# Patient Record
Sex: Female | Born: 1960 | Race: Black or African American | Hispanic: No | Marital: Single | Smoking: Never smoker
Health system: Southern US, Community
[De-identification: ages and names within clinical notes are randomized; demographics above are authoritative.]

## PROBLEM LIST (undated history)

## (undated) DIAGNOSIS — Z923 Personal history of irradiation: Secondary | ICD-10-CM

## (undated) DIAGNOSIS — T8859XA Other complications of anesthesia, initial encounter: Secondary | ICD-10-CM

## (undated) DIAGNOSIS — C801 Malignant (primary) neoplasm, unspecified: Secondary | ICD-10-CM

## (undated) DIAGNOSIS — R112 Nausea with vomiting, unspecified: Secondary | ICD-10-CM

## (undated) DIAGNOSIS — K219 Gastro-esophageal reflux disease without esophagitis: Secondary | ICD-10-CM

## (undated) DIAGNOSIS — I890 Lymphedema, not elsewhere classified: Secondary | ICD-10-CM

## (undated) DIAGNOSIS — R0600 Dyspnea, unspecified: Secondary | ICD-10-CM

## (undated) DIAGNOSIS — Z9221 Personal history of antineoplastic chemotherapy: Secondary | ICD-10-CM

## (undated) DIAGNOSIS — F32A Depression, unspecified: Secondary | ICD-10-CM

## (undated) DIAGNOSIS — G43909 Migraine, unspecified, not intractable, without status migrainosus: Secondary | ICD-10-CM

## (undated) DIAGNOSIS — C50912 Malignant neoplasm of unspecified site of left female breast: Secondary | ICD-10-CM

## (undated) DIAGNOSIS — T4145XA Adverse effect of unspecified anesthetic, initial encounter: Secondary | ICD-10-CM

## (undated) DIAGNOSIS — F419 Anxiety disorder, unspecified: Secondary | ICD-10-CM

## (undated) DIAGNOSIS — Z9889 Other specified postprocedural states: Secondary | ICD-10-CM

## (undated) DIAGNOSIS — G4730 Sleep apnea, unspecified: Secondary | ICD-10-CM

## (undated) DIAGNOSIS — T7840XA Allergy, unspecified, initial encounter: Secondary | ICD-10-CM

## (undated) DIAGNOSIS — I10 Essential (primary) hypertension: Secondary | ICD-10-CM

## (undated) DIAGNOSIS — G5603 Carpal tunnel syndrome, bilateral upper limbs: Secondary | ICD-10-CM

## (undated) HISTORY — PX: TUBAL LIGATION: SHX77

## (undated) HISTORY — PX: CARPAL TUNNEL RELEASE: SHX101

## (undated) HISTORY — PX: BREAST LUMPECTOMY: SHX2

## (undated) HISTORY — DX: Depression, unspecified: F32.A

## (undated) HISTORY — DX: Malignant neoplasm of unspecified site of left female breast: C50.912

## (undated) HISTORY — DX: Allergy, unspecified, initial encounter: T78.40XA

## (undated) HISTORY — DX: Anxiety disorder, unspecified: F41.9

## (undated) HISTORY — DX: Morbid (severe) obesity due to excess calories: E66.01

## (undated) HISTORY — PX: BREAST SURGERY: SHX581

## (undated) HISTORY — PX: COLONOSCOPY: SHX174

## (undated) HISTORY — DX: Essential (primary) hypertension: I10

---
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
## 1986-02-17 HISTORY — PX: TUBAL LIGATION: SHX77

## 2003-02-18 DIAGNOSIS — C801 Malignant (primary) neoplasm, unspecified: Secondary | ICD-10-CM

## 2003-02-18 HISTORY — PX: BREAST LUMPECTOMY: SHX2

## 2003-02-18 HISTORY — DX: Malignant (primary) neoplasm, unspecified: C80.1

## 2004-02-18 DIAGNOSIS — Z923 Personal history of irradiation: Secondary | ICD-10-CM

## 2004-02-18 HISTORY — DX: Personal history of irradiation: Z92.3

## 2016-12-26 ENCOUNTER — Emergency Department (HOSPITAL_COMMUNITY)
Admission: EM | Admit: 2016-12-26 | Discharge: 2016-12-26 | Disposition: A | Discharge status: Home / Self Care | Payer: Self-pay | Attending: Emergency Medicine | Admitting: Emergency Medicine

## 2016-12-26 ENCOUNTER — Encounter (HOSPITAL_COMMUNITY): Payer: Self-pay | Admitting: Emergency Medicine

## 2016-12-26 ENCOUNTER — Emergency Department (HOSPITAL_COMMUNITY): Payer: Self-pay

## 2016-12-26 ENCOUNTER — Other Ambulatory Visit: Payer: Self-pay

## 2016-12-26 DIAGNOSIS — Z79899 Other long term (current) drug therapy: Secondary | ICD-10-CM | POA: Insufficient documentation

## 2016-12-26 DIAGNOSIS — J111 Influenza due to unidentified influenza virus with other respiratory manifestations: Secondary | ICD-10-CM

## 2016-12-26 DIAGNOSIS — Z853 Personal history of malignant neoplasm of breast: Secondary | ICD-10-CM | POA: Insufficient documentation

## 2016-12-26 DIAGNOSIS — R69 Illness, unspecified: Secondary | ICD-10-CM | POA: Insufficient documentation

## 2016-12-26 DIAGNOSIS — J029 Acute pharyngitis, unspecified: Secondary | ICD-10-CM | POA: Insufficient documentation

## 2016-12-26 HISTORY — DX: Malignant (primary) neoplasm, unspecified: C80.1

## 2016-12-26 HISTORY — DX: Migraine, unspecified, not intractable, without status migrainosus: G43.909

## 2016-12-26 LAB — COMPREHENSIVE METABOLIC PANEL
ALT: 29 U/L (ref 14–54)
AST: 20 U/L (ref 15–41)
Albumin: 3.7 g/dL (ref 3.5–5.0)
Alkaline Phosphatase: 84 U/L (ref 38–126)
Anion gap: 8 (ref 5–15)
BUN: 10 mg/dL (ref 6–20)
CHLORIDE: 106 mmol/L (ref 101–111)
CO2: 25 mmol/L (ref 22–32)
Calcium: 9.1 mg/dL (ref 8.9–10.3)
Creatinine, Ser: 0.79 mg/dL (ref 0.44–1.00)
GFR calc Af Amer: >60 mL/min (ref >60)
GFR calc non Af Amer: >60 mL/min (ref >60)
GLUCOSE: 121 mg/dL — AB (ref 65–99)
Potassium: 3.4 mmol/L — ABNORMAL LOW (ref 3.5–5.1)
SODIUM: 139 mmol/L (ref 135–145)
Total Bilirubin: 0.9 mg/dL (ref 0.3–1.2)
Total Protein: 7.7 g/dL (ref 6.5–8.1)

## 2016-12-26 LAB — CBC WITH DIFFERENTIAL/PLATELET
Basophils Absolute: 0 10*3/uL (ref 0.0–0.1)
Basophils Relative: 0 %
EOS PCT: 0 %
Eosinophils Absolute: 0 10*3/uL (ref 0.0–0.7)
HCT: 39.4 % (ref 36.0–46.0)
Hemoglobin: 13.3 g/dL (ref 12.0–15.0)
LYMPHS ABS: 1.3 10*3/uL (ref 0.7–4.0)
Lymphocytes Relative: 7 %
MCH: 29.4 pg (ref 26.0–34.0)
MCHC: 33.8 g/dL (ref 30.0–36.0)
MCV: 87.2 fL (ref 78.0–100.0)
MONO ABS: 1.2 10*3/uL — AB (ref 0.1–1.0)
MONOS PCT: 6 %
Neutro Abs: 16.3 10*3/uL — ABNORMAL HIGH (ref 1.7–7.7)
Neutrophils Relative %: 87 %
PLATELETS: 165 10*3/uL (ref 150–400)
RBC: 4.52 MIL/uL (ref 3.87–5.11)
RDW: 13.6 % (ref 11.5–15.5)
WBC: 18.8 10*3/uL — ABNORMAL HIGH (ref 4.0–10.5)

## 2016-12-26 LAB — URINALYSIS, ROUTINE W REFLEX MICROSCOPIC
Bilirubin Urine: NEGATIVE
Glucose, UA: NEGATIVE mg/dL
KETONES UR: 5 mg/dL — AB
Leukocytes, UA: NEGATIVE
Nitrite: NEGATIVE
Protein, ur: 30 mg/dL — AB
SPECIFIC GRAVITY, URINE: 1.025 (ref 1.005–1.030)
pH: 5 (ref 5.0–8.0)

## 2016-12-26 LAB — I-STAT CG4 LACTIC ACID, ED: LACTIC ACID, VENOUS: 0.69 mmol/L (ref 0.5–1.9)

## 2016-12-26 MED ORDER — CLINDAMYCIN HCL 300 MG PO CAPS: 300.0000 mg | ORAL_CAPSULE | Freq: Three times a day (TID) | ORAL | 0 refills | Status: AC

## 2016-12-26 MED ORDER — SODIUM CHLORIDE 0.9 % IV BOLUS (SEPSIS)
1000.0000 mL | Freq: Once | INTRAVENOUS | Status: AC
  Administered 2016-12-26: 1000 mL via INTRAVENOUS

## 2016-12-26 MED ORDER — KETOROLAC TROMETHAMINE 15 MG/ML IJ SOLN
15.0000 mg | Freq: Once | INTRAMUSCULAR | Status: AC
  Administered 2016-12-26: 15 mg via INTRAVENOUS
  Filled 2016-12-26: qty 1

## 2016-12-26 MED ORDER — HYDROCODONE-ACETAMINOPHEN 7.5-325 MG/15ML PO SOLN: 10.0000 mL | Freq: Four times a day (QID) | ORAL | 0 refills | Status: AC | PRN

## 2016-12-26 MED ORDER — DEXAMETHASONE SODIUM PHOSPHATE 10 MG/ML IJ SOLN
10.0000 mg | Freq: Once | INTRAMUSCULAR | Status: AC
  Administered 2016-12-26: 10 mg via INTRAVENOUS
  Filled 2016-12-26: qty 1

## 2016-12-26 MED ORDER — ONDANSETRON 4 MG PO TBDP: 4.0000 mg | ORAL_TABLET | Freq: Three times a day (TID) | ORAL | 0 refills | Status: DC | PRN

## 2016-12-26 MED ORDER — IBUPROFEN 400 MG PO TABS: 400.0000 mg | ORAL_TABLET | Freq: Three times a day (TID) | ORAL | 0 refills | Status: DC | PRN

## 2016-12-26 MED ORDER — POTASSIUM CHLORIDE CRYS ER 20 MEQ PO TBCR
40.0000 meq | EXTENDED_RELEASE_TABLET | Freq: Once | ORAL | Status: AC
  Administered 2016-12-26: 40 meq via ORAL
  Filled 2016-12-26: qty 2

## 2016-12-26 MED ORDER — CLINDAMYCIN PHOSPHATE 900 MG/50ML IV SOLN
900.0000 mg | Freq: Once | INTRAVENOUS | Status: AC
  Administered 2016-12-26: 900 mg via INTRAVENOUS
  Filled 2016-12-26: qty 50

## 2016-12-26 MED ORDER — ACETAMINOPHEN 500 MG PO TABS
1000.0000 mg | ORAL_TABLET | Freq: Once | ORAL | Status: AC
  Administered 2016-12-26: 1000 mg via ORAL
  Filled 2016-12-26: qty 2

## 2016-12-26 NOTE — ED Provider Notes (Signed)
[facility] DEPT Provider Note   CSN: [id number] Arrival date & time: 12/26/16  0709     History   Chief Complaint Chief Complaint  Patient presents with  . Generalized Body Aches    HPI Lauren Garrison is a 56 y.o. female.  67 y       56 yo F with no significant PMHx here with generalized body aches. Pt reports that her sx started 2-3 days ago with mild sore throat and diffuse aching, cramping body aches in b/l UE and LE. Since then, she's had persistent intermittent myalgias, nausea, sore throat, and fevers with chills. She's had no vomiting. She reports severe, bilateral sore throat but denies any hoarseness, difficulty opening her mouth, or difficulty swallowing. She is vaccinated and o/w healthy. No h/o diabetes. No known sick contacts but she works in a [employer] so is around many people. Has not taken anything today.  Past Medical History:  Diagnosis Date  . Cancer ([place])    breast  . Migraine     There are no active problems to display for this patient.   History reviewed. No pertinent surgical history.  OB History    No data available       Home Medications    Prior to Admission medications   Medication Sig Start Date End Date Taking? Authorizing Provider  acetaminophen (TYLENOL) 325 MG tablet Take 650 mg every 6 (six) hours as needed by mouth for fever.   Yes [provider]  magnesium gluconate (MAGONATE) 500 MG tablet Take 500 mg daily by mouth.   Yes [provider]  clindamycin (CLEOCIN) 300 MG capsule Take 1 capsule (300 mg total) 3 (three) times daily for 10 days by mouth. 12/26/16 01/05/17  Duffy Bruce, MD  HYDROcodone-acetaminophen (HYCET) 7.5-325 mg/15 ml solution Take 10 mLs 4 (four) times daily as needed by mouth for severe pain. 12/26/16 12/26/17  Duffy Bruce, MD  ibuprofen (ADVIL,MOTRIN) 400 MG tablet Take 1 tablet (400 mg total) every 8 (eight) hours as needed by mouth for  moderate pain. 12/26/16   Duffy Bruce, MD  ondansetron (ZOFRAN ODT) 4 MG disintegrating tablet Take 1 tablet (4 mg total) every 8 (eight) hours as needed by mouth for nausea or vomiting. 12/26/16   Duffy Bruce, MD    Family History No family history on file.  Social History Social History   Tobacco Use  . Smoking status: Never Smoker  Substance Use Topics  . Alcohol use: No    Frequency: Never  . Drug use: No     Allergies   Sulfa antibiotics   Review of Systems Review of Systems  Constitutional: Positive for fatigue.  HENT: Positive for sore throat.   Musculoskeletal: Positive for arthralgias and myalgias.  Neurological: Positive for weakness.  All other systems reviewed and are negative.    Physical Exam Updated Vital Signs BP (!) 146/77 (BP Location: Left Arm)   Pulse 69   Temp 99.6 F (37.6 C) (Oral)   Resp 18   Ht 5\' 5"  (1.651 m)   Wt 77.1 kg (170 lb)   SpO2 98%   BMI 28.29 kg/m   Physical Exam  Constitutional: She is oriented to person, place, and time. She appears well-developed and well-nourished. No distress.  HENT:  Head: Normocephalic and atraumatic.  3+ tonsillar swelling b/l with exudates. No peritonsillar asymmetry or edema. No uvular deviation or swelling. No pooling of secretions. No trismus.  Eyes: Conjunctivae are normal. Pupils are  equal, round, and reactive to light.  Neck: Neck supple.  No meningismus.  Cardiovascular: Regular rhythm and normal heart sounds. Tachycardia present. Exam reveals no friction rub.  No murmur heard. Pulmonary/Chest: Effort normal and breath sounds normal. No respiratory distress. She has no wheezes. She has no rales.  Abdominal: Soft. She exhibits no distension.  Musculoskeletal: She exhibits no edema.  Neurological: She is alert and oriented to person, place, and time. She exhibits normal muscle tone.  Skin: Skin is warm. Capillary refill takes less than 2 seconds. No rash noted.  Psychiatric: She has  a normal mood and affect.  Nursing note and vitals reviewed.    ED Treatments / Results  Labs (all labs ordered are listed, but only abnormal results are displayed) Labs Reviewed  COMPREHENSIVE METABOLIC PANEL - Abnormal; Notable for the following components:      Result Value   Potassium 3.4 (*)    Glucose, Bld 121 (*)    All other components within normal limits  CBC WITH DIFFERENTIAL/PLATELET - Abnormal; Notable for the following components:   WBC 18.8 (*)    Neutro Abs 16.3 (*)    Monocytes Absolute 1.2 (*)    All other components within normal limits  URINALYSIS, ROUTINE W REFLEX MICROSCOPIC - Abnormal; Notable for the following components:   Hgb urine dipstick SMALL (*)    Ketones, ur 5 (*)    Protein, ur 30 (*)    Bacteria, UA RARE (*)    Squamous Epithelial / LPF 0-5 (*)    All other components within normal limits  I-STAT CG4 LACTIC ACID, ED    EKG  EKG Interpretation None       Radiology Dg Chest 2 View  Result Date: 12/26/2016 CLINICAL DATA:  Nausea and vomiting with cough and fever as well as sore throat and body aches since yesterday. EXAM: CHEST  2 VIEW COMPARISON:  None. FINDINGS: Lungs are adequately inflated without focal airspace consolidation or effusion. Cardiomediastinal silhouette is within normal. Minimal degenerate change of the spine. Surgical clips over the left breast/axilla with findings suggesting previous left lumpectomy. IMPRESSION: No active cardiopulmonary disease. Electronically Signed   By: Marin Olp M.D.   On: 12/26/2016 07:44    Procedures Procedures (including critical care time)  Medications Ordered in ED Medications  sodium chloride 0.9 % bolus 1,000 mL (0 mLs Intravenous Stopped 12/26/16 1102)  ketorolac (TORADOL) 15 MG/ML injection 15 mg (15 mg Intravenous Given 12/26/16 0954)  dexamethasone (DECADRON) injection 10 mg (10 mg Intravenous Given 12/26/16 0954)  clindamycin (CLEOCIN) IVPB 900 mg (0 mg Intravenous Stopped 12/26/16  1102)  acetaminophen (TYLENOL) tablet 1,000 mg (1,000 mg Oral Given 12/26/16 1104)  sodium chloride 0.9 % bolus 1,000 mL (0 mLs Intravenous Stopped 12/26/16 1231)  potassium chloride SA (K-DUR,KLOR-CON) CR tablet 40 mEq (40 mEq Oral Given 12/26/16 1333)     Initial Impression / Assessment and Plan / ED Course  I have reviewed the triage vital signs and the nursing notes.  Pertinent labs & imaging results that were available during my care of the patient were reviewed by me and considered in my medical decision making (see chart for details).     56 yo F here with fever, sore throat, myalgias and body aches. Lab work shows leukocytosis, dehydration but CXR clear, UA without UTI. Abdomen soft, NT, ND, without focal TTP to suggest chole, appendicitis, or other intra-abdominal emergency. Suspect viral URI with concern for bacterial pharyngitis. IV Clinda given empirically with IVF,  decadron, toradol. PT has no signs of PTA. No drooling, neck stiffness, or s/s RPA or epiglottitis. She feels markedly improved with tx and VS remain stable, and she is tolerating PO. Will give clinda, supportive care, and d/c home.  Final Clinical Impressions(s) / ED Diagnoses   Final diagnoses:  Influenza-like illness  Pharyngitis, unspecified etiology    ED Discharge Orders        Ordered    ondansetron (ZOFRAN ODT) 4 MG disintegrating tablet  Every 8 hours PRN     12/26/16 1341    ibuprofen (ADVIL,MOTRIN) 400 MG tablet  Every 8 hours PRN     12/26/16 1341    clindamycin (CLEOCIN) 300 MG capsule  3 times daily     12/26/16 1341    HYDROcodone-acetaminophen (HYCET) 7.5-325 mg/15 ml solution  4 times daily PRN     12/26/16 1341       Duffy Bruce, MD 12/26/16 1939

## 2016-12-26 NOTE — ED Triage Notes (Signed)
Pt complaint of n/v, cough, fever, sore throat, and body aches onset yesterday.

## 2016-12-26 NOTE — ED Notes (Signed)
Patient tolerating water without any difficulties while sipping with PO medications.

## 2019-02-18 DIAGNOSIS — C50919 Malignant neoplasm of unspecified site of unspecified female breast: Secondary | ICD-10-CM

## 2019-02-18 HISTORY — DX: Malignant neoplasm of unspecified site of unspecified female breast: C50.919

## 2019-02-18 HISTORY — PX: COLONOSCOPY: SHX174

## 2019-03-02 ENCOUNTER — Inpatient Hospital Stay (HOSPITAL_COMMUNITY)
Admission: EM | Admit: 2019-03-02 | Discharge: 2019-03-15 | DRG: 481 | Disposition: A | Discharge status: Skilled Nursing Facility | Payer: 59 | Attending: Orthopedic Surgery | Admitting: Orthopedic Surgery

## 2019-03-02 ENCOUNTER — Encounter (HOSPITAL_COMMUNITY): Payer: Self-pay | Admitting: *Deleted

## 2019-03-02 ENCOUNTER — Other Ambulatory Visit: Payer: Self-pay

## 2019-03-02 ENCOUNTER — Emergency Department (HOSPITAL_COMMUNITY): Payer: 59

## 2019-03-02 DIAGNOSIS — D259 Leiomyoma of uterus, unspecified: Secondary | ICD-10-CM | POA: Diagnosis present

## 2019-03-02 DIAGNOSIS — K760 Fatty (change of) liver, not elsewhere classified: Secondary | ICD-10-CM | POA: Diagnosis present

## 2019-03-02 DIAGNOSIS — Z882 Allergy status to sulfonamides status: Secondary | ICD-10-CM

## 2019-03-02 DIAGNOSIS — Y92410 Unspecified street and highway as the place of occurrence of the external cause: Secondary | ICD-10-CM

## 2019-03-02 DIAGNOSIS — N6320 Unspecified lump in the left breast, unspecified quadrant: Secondary | ICD-10-CM

## 2019-03-02 DIAGNOSIS — E669 Obesity, unspecified: Secondary | ICD-10-CM | POA: Diagnosis present

## 2019-03-02 DIAGNOSIS — S72462A Displaced supracondylar fracture with intracondylar extension of lower end of left femur, initial encounter for closed fracture: Secondary | ICD-10-CM | POA: Diagnosis not present

## 2019-03-02 DIAGNOSIS — M47812 Spondylosis without myelopathy or radiculopathy, cervical region: Secondary | ICD-10-CM | POA: Diagnosis present

## 2019-03-02 DIAGNOSIS — N63 Unspecified lump in unspecified breast: Secondary | ICD-10-CM | POA: Diagnosis present

## 2019-03-02 DIAGNOSIS — S32059A Unspecified fracture of fifth lumbar vertebra, initial encounter for closed fracture: Secondary | ICD-10-CM | POA: Diagnosis present

## 2019-03-02 DIAGNOSIS — T148XXA Other injury of unspecified body region, initial encounter: Secondary | ICD-10-CM

## 2019-03-02 DIAGNOSIS — R0602 Shortness of breath: Secondary | ICD-10-CM | POA: Diagnosis not present

## 2019-03-02 DIAGNOSIS — Z791 Long term (current) use of non-steroidal anti-inflammatories (NSAID): Secondary | ICD-10-CM

## 2019-03-02 DIAGNOSIS — E559 Vitamin D deficiency, unspecified: Secondary | ICD-10-CM | POA: Diagnosis present

## 2019-03-02 DIAGNOSIS — T45515A Adverse effect of anticoagulants, initial encounter: Secondary | ICD-10-CM | POA: Diagnosis not present

## 2019-03-02 DIAGNOSIS — G43909 Migraine, unspecified, not intractable, without status migrainosus: Secondary | ICD-10-CM | POA: Diagnosis present

## 2019-03-02 DIAGNOSIS — Z79899 Other long term (current) drug therapy: Secondary | ICD-10-CM

## 2019-03-02 DIAGNOSIS — R0789 Other chest pain: Secondary | ICD-10-CM | POA: Diagnosis not present

## 2019-03-02 DIAGNOSIS — M4316 Spondylolisthesis, lumbar region: Secondary | ICD-10-CM | POA: Diagnosis present

## 2019-03-02 DIAGNOSIS — Z853 Personal history of malignant neoplasm of breast: Secondary | ICD-10-CM

## 2019-03-02 DIAGNOSIS — D62 Acute posthemorrhagic anemia: Secondary | ICD-10-CM | POA: Diagnosis not present

## 2019-03-02 DIAGNOSIS — S7292XA Unspecified fracture of left femur, initial encounter for closed fracture: Secondary | ICD-10-CM

## 2019-03-02 DIAGNOSIS — Z20822 Contact with and (suspected) exposure to covid-19: Secondary | ICD-10-CM | POA: Diagnosis present

## 2019-03-02 DIAGNOSIS — Z6836 Body mass index (BMI) 36.0-36.9, adult: Secondary | ICD-10-CM

## 2019-03-02 DIAGNOSIS — M48061 Spinal stenosis, lumbar region without neurogenic claudication: Secondary | ICD-10-CM | POA: Diagnosis present

## 2019-03-02 DIAGNOSIS — M1712 Unilateral primary osteoarthritis, left knee: Secondary | ICD-10-CM | POA: Diagnosis present

## 2019-03-02 DIAGNOSIS — F419 Anxiety disorder, unspecified: Secondary | ICD-10-CM | POA: Diagnosis present

## 2019-03-02 DIAGNOSIS — Y92239 Unspecified place in hospital as the place of occurrence of the external cause: Secondary | ICD-10-CM | POA: Diagnosis not present

## 2019-03-02 DIAGNOSIS — M47816 Spondylosis without myelopathy or radiculopathy, lumbar region: Secondary | ICD-10-CM | POA: Diagnosis present

## 2019-03-02 DIAGNOSIS — E8889 Other specified metabolic disorders: Secondary | ICD-10-CM | POA: Diagnosis present

## 2019-03-02 DIAGNOSIS — S72402A Unspecified fracture of lower end of left femur, initial encounter for closed fracture: Secondary | ICD-10-CM

## 2019-03-02 DIAGNOSIS — S82142A Displaced bicondylar fracture of left tibia, initial encounter for closed fracture: Secondary | ICD-10-CM

## 2019-03-02 HISTORY — DX: Other complications of anesthesia, initial encounter: T88.59XA

## 2019-03-02 HISTORY — DX: Other specified postprocedural states: R11.2

## 2019-03-02 HISTORY — DX: Other specified postprocedural states: Z98.890

## 2019-03-02 LAB — CBC
HCT: 42 % (ref 36.0–46.0)
Hemoglobin: 13.4 g/dL (ref 12.0–15.0)
MCH: 29.5 pg (ref 26.0–34.0)
MCHC: 31.9 g/dL (ref 30.0–36.0)
MCV: 92.5 fL (ref 80.0–100.0)
Platelets: 188 10*3/uL (ref 150–400)
RBC: 4.54 MIL/uL (ref 3.87–5.11)
RDW: 14.6 % (ref 11.5–15.5)
WBC: 10.6 10*3/uL — ABNORMAL HIGH (ref 4.0–10.5)
nRBC: 0 % (ref 0.0–0.2)

## 2019-03-02 LAB — I-STAT CHEM 8, ED
BUN: 13 mg/dL (ref 6–20)
Calcium, Ion: 1.09 mmol/L — ABNORMAL LOW (ref 1.15–1.40)
Chloride: 101 mmol/L (ref 98–111)
Creatinine, Ser: 0.9 mg/dL (ref 0.44–1.00)
Glucose, Bld: 152 mg/dL — ABNORMAL HIGH (ref 70–99)
HCT: 43 % (ref 36.0–46.0)
Hemoglobin: 14.6 g/dL (ref 12.0–15.0)
Potassium: 3.4 mmol/L — ABNORMAL LOW (ref 3.5–5.1)
Sodium: 140 mmol/L (ref 135–145)
TCO2: 28 mmol/L (ref 22–32)

## 2019-03-02 LAB — I-STAT BETA HCG BLOOD, ED (MC, WL, AP ONLY): I-stat hCG, quantitative: <5 m[IU]/mL (ref <5)

## 2019-03-02 LAB — PROTIME-INR
INR: 1 (ref 0.8–1.2)
Prothrombin Time: 13 seconds (ref 11.4–15.2)

## 2019-03-02 LAB — SAMPLE TO BLOOD BANK

## 2019-03-02 MED ORDER — FENTANYL CITRATE (PF) 100 MCG/2ML IJ SOLN
INTRAMUSCULAR | Status: AC
  Administered 2019-03-02: 23:00:00 50 ug via INTRAVENOUS
  Filled 2019-03-02: qty 2

## 2019-03-02 MED ORDER — FENTANYL CITRATE (PF) 100 MCG/2ML IJ SOLN: 50.0000 ug | Freq: Once | INTRAMUSCULAR | Status: AC
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
## 2019-03-02 NOTE — ED Notes (Signed)
Patient transported to X-ray 
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
## 2019-03-03 ENCOUNTER — Encounter (HOSPITAL_COMMUNITY)
Admission: EM | Disposition: A | Discharge status: Skilled Nursing Facility | Payer: Self-pay | Source: Home / Self Care | Attending: Orthopedic Surgery

## 2019-03-03 ENCOUNTER — Inpatient Hospital Stay (HOSPITAL_COMMUNITY): Payer: 59 | Admitting: Certified Registered"

## 2019-03-03 ENCOUNTER — Inpatient Hospital Stay (HOSPITAL_COMMUNITY): Payer: 59

## 2019-03-03 ENCOUNTER — Emergency Department (HOSPITAL_COMMUNITY): Payer: 59

## 2019-03-03 ENCOUNTER — Encounter (HOSPITAL_COMMUNITY): Payer: Self-pay | Admitting: Student

## 2019-03-03 DIAGNOSIS — F419 Anxiety disorder, unspecified: Secondary | ICD-10-CM | POA: Diagnosis present

## 2019-03-03 DIAGNOSIS — S72462A Displaced supracondylar fracture with intracondylar extension of lower end of left femur, initial encounter for closed fracture: Secondary | ICD-10-CM | POA: Diagnosis present

## 2019-03-03 DIAGNOSIS — Z6836 Body mass index (BMI) 36.0-36.9, adult: Secondary | ICD-10-CM | POA: Diagnosis not present

## 2019-03-03 DIAGNOSIS — E559 Vitamin D deficiency, unspecified: Secondary | ICD-10-CM | POA: Diagnosis present

## 2019-03-03 DIAGNOSIS — T45515A Adverse effect of anticoagulants, initial encounter: Secondary | ICD-10-CM | POA: Diagnosis not present

## 2019-03-03 DIAGNOSIS — R0789 Other chest pain: Secondary | ICD-10-CM | POA: Diagnosis not present

## 2019-03-03 DIAGNOSIS — G43909 Migraine, unspecified, not intractable, without status migrainosus: Secondary | ICD-10-CM | POA: Diagnosis present

## 2019-03-03 DIAGNOSIS — E8889 Other specified metabolic disorders: Secondary | ICD-10-CM | POA: Diagnosis present

## 2019-03-03 DIAGNOSIS — M4316 Spondylolisthesis, lumbar region: Secondary | ICD-10-CM | POA: Diagnosis present

## 2019-03-03 DIAGNOSIS — Z79899 Other long term (current) drug therapy: Secondary | ICD-10-CM | POA: Diagnosis not present

## 2019-03-03 DIAGNOSIS — Z791 Long term (current) use of non-steroidal anti-inflammatories (NSAID): Secondary | ICD-10-CM | POA: Diagnosis not present

## 2019-03-03 DIAGNOSIS — Y92239 Unspecified place in hospital as the place of occurrence of the external cause: Secondary | ICD-10-CM | POA: Diagnosis not present

## 2019-03-03 DIAGNOSIS — M47816 Spondylosis without myelopathy or radiculopathy, lumbar region: Secondary | ICD-10-CM | POA: Diagnosis present

## 2019-03-03 DIAGNOSIS — Y92410 Unspecified street and highway as the place of occurrence of the external cause: Secondary | ICD-10-CM | POA: Diagnosis not present

## 2019-03-03 DIAGNOSIS — R0602 Shortness of breath: Secondary | ICD-10-CM | POA: Diagnosis not present

## 2019-03-03 DIAGNOSIS — K760 Fatty (change of) liver, not elsewhere classified: Secondary | ICD-10-CM | POA: Diagnosis present

## 2019-03-03 DIAGNOSIS — Z20822 Contact with and (suspected) exposure to covid-19: Secondary | ICD-10-CM | POA: Diagnosis present

## 2019-03-03 DIAGNOSIS — S32059A Unspecified fracture of fifth lumbar vertebra, initial encounter for closed fracture: Secondary | ICD-10-CM | POA: Diagnosis present

## 2019-03-03 DIAGNOSIS — M48061 Spinal stenosis, lumbar region without neurogenic claudication: Secondary | ICD-10-CM | POA: Diagnosis present

## 2019-03-03 DIAGNOSIS — M47812 Spondylosis without myelopathy or radiculopathy, cervical region: Secondary | ICD-10-CM | POA: Diagnosis present

## 2019-03-03 DIAGNOSIS — Z853 Personal history of malignant neoplasm of breast: Secondary | ICD-10-CM | POA: Diagnosis not present

## 2019-03-03 DIAGNOSIS — Z882 Allergy status to sulfonamides status: Secondary | ICD-10-CM | POA: Diagnosis not present

## 2019-03-03 DIAGNOSIS — D259 Leiomyoma of uterus, unspecified: Secondary | ICD-10-CM | POA: Diagnosis present

## 2019-03-03 DIAGNOSIS — E669 Obesity, unspecified: Secondary | ICD-10-CM | POA: Diagnosis present

## 2019-03-03 DIAGNOSIS — D62 Acute posthemorrhagic anemia: Secondary | ICD-10-CM | POA: Diagnosis not present

## 2019-03-03 DIAGNOSIS — M1712 Unilateral primary osteoarthritis, left knee: Secondary | ICD-10-CM | POA: Diagnosis present

## 2019-03-03 HISTORY — PX: ORIF FEMUR FRACTURE: SHX2119

## 2019-03-03 LAB — COMPREHENSIVE METABOLIC PANEL
ALT: 46 U/L — ABNORMAL HIGH (ref 0–44)
AST: 42 U/L — ABNORMAL HIGH (ref 15–41)
Albumin: 3.3 g/dL — ABNORMAL LOW (ref 3.5–5.0)
Alkaline Phosphatase: 90 U/L (ref 38–126)
Anion gap: 13 (ref 5–15)
BUN: 11 mg/dL (ref 6–20)
CO2: 25 mmol/L (ref 22–32)
Calcium: 8.8 mg/dL — ABNORMAL LOW (ref 8.9–10.3)
Chloride: 102 mmol/L (ref 98–111)
Creatinine, Ser: 1 mg/dL (ref 0.44–1.00)
GFR calc Af Amer: >60 mL/min (ref >60)
GFR calc non Af Amer: >60 mL/min (ref >60)
Glucose, Bld: 159 mg/dL — ABNORMAL HIGH (ref 70–99)
Potassium: 3.5 mmol/L (ref 3.5–5.1)
Sodium: 140 mmol/L (ref 135–145)
Total Bilirubin: 0.8 mg/dL (ref 0.3–1.2)
Total Protein: 6.8 g/dL (ref 6.5–8.1)

## 2019-03-03 LAB — RESPIRATORY PANEL BY RT PCR (FLU A&B, COVID)
Influenza A by PCR: NEGATIVE
Influenza B by PCR: NEGATIVE
SARS Coronavirus 2 by RT PCR: NEGATIVE

## 2019-03-03 LAB — CBC
HCT: 34 % — ABNORMAL LOW (ref 36.0–46.0)
Hemoglobin: 10.8 g/dL — ABNORMAL LOW (ref 12.0–15.0)
MCH: 29.2 pg (ref 26.0–34.0)
MCHC: 31.8 g/dL (ref 30.0–36.0)
MCV: 91.9 fL (ref 80.0–100.0)
Platelets: 156 10*3/uL (ref 150–400)
RBC: 3.7 MIL/uL — ABNORMAL LOW (ref 3.87–5.11)
RDW: 14.7 % (ref 11.5–15.5)
WBC: 10.1 10*3/uL (ref 4.0–10.5)
nRBC: 0 % (ref 0.0–0.2)

## 2019-03-03 LAB — CREATININE, SERUM
Creatinine, Ser: 0.89 mg/dL (ref 0.44–1.00)
GFR calc Af Amer: >60 mL/min (ref >60)
GFR calc non Af Amer: >60 mL/min (ref >60)

## 2019-03-03 LAB — SURGICAL PCR SCREEN
MRSA, PCR: NEGATIVE
Staphylococcus aureus: POSITIVE — AB

## 2019-03-03 LAB — ETHANOL: Alcohol, Ethyl (B): <10 mg/dL (ref <10)

## 2019-03-03 LAB — CDS SEROLOGY

## 2019-03-03 LAB — LACTIC ACID, PLASMA: Lactic Acid, Venous: 3.2 mmol/L (ref 0.5–1.9)

## 2019-03-03 SURGERY — OPEN REDUCTION INTERNAL FIXATION (ORIF) DISTAL FEMUR FRACTURE
Anesthesia: General | Laterality: Left

## 2019-03-03 MED ORDER — MIDAZOLAM HCL 2 MG/2ML IJ SOLN
INTRAMUSCULAR | Status: AC
  Filled 2019-03-03: qty 2

## 2019-03-03 MED ORDER — FENTANYL CITRATE (PF) 100 MCG/2ML IJ SOLN: 50.0000 ug | Freq: Once | INTRAMUSCULAR | Status: DC

## 2019-03-03 MED ORDER — ROCURONIUM BROMIDE 10 MG/ML (PF) SYRINGE
PREFILLED_SYRINGE | INTRAVENOUS | Status: AC
  Filled 2019-03-03: qty 10

## 2019-03-03 MED ORDER — LIDOCAINE 2% (20 MG/ML) 5 ML SYRINGE
INTRAMUSCULAR | Status: AC
  Filled 2019-03-03: qty 5

## 2019-03-03 MED ORDER — HYDROMORPHONE HCL 1 MG/ML IJ SOLN
INTRAMUSCULAR | Status: AC
  Administered 2019-03-03: 02:00:00 1 mg via INTRAVENOUS
  Filled 2019-03-03: qty 1

## 2019-03-03 MED ORDER — FENTANYL CITRATE (PF) 100 MCG/2ML IJ SOLN
INTRAMUSCULAR | Status: AC
  Filled 2019-03-03: qty 2

## 2019-03-03 MED ORDER — LACTATED RINGERS IV SOLN: INTRAVENOUS | Status: DC | PRN

## 2019-03-03 MED ORDER — ACETAMINOPHEN 325 MG PO TABS
650.0000 mg | ORAL_TABLET | Freq: Four times a day (QID) | ORAL | Status: AC
  Administered 2019-03-03 – 2019-03-04 (×4): 650 mg via ORAL
  Filled 2019-03-03 (×4): qty 2

## 2019-03-03 MED ORDER — POTASSIUM CHLORIDE IN NACL 20-0.9 MEQ/L-% IV SOLN
INTRAVENOUS | Status: DC
  Filled 2019-03-03 (×2): qty 1000

## 2019-03-03 MED ORDER — ONDANSETRON HCL 4 MG PO TABS: 4.0000 mg | ORAL_TABLET | Freq: Four times a day (QID) | ORAL | Status: DC | PRN

## 2019-03-03 MED ORDER — CEFAZOLIN SODIUM-DEXTROSE 2-4 GM/100ML-% IV SOLN
2.0000 g | Freq: Four times a day (QID) | INTRAVENOUS | Status: AC
  Administered 2019-03-03 – 2019-03-04 (×3): 2 g via INTRAVENOUS
  Filled 2019-03-03 (×4): qty 100

## 2019-03-03 MED ORDER — PROPOFOL 10 MG/ML IV BOLUS
INTRAVENOUS | Status: DC | PRN
  Administered 2019-03-03: 200 mg via INTRAVENOUS

## 2019-03-03 MED ORDER — 0.9 % SODIUM CHLORIDE (POUR BTL) OPTIME
TOPICAL | Status: DC | PRN
  Administered 2019-03-03: 1000 mL

## 2019-03-03 MED ORDER — ENOXAPARIN SODIUM 60 MG/0.6ML ~~LOC~~ SOLN
0.5000 mg/kg | SUBCUTANEOUS | Status: DC
  Administered 2019-03-04 – 2019-03-09 (×6): 50 mg via SUBCUTANEOUS
  Filled 2019-03-03 (×7): qty 0.6

## 2019-03-03 MED ORDER — OXYCODONE HCL 5 MG/5ML PO SOLN: 5.0000 mg | Freq: Once | ORAL | Status: AC | PRN

## 2019-03-03 MED ORDER — METOCLOPRAMIDE HCL 5 MG/ML IJ SOLN: 5.0000 mg | Freq: Three times a day (TID) | INTRAMUSCULAR | Status: DC | PRN

## 2019-03-03 MED ORDER — ONDANSETRON HCL 4 MG/2ML IJ SOLN: 4.0000 mg | Freq: Four times a day (QID) | INTRAMUSCULAR | Status: DC | PRN

## 2019-03-03 MED ORDER — CEFAZOLIN SODIUM-DEXTROSE 2-3 GM-%(50ML) IV SOLR
INTRAVENOUS | Status: DC | PRN
  Administered 2019-03-03: 2 g via INTRAVENOUS

## 2019-03-03 MED ORDER — DIPHENHYDRAMINE HCL 12.5 MG/5ML PO ELIX: 12.5000 mg | ORAL_SOLUTION | ORAL | Status: DC | PRN

## 2019-03-03 MED ORDER — ASCORBIC ACID 500 MG PO TABS
500.0000 mg | ORAL_TABLET | Freq: Every day | ORAL | Status: DC
  Administered 2019-03-04 – 2019-03-15 (×12): 500 mg via ORAL
  Filled 2019-03-03 (×12): qty 1

## 2019-03-03 MED ORDER — DEXAMETHASONE SODIUM PHOSPHATE 10 MG/ML IJ SOLN
INTRAMUSCULAR | Status: DC | PRN
  Administered 2019-03-03: 10 mg via INTRAVENOUS

## 2019-03-03 MED ORDER — HYDROMORPHONE HCL 1 MG/ML IJ SOLN
0.5000 mg | INTRAMUSCULAR | Status: DC | PRN
  Administered 2019-03-03: 1 mg via INTRAVENOUS
  Filled 2019-03-03: qty 1

## 2019-03-03 MED ORDER — METHOCARBAMOL 500 MG PO TABS
500.0000 mg | ORAL_TABLET | Freq: Four times a day (QID) | ORAL | Status: DC | PRN
  Administered 2019-03-03: 05:00:00 500 mg via ORAL
  Filled 2019-03-03: qty 1

## 2019-03-03 MED ORDER — SUGAMMADEX SODIUM 200 MG/2ML IV SOLN
INTRAVENOUS | Status: DC | PRN
  Administered 2019-03-03 (×2): 200 mg via INTRAVENOUS

## 2019-03-03 MED ORDER — PHENYLEPHRINE HCL-NACL 10-0.9 MG/250ML-% IV SOLN
INTRAVENOUS | Status: DC | PRN
  Administered 2019-03-03: 30 ug/min via INTRAVENOUS

## 2019-03-03 MED ORDER — OXYCODONE HCL 5 MG PO TABS
5.0000 mg | ORAL_TABLET | ORAL | Status: DC | PRN
  Administered 2019-03-03: 05:00:00 5 mg via ORAL
  Filled 2019-03-03: qty 2

## 2019-03-03 MED ORDER — PHENYLEPHRINE 40 MCG/ML (10ML) SYRINGE FOR IV PUSH (FOR BLOOD PRESSURE SUPPORT)
PREFILLED_SYRINGE | INTRAVENOUS | Status: DC | PRN
  Administered 2019-03-03 (×2): 120 ug via INTRAVENOUS

## 2019-03-03 MED ORDER — ONDANSETRON HCL 4 MG/2ML IJ SOLN
INTRAMUSCULAR | Status: DC | PRN
  Administered 2019-03-03: 4 mg via INTRAVENOUS

## 2019-03-03 MED ORDER — FENTANYL CITRATE (PF) 100 MCG/2ML IJ SOLN
50.0000 ug | Freq: Once | INTRAMUSCULAR | Status: AC
  Administered 2019-03-03: 01:00:00 50 ug via INTRAVENOUS
  Filled 2019-03-03: qty 2

## 2019-03-03 MED ORDER — OXYCODONE HCL 5 MG PO TABS
5.0000 mg | ORAL_TABLET | ORAL | Status: DC | PRN
  Administered 2019-03-05: 10 mg via ORAL
  Administered 2019-03-05: 5 mg via ORAL
  Administered 2019-03-05 – 2019-03-06 (×3): 10 mg via ORAL
  Administered 2019-03-07: 5 mg via ORAL
  Administered 2019-03-07 – 2019-03-11 (×6): 10 mg via ORAL
  Administered 2019-03-12: 14:00:00 5 mg via ORAL
  Administered 2019-03-13: 10 mg via ORAL
  Administered 2019-03-14 – 2019-03-15 (×6): 5 mg via ORAL
  Filled 2019-03-03: qty 2
  Filled 2019-03-03: qty 1
  Filled 2019-03-03 (×4): qty 2
  Filled 2019-03-03: qty 1
  Filled 2019-03-03: qty 2
  Filled 2019-03-03: qty 1
  Filled 2019-03-03 (×2): qty 2
  Filled 2019-03-03: qty 1
  Filled 2019-03-03: qty 2
  Filled 2019-03-03: qty 1
  Filled 2019-03-03 (×4): qty 2
  Filled 2019-03-03: qty 1
  Filled 2019-03-03 (×2): qty 2
  Filled 2019-03-03 (×3): qty 1
  Filled 2019-03-03: qty 2

## 2019-03-03 MED ORDER — ALBUMIN HUMAN 5 % IV SOLN: INTRAVENOUS | Status: DC | PRN

## 2019-03-03 MED ORDER — KETOROLAC TROMETHAMINE 15 MG/ML IJ SOLN
15.0000 mg | Freq: Four times a day (QID) | INTRAMUSCULAR | Status: AC
  Administered 2019-03-03 – 2019-03-05 (×6): 15 mg via INTRAVENOUS
  Filled 2019-03-03 (×7): qty 1

## 2019-03-03 MED ORDER — LIDOCAINE 2% (20 MG/ML) 5 ML SYRINGE
INTRAMUSCULAR | Status: DC | PRN
  Administered 2019-03-03: 80 mg via INTRAVENOUS

## 2019-03-03 MED ORDER — MIDAZOLAM HCL 5 MG/5ML IJ SOLN
INTRAMUSCULAR | Status: DC | PRN
  Administered 2019-03-03: 2 mg via INTRAVENOUS

## 2019-03-03 MED ORDER — IOHEXOL 300 MG/ML  SOLN
100.0000 mL | Freq: Once | INTRAMUSCULAR | Status: AC | PRN
  Administered 2019-03-03: 100 mL via INTRAVENOUS

## 2019-03-03 MED ORDER — DEXMEDETOMIDINE HCL IN NACL 200 MCG/50ML IV SOLN
INTRAVENOUS | Status: DC | PRN
  Administered 2019-03-03 (×3): 4 ug via INTRAVENOUS

## 2019-03-03 MED ORDER — ACETAMINOPHEN 325 MG PO TABS: 325.0000 mg | ORAL_TABLET | Freq: Four times a day (QID) | ORAL | Status: DC | PRN

## 2019-03-03 MED ORDER — HYDROMORPHONE HCL 1 MG/ML IJ SOLN
0.5000 mg | INTRAMUSCULAR | Status: DC | PRN
  Filled 2019-03-03: qty 1

## 2019-03-03 MED ORDER — SODIUM CHLORIDE 0.9 % IV BOLUS
1000.0000 mL | Freq: Once | INTRAVENOUS | Status: AC
  Administered 2019-03-03: 1000 mL via INTRAVENOUS

## 2019-03-03 MED ORDER — PROPOFOL 10 MG/ML IV BOLUS
INTRAVENOUS | Status: AC
  Filled 2019-03-03: qty 20

## 2019-03-03 MED ORDER — PANTOPRAZOLE SODIUM 40 MG PO TBEC
40.0000 mg | DELAYED_RELEASE_TABLET | Freq: Every day | ORAL | Status: DC
  Administered 2019-03-04 – 2019-03-15 (×12): 40 mg via ORAL
  Filled 2019-03-03 (×13): qty 1

## 2019-03-03 MED ORDER — METHOCARBAMOL 1000 MG/10ML IJ SOLN
500.0000 mg | Freq: Four times a day (QID) | INTRAVENOUS | Status: DC | PRN
  Filled 2019-03-03 (×2): qty 5

## 2019-03-03 MED ORDER — ONDANSETRON HCL 4 MG/2ML IJ SOLN: 4.0000 mg | Freq: Once | INTRAMUSCULAR | Status: DC | PRN

## 2019-03-03 MED ORDER — DOCUSATE SODIUM 100 MG PO CAPS
100.0000 mg | ORAL_CAPSULE | Freq: Two times a day (BID) | ORAL | Status: DC
  Administered 2019-03-03 – 2019-03-15 (×24): 100 mg via ORAL
  Filled 2019-03-03 (×24): qty 1

## 2019-03-03 MED ORDER — SODIUM CHLORIDE 0.9 % IV SOLN: INTRAVENOUS | Status: DC

## 2019-03-03 MED ORDER — FENTANYL CITRATE (PF) 100 MCG/2ML IJ SOLN
25.0000 ug | INTRAMUSCULAR | Status: DC | PRN
  Administered 2019-03-03: 16:00:00 25 ug via INTRAVENOUS

## 2019-03-03 MED ORDER — OXYCODONE HCL 5 MG PO TABS
10.0000 mg | ORAL_TABLET | ORAL | Status: DC | PRN
  Administered 2019-03-04 – 2019-03-11 (×7): 10 mg via ORAL
  Filled 2019-03-03: qty 2
  Filled 2019-03-03: qty 3
  Filled 2019-03-03 (×2): qty 2

## 2019-03-03 MED ORDER — FENTANYL CITRATE (PF) 250 MCG/5ML IJ SOLN
INTRAMUSCULAR | Status: DC | PRN
  Administered 2019-03-03: 100 ug via INTRAVENOUS
  Administered 2019-03-03 (×3): 50 ug via INTRAVENOUS

## 2019-03-03 MED ORDER — OXYCODONE HCL 5 MG PO TABS: 10.0000 mg | ORAL_TABLET | ORAL | Status: DC | PRN

## 2019-03-03 MED ORDER — FENTANYL CITRATE (PF) 250 MCG/5ML IJ SOLN
INTRAMUSCULAR | Status: AC
  Filled 2019-03-03: qty 5

## 2019-03-03 MED ORDER — CHLORHEXIDINE GLUCONATE CLOTH 2 % EX PADS: 6.0000 | MEDICATED_PAD | Freq: Every day | CUTANEOUS | Status: DC

## 2019-03-03 MED ORDER — POLYETHYLENE GLYCOL 3350 17 G PO PACK
17.0000 g | PACK | Freq: Every day | ORAL | Status: DC
  Administered 2019-03-04 – 2019-03-15 (×12): 17 g via ORAL
  Filled 2019-03-03 (×12): qty 1

## 2019-03-03 MED ORDER — METOCLOPRAMIDE HCL 5 MG PO TABS: 5.0000 mg | ORAL_TABLET | Freq: Three times a day (TID) | ORAL | Status: DC | PRN

## 2019-03-03 MED ORDER — OXYCODONE HCL 5 MG PO TABS
5.0000 mg | ORAL_TABLET | Freq: Once | ORAL | Status: AC | PRN
  Administered 2019-03-03: 17:00:00 5 mg via ORAL

## 2019-03-03 MED ORDER — DOCUSATE SODIUM 100 MG PO CAPS: 100.0000 mg | ORAL_CAPSULE | Freq: Two times a day (BID) | ORAL | Status: DC

## 2019-03-03 MED ORDER — OXYCODONE HCL 5 MG PO TABS
ORAL_TABLET | ORAL | Status: AC
  Filled 2019-03-03: qty 1

## 2019-03-03 MED ORDER — DEXAMETHASONE SODIUM PHOSPHATE 10 MG/ML IJ SOLN
INTRAMUSCULAR | Status: AC
  Filled 2019-03-03: qty 1

## 2019-03-03 MED ORDER — MUPIROCIN 2 % EX OINT: 1.0000 "application " | TOPICAL_OINTMENT | Freq: Two times a day (BID) | CUTANEOUS | Status: DC

## 2019-03-03 MED ORDER — ROCURONIUM BROMIDE 50 MG/5ML IV SOSY
PREFILLED_SYRINGE | INTRAVENOUS | Status: DC | PRN
  Administered 2019-03-03 (×2): 20 mg via INTRAVENOUS
  Administered 2019-03-03: 60 mg via INTRAVENOUS
  Administered 2019-03-03: 20 mg via INTRAVENOUS

## 2019-03-03 MED ORDER — ONDANSETRON HCL 4 MG/2ML IJ SOLN
INTRAMUSCULAR | Status: AC
  Filled 2019-03-03: qty 2

## 2019-03-03 MED ORDER — METHOCARBAMOL 750 MG PO TABS
750.0000 mg | ORAL_TABLET | Freq: Three times a day (TID) | ORAL | Status: DC
  Administered 2019-03-04 – 2019-03-15 (×37): 750 mg via ORAL
  Filled 2019-03-03 (×37): qty 1

## 2019-03-03 MED ORDER — ACETAMINOPHEN 325 MG PO TABS
325.0000 mg | ORAL_TABLET | Freq: Four times a day (QID) | ORAL | Status: DC | PRN
  Administered 2019-03-05: 05:00:00 650 mg via ORAL
  Filled 2019-03-03: qty 2

## 2019-03-03 MED ORDER — METHOCARBAMOL 1000 MG/10ML IJ SOLN
500.0000 mg | Freq: Three times a day (TID) | INTRAVENOUS | Status: DC
  Administered 2019-03-03: 19:00:00 500 mg via INTRAVENOUS
  Filled 2019-03-03: qty 5

## 2019-03-03 SURGICAL SUPPLY — 96 items
BIT DRILL 4.3 (BIT) ×2
BIT DRILL 4.3X300MM (BIT) ×1 IMPLANT
BIT DRILL CANN 3.5MM (DRILL) ×1 IMPLANT
BIT DRILL CANNULTD 2.6 X 130MM (DRILL) ×1 IMPLANT
BIT DRILL LONG 3.3 (BIT) ×2 IMPLANT
BIT DRILL QC 3.3X195 (BIT) ×2 IMPLANT
BLADE CLIPPER SURG (BLADE) IMPLANT
BNDG ELASTIC 4X5.8 VLCR STR LF (GAUZE/BANDAGES/DRESSINGS) ×2 IMPLANT
BNDG ELASTIC 6X15 VLCR STRL LF (GAUZE/BANDAGES/DRESSINGS) ×2 IMPLANT
BNDG ELASTIC 6X5.8 VLCR STR LF (GAUZE/BANDAGES/DRESSINGS) ×2 IMPLANT
BNDG GAUZE ELAST 4 BULKY (GAUZE/BANDAGES/DRESSINGS) ×2 IMPLANT
BRUSH SCRUB EZ PLAIN DRY (MISCELLANEOUS) ×4 IMPLANT
CANISTER SUCT 3000ML PPV (MISCELLANEOUS) ×2 IMPLANT
CAP LOCK NCB (Cap) ×12 IMPLANT
COVER SURGICAL LIGHT HANDLE (MISCELLANEOUS) ×2 IMPLANT
COVER WAND RF STERILE (DRAPES) ×2 IMPLANT
DRAPE C-ARM 42X72 X-RAY (DRAPES) ×2 IMPLANT
DRAPE C-ARMOR (DRAPES) ×2 IMPLANT
DRAPE IMP U-DRAPE 54X76 (DRAPES) ×2 IMPLANT
DRAPE ORTHO SPLIT 77X108 STRL (DRAPES) ×3
DRAPE SURG ORHT 6 SPLT 77X108 (DRAPES) ×3 IMPLANT
DRAPE U-SHAPE 47X51 STRL (DRAPES) ×2 IMPLANT
DRILL CANN 3.5MM (DRILL) ×2
DRILL CANNULATED 2.6 X 130MM (DRILL) ×2
DRSG ADAPTIC 3X8 NADH LF (GAUZE/BANDAGES/DRESSINGS) ×2 IMPLANT
DRSG MEPILEX BORDER 4X12 (GAUZE/BANDAGES/DRESSINGS) ×2 IMPLANT
DRSG MEPILEX BORDER 4X4 (GAUZE/BANDAGES/DRESSINGS) ×2 IMPLANT
DRSG MEPILEX BORDER 4X8 (GAUZE/BANDAGES/DRESSINGS) ×2 IMPLANT
DRSG PAD ABDOMINAL 8X10 ST (GAUZE/BANDAGES/DRESSINGS) ×8 IMPLANT
ELECT REM PT RETURN 9FT ADLT (ELECTROSURGICAL) ×2
ELECTRODE REM PT RTRN 9FT ADLT (ELECTROSURGICAL) ×1 IMPLANT
EVACUATOR 1/8 PVC DRAIN (DRAIN) IMPLANT
EVACUATOR 3/16  PVC DRAIN (DRAIN)
EVACUATOR 3/16 PVC DRAIN (DRAIN) IMPLANT
GAUZE SPONGE 4X4 12PLY STRL (GAUZE/BANDAGES/DRESSINGS) ×2 IMPLANT
GAUZE SPONGE 4X4 12PLY STRL LF (GAUZE/BANDAGES/DRESSINGS) ×2 IMPLANT
GLOVE BIO SURGEON STRL SZ7 (GLOVE) ×2 IMPLANT
GLOVE BIO SURGEON STRL SZ7.5 (GLOVE) ×2 IMPLANT
GLOVE BIO SURGEON STRL SZ8 (GLOVE) ×6 IMPLANT
GLOVE BIOGEL PI IND STRL 6.5 (GLOVE) ×1 IMPLANT
GLOVE BIOGEL PI IND STRL 7.5 (GLOVE) ×1 IMPLANT
GLOVE BIOGEL PI IND STRL 8 (GLOVE) ×2 IMPLANT
GLOVE BIOGEL PI INDICATOR 6.5 (GLOVE) ×1
GLOVE BIOGEL PI INDICATOR 7.5 (GLOVE) ×1
GLOVE BIOGEL PI INDICATOR 8 (GLOVE) ×2
GLOVE SURG SS PI 7.0 STRL IVOR (GLOVE) ×2 IMPLANT
GOWN STRL REUS W/ TWL LRG LVL3 (GOWN DISPOSABLE) ×2 IMPLANT
GOWN STRL REUS W/ TWL XL LVL3 (GOWN DISPOSABLE) ×1 IMPLANT
GOWN STRL REUS W/TWL LRG LVL3 (GOWN DISPOSABLE) ×2
GOWN STRL REUS W/TWL XL LVL3 (GOWN DISPOSABLE) ×1
K-WIRE 1.8 ×2 IMPLANT
K-WIRE 2.0 (WIRE) ×1
K-WIRE FXSTD 280X2XNS SS (WIRE) ×1
K-WIRE PLA 9 .062 (WIRE) ×6 IMPLANT
K-WIRE SNGL END 1.2X150 (MISCELLANEOUS) ×4
KIT BASIN OR (CUSTOM PROCEDURE TRAY) ×2 IMPLANT
KIT TURNOVER KIT B (KITS) ×2 IMPLANT
KWIRE FXSTD 280X2XNS SS (WIRE) ×1 IMPLANT
KWIRE SNGL END 1.2X150 (MISCELLANEOUS) ×2 IMPLANT
NEEDLE 22X1 1/2 (OR ONLY) (NEEDLE) IMPLANT
NS IRRIG 1000ML POUR BTL (IV SOLUTION) ×2 IMPLANT
PACK TOTAL JOINT (CUSTOM PROCEDURE TRAY) ×2 IMPLANT
PACK UNIVERSAL I (CUSTOM PROCEDURE TRAY) ×2 IMPLANT
PAD ARMBOARD 7.5X6 YLW CONV (MISCELLANEOUS) ×4 IMPLANT
PAD CAST 4YDX4 CTTN HI CHSV (CAST SUPPLIES) ×1 IMPLANT
PADDING CAST COTTON 4X4 STRL (CAST SUPPLIES) ×1
PADDING CAST COTTON 6X4 STRL (CAST SUPPLIES) ×2 IMPLANT
PLATE DIST FEM 12H (Plate) ×2 IMPLANT
SCREW 5.0 32MM (Screw) ×2 IMPLANT
SCREW 5.0 80MM (Screw) ×6 IMPLANT
SCREW CANN HDLS ST 4.0X46 (Screw) ×2 IMPLANT
SCREW CANN THD 5.0 X 75 (Screw) ×2 IMPLANT
SCREW CORT NCB SELFTAP 5.0X50 (Screw) ×2 IMPLANT
SCREW CORTICAL NCB 5.0X40 (Screw) ×2 IMPLANT
SCREW CORTICAL NCB 5.0X65 (Screw) ×4 IMPLANT
SCREW NCB 4.0X36MM (Screw) ×2 IMPLANT
SCREW NCB 5.0X36MM (Screw) ×2 IMPLANT
SCREW NCB 5.0X38 (Screw) ×4 IMPLANT
SPONGE LAP 18X18 RF (DISPOSABLE) ×2 IMPLANT
STAPLER VISISTAT 35W (STAPLE) ×2 IMPLANT
SUCTION FRAZIER HANDLE 10FR (MISCELLANEOUS) ×1
SUCTION TUBE FRAZIER 10FR DISP (MISCELLANEOUS) ×1 IMPLANT
SUT ETHILON 2 0 PSLX (SUTURE) ×2 IMPLANT
SUT PROLENE 0 CT 2 (SUTURE) IMPLANT
SUT VIC AB 0 CT1 27 (SUTURE) ×1
SUT VIC AB 0 CT1 27XBRD ANBCTR (SUTURE) ×1 IMPLANT
SUT VIC AB 1 CT1 27 (SUTURE) ×2
SUT VIC AB 1 CT1 27XBRD ANBCTR (SUTURE) ×2 IMPLANT
SUT VIC AB 2-0 CT1 27 (SUTURE) ×1
SUT VIC AB 2-0 CT1 TAPERPNT 27 (SUTURE) ×1 IMPLANT
SYR 20ML ECCENTRIC (SYRINGE) IMPLANT
TOWEL GREEN STERILE (TOWEL DISPOSABLE) ×4 IMPLANT
TOWEL GREEN STERILE FF (TOWEL DISPOSABLE) ×2 IMPLANT
TRAY FOLEY MTR SLVR 16FR STAT (SET/KITS/TRAYS/PACK) ×2 IMPLANT
TRAY URETHRAL FOLEY CATH 14FR (CATHETERS) ×2 IMPLANT
WATER STERILE IRR 1000ML POUR (IV SOLUTION) ×4 IMPLANT

## 2019-03-03 NOTE — ED Notes (Signed)
Patient transported to CT 
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
## 2019-03-03 NOTE — Progress Notes (Signed)
Report called to Short Stay to Lauren Garrison.  The patient is ready for surgery.

## 2019-03-03 NOTE — Progress Notes (Signed)
I have agreed to assume management of the patient from Dr. Lennette Bihari Haddix. I have seen and examined the patient. I agree with his detailed assessment and plan.  I discussed with the patient the risks and benefits of surgery for ORIF of her left distal femur with closed treatment of the lateral tibial plateau, including the possibility of infection, nerve injury, vessel injury, wound breakdown, arthritis, symptomatic hardware, DVT/ PE, loss of motion, malunion, nonunion, and need for further surgery among others.  We also specifically her underlying arthritis and the increased likelihood of needing TKA in the future, possibly with a prior surgery for hardware removal. She acknowledged these risks and wished to proceed.   Lauren Box, MD 03/03/2019 11:26 AM

## 2019-03-03 NOTE — Transfer of Care (Signed)
Immediate Anesthesia Transfer of Care Note  Patient: Lauren Garrison  Procedure(s) Performed: OPEN REDUCTION INTERNAL FIXATION (ORIF) DISTAL FEMUR FRACTURE (Left )  Patient Location: PACU  Anesthesia Type:General  Level of Consciousness: awake, alert  and oriented  Airway & Oxygen Therapy: Patient Spontanous Breathing and Patient connected to face mask oxygen  Post-op Assessment: Report given to RN and Post -op Vital signs reviewed and stable  Post vital signs: Reviewed and stable  Last Vitals:  Vitals Value Taken Time  BP 139/81 03/03/19 1540  Temp    Pulse 100 03/03/19 1550  Resp 19 03/03/19 1550  SpO2 100 % 03/03/19 1550  Vitals shown include unvalidated device data.  Last Pain:  Vitals:   03/03/19 0730  TempSrc:   PainSc: Asleep         Complications: No apparent anesthesia complications

## 2019-03-03 NOTE — ED Notes (Signed)
ED Provider at bedside. 

## 2019-03-03 NOTE — Progress Notes (Signed)
Arrived to 6n23 from ED at this time
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
## 2019-03-03 NOTE — Anesthesia Procedure Notes (Signed)
Procedure Name: Intubation Date/Time: 03/03/2019 12:08 PM Performed by: Griffin Dakin, CRNA Pre-anesthesia Checklist: Patient identified, Emergency Drugs available, Suction available and Patient being monitored Patient Re-evaluated:Patient Re-evaluated prior to induction Oxygen Delivery Method: Circle system utilized Preoxygenation: Pre-oxygenation with 100% oxygen Induction Type: IV induction Ventilation: Mask ventilation without difficulty and Oral airway inserted - appropriate to patient size Laryngoscope Size: Mac and 3 Tube type: Oral Tube size: 7.0 mm Number of attempts: 1 Airway Equipment and Method: Stylet and Oral airway Placement Confirmation: ETT inserted through vocal cords under direct vision,  positive ETCO2 and breath sounds checked- equal and bilateral Secured at: 22 cm Tube secured with: Tape Dental Injury: Teeth and Oropharynx as per pre-operative assessment

## 2019-03-03 NOTE — Anesthesia Preprocedure Evaluation (Signed)
Anesthesia Evaluation  Patient identified by MRN, date of birth, ID band Patient awake    Reviewed: Allergy & Precautions, NPO status , Patient's Chart, lab work & pertinent test results  Airway Mallampati: II  TM Distance: >3 FB Neck ROM: Full    Dental  (+) Teeth Intact, Dental Advisory Given   Pulmonary    breath sounds clear to auscultation       Cardiovascular  Rhythm:Regular Rate:Normal     Neuro/Psych    GI/Hepatic   Endo/Other    Renal/GU      Musculoskeletal   Abdominal (+) + obese,   Peds  Hematology   Anesthesia Other Findings   Reproductive/Obstetrics                             Anesthesia Physical Anesthesia Plan  ASA: III  Anesthesia Plan: General   Post-op Pain Management:    Induction: Intravenous  PONV Risk Score and Plan:   Airway Management Planned: Oral ETT  Additional Equipment:   Intra-op Plan:   Post-operative Plan: Extubation in OR  Informed Consent: I have reviewed the patients History and Physical, chart, labs and discussed the procedure including the risks, benefits and alternatives for the proposed anesthesia with the patient or authorized representative who has indicated his/her understanding and acceptance.     Dental advisory given  Plan Discussed with: CRNA and Anesthesiologist  Anesthesia Plan Comments:         Anesthesia Quick Evaluation
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
## 2019-03-03 NOTE — Progress Notes (Signed)
Orthopedic Tech Progress Note Patient Details:  Lauren Garrison 12/16/1960 [id number] I applied an Over Head Frame with Trapeze for patient Patient ID: [name], female   DOB: 1960-12-06, 58 y.o.   MRN: [id number]   [name] 03/03/2019, 6:36 PM

## 2019-03-04 ENCOUNTER — Encounter (HOSPITAL_COMMUNITY): Payer: Self-pay | Admitting: Student

## 2019-03-04 DIAGNOSIS — G43909 Migraine, unspecified, not intractable, without status migrainosus: Secondary | ICD-10-CM | POA: Diagnosis present

## 2019-03-04 DIAGNOSIS — S72402A Unspecified fracture of lower end of left femur, initial encounter for closed fracture: Secondary | ICD-10-CM

## 2019-03-04 DIAGNOSIS — E669 Obesity, unspecified: Secondary | ICD-10-CM

## 2019-03-04 DIAGNOSIS — M1712 Unilateral primary osteoarthritis, left knee: Secondary | ICD-10-CM

## 2019-03-04 HISTORY — DX: Unspecified fracture of lower end of left femur, initial encounter for closed fracture: S72.402A

## 2019-03-04 HISTORY — DX: Unilateral primary osteoarthritis, left knee: M17.12

## 2019-03-04 HISTORY — DX: Obesity, unspecified: E66.9

## 2019-03-04 LAB — COMPREHENSIVE METABOLIC PANEL
ALT: 39 U/L (ref 0–44)
AST: 54 U/L — ABNORMAL HIGH (ref 15–41)
Albumin: 2.8 g/dL — ABNORMAL LOW (ref 3.5–5.0)
Alkaline Phosphatase: 57 U/L (ref 38–126)
Anion gap: 10 (ref 5–15)
BUN: 14 mg/dL (ref 6–20)
CO2: 26 mmol/L (ref 22–32)
Calcium: 7.9 mg/dL — ABNORMAL LOW (ref 8.9–10.3)
Chloride: 102 mmol/L (ref 98–111)
Creatinine, Ser: 0.89 mg/dL (ref 0.44–1.00)
GFR calc Af Amer: >60 mL/min (ref >60)
GFR calc non Af Amer: >60 mL/min (ref >60)
Glucose, Bld: 147 mg/dL — ABNORMAL HIGH (ref 70–99)
Potassium: 4 mmol/L (ref 3.5–5.1)
Sodium: 138 mmol/L (ref 135–145)
Total Bilirubin: 0.3 mg/dL (ref 0.3–1.2)
Total Protein: 5.5 g/dL — ABNORMAL LOW (ref 6.5–8.1)

## 2019-03-04 LAB — CBC
HCT: 29.6 % — ABNORMAL LOW (ref 36.0–46.0)
Hemoglobin: 9.3 g/dL — ABNORMAL LOW (ref 12.0–15.0)
MCH: 29.1 pg (ref 26.0–34.0)
MCHC: 31.4 g/dL (ref 30.0–36.0)
MCV: 92.5 fL (ref 80.0–100.0)
Platelets: 138 10*3/uL — ABNORMAL LOW (ref 150–400)
RBC: 3.2 MIL/uL — ABNORMAL LOW (ref 3.87–5.11)
RDW: 14.8 % (ref 11.5–15.5)
WBC: 8.6 10*3/uL (ref 4.0–10.5)
nRBC: 0 % (ref 0.0–0.2)

## 2019-03-04 LAB — VITAMIN D 25 HYDROXY (VIT D DEFICIENCY, FRACTURES): Vit D, 25-Hydroxy: 6.88 ng/mL — ABNORMAL LOW (ref 30–100)

## 2019-03-04 NOTE — Social Work (Signed)
CSW acknowledging consult for HH/DME. Will follow for therapy recommendations needed to best determine disposition/for insurance authorization.  If SNF recommended placement with be very difficult. Pt with managed insurance, would need to assess for benefits and placement options would be very limited.   Lauren Garrison, MSW, [place] [employer]
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
## 2019-03-04 NOTE — Anesthesia Postprocedure Evaluation (Signed)
Anesthesia Post Note  Patient: Mandilyn Villafana  Procedure(s) Performed: OPEN REDUCTION INTERNAL FIXATION (ORIF) DISTAL FEMUR FRACTURE (Left )     Patient location during evaluation: PACU Anesthesia Type: General Level of consciousness: awake and alert Pain management: pain level controlled Vital Signs Assessment: post-procedure vital signs reviewed and stable Respiratory status: spontaneous breathing, nonlabored ventilation, respiratory function stable and patient connected to nasal cannula oxygen Cardiovascular status: blood pressure returned to baseline and stable Postop Assessment: no apparent nausea or vomiting Anesthetic complications: no    Last Vitals:  Vitals:   03/04/19 0135 03/04/19 0423  BP: 133/72 128/74  Pulse: (!) 107 98  Resp: 17 20  Temp: 37.2 C 37.2 C  SpO2: 98% 99%    Last Pain:  Vitals:   03/04/19 0423  TempSrc: Oral  PainSc:                  Manju Kulkarni COKER
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
## 2019-03-05 MED ORDER — VITAMIN D 25 MCG (1000 UNIT) PO TABS
1000.0000 [IU] | ORAL_TABLET | Freq: Two times a day (BID) | ORAL | Status: DC
  Administered 2019-03-05 – 2019-03-06 (×4): 1000 [IU] via ORAL
  Filled 2019-03-05 (×4): qty 1
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
## 2019-03-05 NOTE — Plan of Care (Signed)
  Problem: Activity: Goal: Risk for activity intolerance will decrease Outcome: Progressing   Problem: Pain Managment: Goal: General experience of comfort will improve Outcome: Progressing   
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
## 2019-03-07 ENCOUNTER — Encounter: Payer: Self-pay | Admitting: *Deleted

## 2019-03-07 DIAGNOSIS — E559 Vitamin D deficiency, unspecified: Secondary | ICD-10-CM | POA: Diagnosis present

## 2019-03-07 LAB — CBC
HCT: 27.2 % — ABNORMAL LOW (ref 36.0–46.0)
Hemoglobin: 8.5 g/dL — ABNORMAL LOW (ref 12.0–15.0)
MCH: 29.5 pg (ref 26.0–34.0)
MCHC: 31.3 g/dL (ref 30.0–36.0)
MCV: 94.4 fL (ref 80.0–100.0)
Platelets: 196 10*3/uL (ref 150–400)
RBC: 2.88 MIL/uL — ABNORMAL LOW (ref 3.87–5.11)
RDW: 14.6 % (ref 11.5–15.5)
WBC: 5.9 10*3/uL (ref 4.0–10.5)
nRBC: 1.5 % — ABNORMAL HIGH (ref 0.0–0.2)

## 2019-03-07 MED ORDER — MAGNESIUM CITRATE PO SOLN: 0.5000 | Freq: Once | ORAL | Status: DC | PRN

## 2019-03-07 MED ORDER — VITAMIN D 25 MCG (1000 UNIT) PO TABS
2000.0000 [IU] | ORAL_TABLET | Freq: Two times a day (BID) | ORAL | Status: DC
  Administered 2019-03-07 – 2019-03-15 (×18): 2000 [IU] via ORAL
  Filled 2019-03-07 (×19): qty 2

## 2019-03-07 MED ORDER — ALPRAZOLAM 0.25 MG PO TABS
0.2500 mg | ORAL_TABLET | Freq: Two times a day (BID) | ORAL | Status: DC | PRN
  Administered 2019-03-08: 0.25 mg via ORAL
  Filled 2019-03-07: qty 1

## 2019-03-07 MED ORDER — VITAMIN D (ERGOCALCIFEROL) 1.25 MG (50000 UNIT) PO CAPS
50000.0000 [IU] | ORAL_CAPSULE | ORAL | Status: DC
  Administered 2019-03-07 – 2019-03-14 (×2): 50000 [IU] via ORAL
  Filled 2019-03-07 (×3): qty 1

## 2019-03-07 MED ORDER — ACETAMINOPHEN 500 MG PO TABS
1000.0000 mg | ORAL_TABLET | Freq: Three times a day (TID) | ORAL | Status: DC
  Administered 2019-03-07 – 2019-03-15 (×24): 1000 mg via ORAL
  Filled 2019-03-07 (×24): qty 2

## 2019-03-07 MED ORDER — LIP MEDEX EX OINT: 1.0000 "application " | TOPICAL_OINTMENT | CUTANEOUS | Status: DC | PRN

## 2019-03-07 MED ORDER — ALUM & MAG HYDROXIDE-SIMETH 200-200-20 MG/5ML PO SUSP: 30.0000 mL | ORAL | Status: DC | PRN

## 2019-03-07 MED ORDER — GUAIFENESIN-DM 100-10 MG/5ML PO SYRP: 5.0000 mL | ORAL_SOLUTION | ORAL | Status: DC | PRN

## 2019-03-07 MED ORDER — LORATADINE 10 MG PO TABS: 10.0000 mg | ORAL_TABLET | Freq: Every day | ORAL | Status: DC | PRN

## 2019-03-07 MED ORDER — PHENOL 1.4 % MT LIQD: 1.0000 | OROMUCOSAL | Status: DC | PRN

## 2019-03-07 MED ORDER — ALPRAZOLAM 0.25 MG PO TABS
0.2500 mg | ORAL_TABLET | Freq: Once | ORAL | Status: AC
  Administered 2019-03-07: 10:00:00 0.25 mg via ORAL
  Filled 2019-03-07: qty 1

## 2019-03-07 NOTE — Progress Notes (Signed)
OT note  New insurance information:  Member number [id number] [facility]  Member services [phone]   Fleeta Emmer, OTR/L  Acute Rehabilitation Services Pager: [phone] Office: [phone] .
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
## 2019-03-07 NOTE — Plan of Care (Signed)
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
## 2019-03-08 ENCOUNTER — Encounter: Payer: Self-pay | Admitting: *Deleted

## 2019-03-08 NOTE — Plan of Care (Signed)
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
## 2019-03-09 ENCOUNTER — Inpatient Hospital Stay (HOSPITAL_COMMUNITY): Payer: 59

## 2019-03-09 LAB — SARS CORONAVIRUS 2 (TAT 6-24 HRS): SARS Coronavirus 2: NEGATIVE

## 2019-03-09 MED ORDER — RIVAROXABAN 10 MG PO TABS
10.0000 mg | ORAL_TABLET | Freq: Every day | ORAL | Status: DC
  Administered 2019-03-10 – 2019-03-15 (×6): 10 mg via ORAL
  Filled 2019-03-09 (×7): qty 1

## 2019-03-09 MED ORDER — WHITE PETROLATUM EX OINT
TOPICAL_OINTMENT | CUTANEOUS | Status: AC
  Filled 2019-03-09: qty 28.35

## 2019-03-09 NOTE — TOC Progression Note (Addendum)
Transition of Care [facility]) - Progression Note    Patient Details  Name: Lauren Garrison MRN: [id number] Date of Birth: 1960/09/12  Transition of Care [facility]) CM/SW [place], [place] Phone Number: 03/09/2019, 3:28 PM  Clinical Narrative:   4:58pm- CSW spoke with [place] at [phone], per Becky Sax pt still has an active plan with [employer], including SNF benefit. CSW spoke with pt via room telephone and pt states she does not have [employer], just the [place]. She will be in touch with her HR department. Plan for CSW and pt to call [employer] rep tomorrow together.    3:28pm- CSW called again and spoke with pt [facility] plan [phone]). Confirmed pt again doesn't have SNF or HH benefits, representative confirmed this. CSW inquired about single authorization for SNF- plan representative states that this is not a benefit or possibility with pt plan; "this is not major medical insurance." CSW also discussed if pt has CIR benefit per representative pt does not have any in hospital rehab benefit either.  At this time CSW leadership has requested CSW seek LOG placement- bed availability for this is extremely limited. Will start process, continue to follow.    Expected Discharge Plan: Home/Self Care Barriers to Discharge: Continued Medical Work up, Inadequate or no insurance  Expected Discharge Plan and Services Expected Discharge Plan: Home/Self Care In-house Referral: Clinical Social Work Discharge Planning Services: CM Consult Post Acute Care Choice: [place], [place] Living arrangements for the past 2 months: Single Family Home  Readmission Risk Interventions Readmission Risk Prevention Plan 03/08/2019  Post Dischage Appt Not Complete  Appt Comments disposition pending  Medication Screening Complete  Transportation Screening Complete
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
## 2019-03-09 NOTE — Plan of Care (Signed)
  Problem: Pain Managment: Goal: General experience of comfort will improve Outcome: Progressing   Problem: Safety: Goal: Ability to remain free from injury will improve Outcome: Progressing   Problem: Skin Integrity: Goal: Risk for impaired skin integrity will decrease Outcome: Progressing   
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
## 2019-03-10 LAB — CREATININE, SERUM
Creatinine, Ser: 0.83 mg/dL (ref 0.44–1.00)
GFR calc Af Amer: >60 mL/min (ref >60)
GFR calc non Af Amer: >60 mL/min (ref >60)

## 2019-03-10 LAB — CBC
HCT: 26.9 % — ABNORMAL LOW (ref 36.0–46.0)
Hemoglobin: 8.4 g/dL — ABNORMAL LOW (ref 12.0–15.0)
MCH: 29.7 pg (ref 26.0–34.0)
MCHC: 31.2 g/dL (ref 30.0–36.0)
MCV: 95.1 fL (ref 80.0–100.0)
Platelets: 240 10*3/uL (ref 150–400)
RBC: 2.83 MIL/uL — ABNORMAL LOW (ref 3.87–5.11)
RDW: 15.3 % (ref 11.5–15.5)
WBC: 6.3 10*3/uL (ref 4.0–10.5)
nRBC: 1.7 % — ABNORMAL HIGH (ref 0.0–0.2)
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
## 2019-03-10 NOTE — TOC Progression Note (Signed)
Transition of Care [facility]) - Progression Note    Patient Details  Name: Lauren Garrison MRN: [id number] Date of Birth: 06/20/1960  Transition of Care [facility]) CM/SW [place], [place] Phone Number: 03/10/2019, 3:36 PM  Clinical Narrative:    CSW spoke with pt, we discussed that pt [employer] benefits were showing as valid as of 02/18/19, per Myrlene Broker. CSW called and left message with pt UR RN at [facility] to see if benefits could be faxed to [place] to review.   Pt agreeable to utilizing her [employer] benefits and understands that options may be limited with her insurance but we will seek placement authorization once we have options. Pt in agreement and understanding.   Per [facility] who has offered pt has 60 days SNF benefit.    Expected Discharge Plan: Home/Self Care Barriers to Discharge: Continued Medical Work up, Inadequate or no insurance  Expected Discharge Plan and Services Expected Discharge Plan: Home/Self Care In-house Referral: Clinical Social Work Discharge Planning Services: CM Consult Post Acute Care Choice: [place], [place] Living arrangements for the past 2 months: Single Family Home  Readmission Risk Interventions Readmission Risk Prevention Plan 03/08/2019  Post Dischage Appt Not Complete  Appt Comments disposition pending  Medication Screening Complete  Transportation Screening Complete
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
## 2019-03-11 MED ORDER — OXYCODONE-ACETAMINOPHEN 5-325 MG PO TABS: 1.0000 | ORAL_TABLET | Freq: Four times a day (QID) | ORAL | 0 refills | Status: DC | PRN

## 2019-03-11 MED ORDER — METHOCARBAMOL 750 MG PO TABS: 750.0000 mg | ORAL_TABLET | Freq: Three times a day (TID) | ORAL | 0 refills | Status: DC

## 2019-03-11 MED ORDER — ACETAMINOPHEN 500 MG PO TABS: 500.0000 mg | ORAL_TABLET | Freq: Two times a day (BID) | ORAL | 0 refills | Status: DC

## 2019-03-11 MED ORDER — RIVAROXABAN 10 MG PO TABS: 10.0000 mg | ORAL_TABLET | Freq: Every day | ORAL | 0 refills | Status: DC

## 2019-03-11 MED ORDER — VITAMIN D (ERGOCALCIFEROL) 1.25 MG (50000 UNIT) PO CAPS: 50000.0000 [IU] | ORAL_CAPSULE | ORAL | 0 refills | Status: DC

## 2019-03-11 MED ORDER — ASCORBIC ACID 500 MG PO TABS: 500.0000 mg | ORAL_TABLET | Freq: Every day | ORAL | 0 refills | Status: DC

## 2019-03-11 MED ORDER — POLYETHYLENE GLYCOL 3350 17 G PO PACK: 17.0000 g | PACK | Freq: Every day | ORAL | 0 refills | Status: DC

## 2019-03-11 MED ORDER — VITAMIN D 125 MCG (5000 UT) PO CAPS: 1.0000 | ORAL_CAPSULE | Freq: Every day | ORAL | 6 refills | Status: DC

## 2019-03-11 MED ORDER — DOCUSATE SODIUM 100 MG PO CAPS: 100.0000 mg | ORAL_CAPSULE | Freq: Two times a day (BID) | ORAL | 0 refills | Status: DC

## 2019-03-11 NOTE — Progress Notes (Signed)
Inpatient Rehabilitation-Admissions Coordinator   Consult order received. Was notified by CSW that pt's sister [name] had some questions regarding CIR placement. I have left her a voicemail for her to call me so we can discuss. Of note, pt cannot be accepted to CIR with plan to DC to SNF. If pt does not have assistance at DC she will need SNF placement.  Await call back from [place].   Raechel Ache, OTR/L  Rehab Admissions Coordinator  [phone] 03/11/2019 1:28 PM

## 2019-03-11 NOTE — Progress Notes (Signed)
Inpatient Rehabilitation-Admissions Coordinator   Spoke with pt's sister [name] via phone. Discussed that CIR cannot accept a patient with [employer] with the plan to go SNF afterwards. Pt's sister also had concerns regarding barriers to finding more SNFs for her sister to choose from; recommended she speak with [facility] team regarding this issue as that is out of my scope.   At the end of the conversation, pt's sister understands CIR is not an option due to lack of assistance at DC and an inability to go from [facility] to SNF for further rehab.   AC has notified CSW of conversation. AC will sign off  Raechel Ache, OTR/L  Rehab Admissions Coordinator  [phone] 03/11/2019 3:16 PM
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
## 2019-03-11 NOTE — Plan of Care (Signed)
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
## 2019-03-11 NOTE — Discharge Instructions (Signed)
Information on my medicine - XARELTO (Rivaroxaban)  This medication education was reviewed with me or my healthcare representative as part of my discharge preparation.  Why was Xarelto prescribed for you? Xarelto was prescribed for you to reduce the risk of blood clots forming after orthopedic surgery. The medical term for these abnormal blood clots is venous thromboembolism (VTE).  What do you need to know about xarelto ? Take your Xarelto ONCE DAILY at the same time every day. You may take it either with or without food.  If you have difficulty swallowing the tablet whole, you may crush it and mix in applesauce just prior to taking your dose.  Take Xarelto exactly as prescribed by your doctor and DO NOT stop taking Xarelto without talking to the doctor who prescribed the medication.  Stopping without other VTE prevention medication to take the place of Xarelto may increase your risk of developing a clot.  After discharge, you should have regular check-up appointments with your healthcare provider that is prescribing your Xarelto.    What do you do if you miss a dose? If you miss a dose, take it as soon as you remember on the same day then continue your regularly scheduled once daily regimen the next day. Do not take two doses of Xarelto on the same day.   Important Safety Information A possible side effect of Xarelto is bleeding. You should call your healthcare provider right away if you experience any of the following: ? Bleeding from an injury or your nose that does not stop. ? Unusual colored urine (red or dark brown) or unusual colored stools (red or black). ? Unusual bruising for unknown reasons. ? A serious fall or if you hit your head (even if there is no bleeding).  Some medicines may interact with Xarelto and might increase your risk of bleeding while on Xarelto. To help avoid this, consult your healthcare provider or pharmacist prior to using any new prescription  or non-prescription medications, including herbals, vitamins, non-steroidal anti-inflammatory drugs (NSAIDs) and supplements.  This website has more information on Xarelto: [email or web address]     Orthopa[facility]uma Service Discharge Instructions   General Discharge Instructions  Orthopaedic Injuries:  Left distal femur fracture treated with open reduction and internal fixation using plates and screws   WEIGHT BEARING STATUS: Nonweightbearing Left Leg   RANGE OF MOTION/ACTIVITY: unrestricted range of motion left knee and hip.  Activity as tolerated while maintaining weightbearing restrictions.  Do not place pillows under the back of the knee at rest.  Place under the ankle to keep leg elevated and to keep knee straight when not working on range of motion.  Keeping pillows under the back of your knee can result in a knee flexion contracture  Bone health:  Labs show that you have significant vitamin D deficiency.  Continue with vitamin D supplementation.  You were prescribed 2 different types of vitamin D take pulse until instructed otherwise.  Wound Care: Daily wound care starting now as needed.  Okay to leave wounds open to the air if there is no drainage.  Clean wounds with soap and water only.  Please see below  Discharge Wound Care Instructions  Do NOT apply any ointments, solutions or lotions to pin sites or surgical wounds.  These prevent needed drainage and even though solutions like hydrogen peroxide kill bacteria, they also damage cells lining the pin sites that help fight infection.  Applying lotions or ointments can keep the wounds moist and can cause them  to breakdown and open up as well. This can increase the risk for infection. When in doubt call the office.  Surgical incisions should be dressed daily.  If any drainage is noted, use one layer of adaptic, then gauze and tape.  Alternatively you can use a Mepilex type dressing.  Would also recommend TED hose for swelling control  or similar compression garment  Once the incision is completely dry and without drainage, it may be left open to air out.  Showering may begin 36-48 hours later.  Cleaning gently with soap and water.   DVT/PE prophylaxis: Xarelto 10 mg po daily x 30 days  Diet: as you were eating previously.  Can use over the counter stool softeners and bowel preparations, such as Miralax, to help with bowel movements.  Narcotics can be constipating.  Be sure to drink plenty of fluids  PAIN MEDICATION USE AND EXPECTATIONS  You have likely been given narcotic medications to help control your pain.  After a traumatic event that results in an fracture (broken bone) with or without surgery, it is ok to use narcotic pain medications to help control one's pain.  We understand that everyone responds to pain differently and each individual patient will be evaluated on a regular basis for the continued need for narcotic medications. Ideally, narcotic medication use should last no more than 6-8 weeks (coinciding with fracture healing).   As a patient it is your responsibility as well to monitor narcotic medication use and report the amount and frequency you use these medications when you come to your office visit.   We would also advise that if you are using narcotic medications, you should take a dose prior to therapy to maximize you participation.  IF YOU ARE ON NARCOTIC MEDICATIONS IT IS NOT PERMISSIBLE TO OPERATE A MOTOR VEHICLE (MOTORCYCLE/CAR/TRUCK/MOPED) OR HEAVY MACHINERY DO NOT MIX NARCOTICS WITH OTHER CNS (CENTRAL NERVOUS SYSTEM) DEPRESSANTS SUCH AS ALCOHOL   STOP SMOKING OR USING NICOTINE PRODUCTS!!!!  As discussed nicotine severely impairs your body's ability to heal surgical and traumatic wounds but also impairs bone healing.  Wounds and bone heal by forming microscopic blood vessels (angiogenesis) and nicotine is a vasoconstrictor (essentially, shrinks blood vessels).  Therefore, if vasoconstriction occurs to  these microscopic blood vessels they essentially disappear and are unable to deliver necessary nutrients to the healing tissue.  This is one modifiable factor that you can do to dramatically increase your chances of healing your injury.    (This means no smoking, no nicotine gum, patches, etc)  DO NOT USE NONSTEROIDAL ANTI-INFLAMMATORY DRUGS (NSAID'S)  Using products such as Advil (ibuprofen), Aleve (naproxen), Motrin (ibuprofen) for additional pain control during fracture healing can delay and/or prevent the healing response.  If you would like to take over the counter (OTC) medication, Tylenol (acetaminophen) is ok.  However, some narcotic medications that are given for pain control contain acetaminophen as well. Therefore, you should not exceed more than 4000 mg of tylenol in a day if you do not have liver disease.  Also note that there are may OTC medicines, such as cold medicines and allergy medicines that my contain tylenol as well.  If you have any questions about medications and/or interactions please ask your doctor/PA or your pharmacist.      ICE AND ELEVATE INJURED/OPERATIVE EXTREMITY  Using ice and elevating the injured extremity above your heart can help with swelling and pain control.  Icing in a pulsatile fashion, such as 20 minutes on and 20 minutes  off, can be followed.    Do not place ice directly on skin. Make sure there is a barrier between to skin and the ice pack.    Using frozen items such as frozen peas works well as the conform nicely to the are that needs to be iced.  USE AN ACE WRAP OR TED HOSE FOR SWELLING CONTROL  In addition to icing and elevation, Ace wraps or TED hose are used to help limit and resolve swelling.  It is recommended to use Ace wraps or TED hose until you are informed to stop.    When using Ace Wraps start the wrapping distally (farthest away from the body) and wrap proximally (closer to the body)   Example: If you had surgery on your leg or thing and you  do not have a splint on, start the ace wrap at the toes and work your way up to the thigh        If you had surgery on your upper extremity and do not have a splint on, start the ace wrap at your fingers and work your way up to the upper arm  IF YOU ARE IN A SPLINT OR CAST DO NOT [place]   If your splint gets wet for any reason please contact the office immediately. You may shower in your splint or cast as long as you keep it dry.  This can be done by wrapping in a cast cover or garbage back (or similar)  Do Not stick any thing down your splint or cast such as pencils, money, or hangers to try and scratch yourself with.  If you feel itchy take benadryl as prescribed on the bottle for itching  IF YOU ARE IN A CAM BOOT (BLACK BOOT)  You may remove boot periodically. Perform daily dressing changes as noted below.  Wash the liner of the boot regularly and wear a sock when wearing the boot. It is recommended that you sleep in the boot until told otherwise    Call office for the following:  Temperature greater than 101F  Persistent nausea and vomiting  Severe uncontrolled pain  Redness, tenderness, or signs of infection (pain, swelling, redness, odor or green/yellow discharge around the site)  Difficulty breathing, headache or visual disturbances  Hives  Persistent dizziness or light-headedness  Extreme fatigue  Any other questions or concerns you may have after discharge  In an emergency, call 911 or go to an Emergency Department at a nearby hospital    [place]: [phone]   VISIT OUR WEBSITE FOR ADDITIONAL INFORMATION: [email or web address]
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
## 2019-03-12 NOTE — TOC Progression Note (Signed)
Transition of Care [facility]) - Progression Note    Patient Details  Name: Lauren Garrison MRN: [id number] Date of Birth: January 29, 1961  [facility] CM/SW [place], [place] Phone Number: 03/12/2019, 2:19 PM  Clinical Narrative:    CSW spoke with admission coordinator at [facility] to begin insurance auth for pt. Admission for SNF. TOC team will continue to follow for disposition planning.   Expected Discharge Plan: [place] Barriers to Discharge: Continued Medical Work up, Ship broker  Expected Discharge Plan and Services Expected Discharge Plan: [place] In-house Referral: Clinical Social Work Discharge Planning Services: CM Consult Post Acute Care Choice: [place], [place] arrangements for the past 2 months: Single Family Home Expected Discharge Date: 03/11/19                                     Social Determinants of Health (SDOH) Interventions    Readmission Risk Interventions Readmission Risk Prevention Plan 03/08/2019  Post Dischage Appt Not Complete  Appt Comments disposition pending  Medication Screening Complete  Transportation Screening Complete
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
## 2019-03-13 LAB — CBC
HCT: 30.3 % — ABNORMAL LOW (ref 36.0–46.0)
Hemoglobin: 9.2 g/dL — ABNORMAL LOW (ref 12.0–15.0)
MCH: 29.4 pg (ref 26.0–34.0)
MCHC: 30.4 g/dL (ref 30.0–36.0)
MCV: 96.8 fL (ref 80.0–100.0)
Platelets: 289 10*3/uL (ref 150–400)
RBC: 3.13 MIL/uL — ABNORMAL LOW (ref 3.87–5.11)
RDW: 16.2 % — ABNORMAL HIGH (ref 11.5–15.5)
WBC: 6.4 10*3/uL (ref 4.0–10.5)
nRBC: 1.1 % — ABNORMAL HIGH (ref 0.0–0.2)

## 2019-03-13 NOTE — Clinical Social Work Note (Signed)
Transitions of Care Supervisor spoke with patient over the phone per patient request.  Patient had legitimate concerns regarding liability coverage and SNF placement.  TOC supervisor provided the information requested and patient expressed understanding.  Patient is now agreeable with placement at [facility] and hopeful for a short stay prior to returning home.  Patient very receptive over the phone and appreciative for follow up.  TOC team remains available for support and to facilitate patient discharge needs once medically stable and insurance authorization received.  Barbette Or, [place]

## 2019-03-13 NOTE — Progress Notes (Signed)
Orthopaedic Trauma Service (OTS)  10 Days Post-Op Procedure(s) (LRB): OPEN REDUCTION INTERNAL FIXATION (ORIF) DISTAL FEMUR FRACTURE (Left)  Subjective: Patient reports pain as mild.    Objective: Current Vitals Blood pressure (!) 157/68, pulse 87, temperature 98.3 F (36.8 C), temperature source Oral, resp. rate 18, height 5\' 5"  (1.651 m), weight 99.8 kg, SpO2 99 %. Vital signs in last 24 hours: Temp:  [98.3 F (36.8 C)-98.8 F (37.1 C)] 98.3 F (36.8 C) (01/24 0640) Pulse Rate:  [81-90] 87 (01/24 0640) Resp:  [18] 18 (01/24 0640) BP: (110-157)/(47-68) 157/68 (01/24 0640) SpO2:  [94 %-99 %] 99 % (01/24 0640)  Intake/Output from previous day: 01/23 0701 - 01/24 0700 In: 720 [P.O.:720] Out: 2000 [Urine:2000]  LABS Recent Labs    03/13/19 0412  HGB 9.2*   Recent Labs    03/13/19 0412  WBC 6.4  RBC 3.13*  HCT 30.3*  PLT 289   No results for input(s): NA, K, CL, CO2, BUN, CREATININE, GLUCOSE, CALCIUM in the last 72 hours. No results for input(s): LABPT, INR in the last 72 hours.   Physical Exam LLE  Dressing intact, clean, dry  Edema/ swelling controlled  Sens: DPN, SPN, TN intact  Motor: EHL, FHL, and lessor toe ext and flex all intact grossly  Brisk cap refill, warm to touch  Knee flexion only 5-30 degrees, but states better when seated  Assessment/Plan: 10 Days Post-Op Procedure(s) (LRB): OPEN REDUCTION INTERNAL FIXATION (ORIF) DISTAL FEMUR FRACTURE (Left) 1. PT/OT NWB LLE 2. DVT proph Xarelto 3. D/c to SNF tomorrow and F/u 8-14 days 4. D/c further labs  Altamese [place], MD [facility] [phone] (p)

## 2019-03-14 LAB — SARS CORONAVIRUS 2 (TAT 6-24 HRS): SARS Coronavirus 2: NEGATIVE

## 2019-03-14 NOTE — TOC Progression Note (Signed)
Transition of Care [facility]) - Progression Note    Patient Details  Name: Kenzli Cornella MRN: [id number] Date of Birth: 1960/08/19  Transition of Care [facility]) CM/SW [place], [place] Phone Number: 03/14/2019, 8:37 AM  Clinical Narrative:    Josem Kaufmann pending for pt placement at [facility], pt had requested [facility] team to see if any further options had become available- no further offers at this time.    Expected Discharge Plan: [place] Barriers to Discharge: Continued Medical Work up, Ship broker  Expected Discharge Plan and Services Expected Discharge Plan: [place] In-house Referral: Clinical Social Work Discharge Planning Services: CM Consult Post Acute Care Choice: [place], [place] arrangements for the past 2 months: Single Family Home Expected Discharge Date: 03/11/19                                     Social Determinants of Health (SDOH) Interventions    Readmission Risk Interventions Readmission Risk Prevention Plan 03/08/2019  Post Dischage Appt Not Complete  Appt Comments disposition pending  Medication Screening Complete  Transportation Screening Complete

## 2019-03-14 NOTE — Progress Notes (Signed)
RN's bedside. Patient in chair. Patient verbalizing desire to use purewick catheter rather than bedside commode with assistance. RN educated pt; pt refuses bedside commode at this time.
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
## 2019-03-14 NOTE — Social Work (Signed)
Additional PT note sent to SNF- auth still pending at this time; appreciate therapy assistance.   Westley Hummer, MSW, [place] Work

## 2019-03-15 NOTE — Progress Notes (Signed)
RN to bedside to remove purewick. Educated patient again regarding removal of purewick and the need for her to ambulate as she will likely be going to rehab today and will not have a purewick. Informed her the MD has ordered NO PUREWICK. She states, "She didn't care if the MD was mad, she is the patient." I informed her she can discuss this with the MD.
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
## 2019-03-15 NOTE — TOC Transition Note (Signed)
Transition of Care [facility]) - CM/SW Discharge Note   Patient Details  Name: Lauren Garrison MRN: [id number] Date of Birth: 1960-09-15  Transition of Care [facility]) CM/SW Contact:  [place], [place] Phone Number: 03/15/2019, 11:59 AM   Clinical Narrative:    CSW spoke with pt on room phone; let her know that [facility] authorization had been received by SNF. She is aware we are waiting on updated dc summary and will schedule PTAR after that. Will provide pt/pt RN with timing when scheduled.    Final next level of care: Skilled Nursing Facility Barriers to Discharge: Barriers Resolved   Patient Goals and CMS Choice Patient states their goals for this hospitalization and ongoing recovery are:: to get rehab CMS Medicare.gov Compare Post Acute Care list provided to:: Patient Choice offered to / list presented to : Patient  Discharge Placement PASRR number recieved: 03/11/19            Patient chooses bed at: Other - please specify in the comment section below:([place]) Patient to be transferred to facility by: [place] Name of family member notified: pt will inform her family Patient and family notified of of transfer: 03/15/19  Discharge Plan and Services In-house Referral: Clinical Social Work Discharge Planning Services: CM Consult Post Acute Care Choice: [place], IP Rehab            Readmission Risk Interventions Readmission Risk Prevention Plan 03/08/2019  Post Dischage Appt Not Complete  Appt Comments disposition pending  Medication Screening Complete  Transportation Screening Complete

## 2019-03-15 NOTE — Progress Notes (Signed)
Report called to Marya Amsler, Therapist, sports at [facility]. All questions answered. Patient is awaiting PTAR arrival.

## 2019-03-15 NOTE — TOC Progression Note (Addendum)
Transition of Care [facility]) - Progression Note    Patient Details  Name: Lauren Garrison MRN: [id number] Date of Birth: [birth year]  Transition of Care [facility]) CM/SW [place], [place] Phone Number: 03/15/2019, 8:37 AM  Clinical Narrative:    10:29am- CSW spoke with Kizzie Ide received. Pt able to admit to SNF today.   8:37am- CSW has left message with Ebony Hail, admissions liaison at [facility] pending. New negative COVID has resulted.   Expected Discharge Plan: [place] Barriers to Discharge: Continued Medical Work up, Ship broker  Expected Discharge Plan and Services Expected Discharge Plan: [place] In-house Referral: Clinical Social Work Discharge Planning Services: CM Consult Post Acute Care Choice: [place], [place] arrangements for the past 2 months: Single Family Home Expected Discharge Date: 03/11/19                 Readmission Risk Interventions Readmission Risk Prevention Plan 03/08/2019  Post Dischage Appt Not Complete  Appt Comments disposition pending  Medication Screening Complete  Transportation Screening Complete

## 2019-03-15 NOTE — Plan of Care (Signed)

## 2019-03-15 NOTE — Social Work (Signed)
Clinical Social Worker facilitated patient discharge including contacting patient family and facility to confirm patient discharge plans.  Clinical information faxed to facility and family agreeable with plan.  CSW arranged ambulance transport via [place] to [place] at [facility] to call [phone]  with report prior to discharge.  Clinical Social Worker will sign off for now as social work intervention is no longer needed. Please consult [place] again if new need arises.  Westley Hummer, MSW, [place] Social Worker

## 2019-03-24 ENCOUNTER — Encounter: Payer: Self-pay | Admitting: Anesthesiology

## 2019-03-24 NOTE — Addendum Note (Signed)
Addendum  created 03/24/19 1051 by Roberts Gaudy, MD   Intraprocedure Event edited, Intraprocedure Staff edited

## 2019-07-19 ENCOUNTER — Other Ambulatory Visit: Payer: Self-pay

## 2019-07-19 ENCOUNTER — Encounter: Payer: Self-pay | Admitting: Internal Medicine

## 2019-07-19 ENCOUNTER — Ambulatory Visit (INDEPENDENT_AMBULATORY_CARE_PROVIDER_SITE_OTHER): Payer: 59 | Admitting: Internal Medicine

## 2019-07-19 ENCOUNTER — Telehealth: Payer: Self-pay | Admitting: Internal Medicine

## 2019-07-19 ENCOUNTER — Encounter: Payer: Self-pay | Admitting: Gastroenterology

## 2019-07-19 VITALS — BP 160/80 | HR 99 | Temp 98.1°F | Ht 65.0 in | Wt 246.7 lb

## 2019-07-19 DIAGNOSIS — C50912 Malignant neoplasm of unspecified site of left female breast: Secondary | ICD-10-CM

## 2019-07-19 DIAGNOSIS — Z853 Personal history of malignant neoplasm of breast: Secondary | ICD-10-CM

## 2019-07-19 DIAGNOSIS — Z124 Encounter for screening for malignant neoplasm of cervix: Secondary | ICD-10-CM | POA: Diagnosis not present

## 2019-07-19 DIAGNOSIS — Z1211 Encounter for screening for malignant neoplasm of colon: Secondary | ICD-10-CM | POA: Diagnosis not present

## 2019-07-19 DIAGNOSIS — I10 Essential (primary) hypertension: Secondary | ICD-10-CM

## 2019-07-19 MED ORDER — AMLODIPINE BESYLATE 10 MG PO TABS: 10.0000 mg | ORAL_TABLET | Freq: Every day | ORAL | 1 refills | Status: DC

## 2019-07-19 NOTE — Telephone Encounter (Signed)
Pt forgot to get work note when she was seen for her visit. Pt states note was to be out and return to work on July 1st.  [phone]

## 2019-07-19 NOTE — Telephone Encounter (Signed)
Letter sent to [facility] and copy mailed to home address per patient request.
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
## 2019-07-19 NOTE — Telephone Encounter (Signed)
Ok to stay out of work for 30 days to return on July 1st.

## 2019-07-19 NOTE — Patient Instructions (Signed)
-  Nice seeing you today!!  -Increase amlodipine to 10 mg daily.  -See you back in 6 weeks for follow up of your blood pressure.  -Mammogram, GI and GYN referrals have been scheduled for today.

## 2019-07-22 ENCOUNTER — Other Ambulatory Visit: Payer: Self-pay | Admitting: Internal Medicine

## 2019-07-22 DIAGNOSIS — Z853 Personal history of malignant neoplasm of breast: Secondary | ICD-10-CM

## 2019-08-02 ENCOUNTER — Other Ambulatory Visit: Payer: Self-pay

## 2019-08-03 ENCOUNTER — Encounter: Payer: Self-pay | Admitting: Nurse Practitioner

## 2019-08-03 ENCOUNTER — Telehealth: Payer: Self-pay | Admitting: *Deleted

## 2019-08-03 ENCOUNTER — Ambulatory Visit (INDEPENDENT_AMBULATORY_CARE_PROVIDER_SITE_OTHER): Payer: 59 | Admitting: Nurse Practitioner

## 2019-08-03 VITALS — BP 130/84 | Ht 64.0 in | Wt 241.0 lb

## 2019-08-03 DIAGNOSIS — N63 Unspecified lump in unspecified breast: Secondary | ICD-10-CM

## 2019-08-03 DIAGNOSIS — Z1151 Encounter for screening for human papillomavirus (HPV): Secondary | ICD-10-CM

## 2019-08-03 DIAGNOSIS — Z853 Personal history of malignant neoplasm of breast: Secondary | ICD-10-CM

## 2019-08-03 DIAGNOSIS — Z01419 Encounter for gynecological examination (general) (routine) without abnormal findings: Secondary | ICD-10-CM

## 2019-08-03 NOTE — Telephone Encounter (Signed)
-----   Message from Lauren Gammon, NP sent at 08/03/2019  1:01 PM EDT ----- Please schedule bilateral diagnostic mammogram. History of left breast cancer. Current bilateral masses.

## 2019-08-03 NOTE — Patient Instructions (Signed)
Health Maintenance, Female Adopting a healthy lifestyle and getting preventive care are important in promoting health and wellness. Ask your health care provider about:  The right schedule for you to have regular tests and exams.  Things you can do on your own to prevent diseases and keep yourself healthy. What should I know about diet, weight, and exercise? Eat a healthy diet   Eat a diet that includes plenty of vegetables, fruits, low-fat dairy products, and lean protein.  Do not eat a lot of foods that are high in solid fats, added sugars, or sodium. Maintain a healthy weight Body mass index (BMI) is used to identify weight problems. It estimates body fat based on height and weight. Your health care provider can help determine your BMI and help you achieve or maintain a healthy weight. Get regular exercise Get regular exercise. This is one of the most important things you can do for your health. Most adults should:  Exercise for at least 150 minutes each week. The exercise should increase your heart rate and make you sweat (moderate-intensity exercise).  Do strengthening exercises at least twice a week. This is in addition to the moderate-intensity exercise.  Spend less time sitting. Even light physical activity can be beneficial. Watch cholesterol and blood lipids Have your blood tested for lipids and cholesterol at 59 years of age, then have this test every 5 years. Have your cholesterol levels checked more often if:  Your lipid or cholesterol levels are high.  You are older than 59 years of age.  You are at high risk for heart disease. What should I know about cancer screening? Depending on your health history and family history, you may need to have cancer screening at various ages. This may include screening for:  Breast cancer.  Cervical cancer.  Colorectal cancer.  Skin cancer.  Lung cancer. What should I know about heart disease, diabetes, and high blood  pressure? Blood pressure and heart disease  High blood pressure causes heart disease and increases the risk of stroke. This is more likely to develop in people who have high blood pressure readings, are of African descent, or are overweight.  Have your blood pressure checked: ? Every 3-5 years if you are 18-39 years of age. ? Every year if you are 40 years old or older. Diabetes Have regular diabetes screenings. This checks your fasting blood sugar level. Have the screening done:  Once every three years after age 40 if you are at a normal weight and have a low risk for diabetes.  More often and at a younger age if you are overweight or have a high risk for diabetes. What should I know about preventing infection? Hepatitis B If you have a higher risk for hepatitis B, you should be screened for this virus. Talk with your health care provider to find out if you are at risk for hepatitis B infection. Hepatitis C Testing is recommended for:  Everyone born from 1945 through 1965.  Anyone with known risk factors for hepatitis C. Sexually transmitted infections (STIs)  Get screened for STIs, including gonorrhea and chlamydia, if: ? You are sexually active and are younger than 59 years of age. ? You are older than 59 years of age and your health care provider tells you that you are at risk for this type of infection. ? Your sexual activity has changed since you were last screened, and you are at increased risk for chlamydia or gonorrhea. Ask your health care provider if   you are at risk.  Ask your health care provider about whether you are at high risk for HIV. Your health care provider may recommend a prescription medicine to help prevent HIV infection. If you choose to take medicine to prevent HIV, you should first get tested for HIV. You should then be tested every 3 months for as long as you are taking the medicine. Pregnancy  If you are about to stop having your period (premenopausal) and  you may become pregnant, seek counseling before you get pregnant.  Take 400 to 800 micrograms (mcg) of folic acid every day if you become pregnant.  Ask for birth control (contraception) if you want to prevent pregnancy. Osteoporosis and menopause Osteoporosis is a disease in which the bones lose minerals and strength with aging. This can result in bone fractures. If you are 65 years old or older, or if you are at risk for osteoporosis and fractures, ask your health care provider if you should:  Be screened for bone loss.  Take a calcium or vitamin D supplement to lower your risk of fractures.  Be given hormone replacement therapy (HRT) to treat symptoms of menopause. Follow these instructions at home: Lifestyle  Do not use any products that contain nicotine or tobacco, such as cigarettes, e-cigarettes, and chewing tobacco. If you need help quitting, ask your health care provider.  Do not use street drugs.  Do not share needles.  Ask your health care provider for help if you need support or information about quitting drugs. Alcohol use  Do not drink alcohol if: ? Your health care provider tells you not to drink. ? You are pregnant, may be pregnant, or are planning to become pregnant.  If you drink alcohol: ? Limit how much you use to 0-1 drink a day. ? Limit intake if you are breastfeeding.  Be aware of how much alcohol is in your drink. In the [place] one drink equals one 12 oz bottle of beer (355 mL), one 5 oz glass of wine (148 mL), or one 1 oz glass of hard liquor (44 mL). General instructions  Schedule regular health, dental, and eye exams.  Stay current with your vaccines.  Tell your health care provider if: ? You often feel depressed. ? You have ever been abused or do not feel safe at home. Summary  Adopting a healthy lifestyle and getting preventive care are important in promoting health and wellness.  Follow your health care provider's instructions about healthy  diet, exercising, and getting tested or screened for diseases.  Follow your health care provider's instructions on monitoring your cholesterol and blood pressure. This information is not intended to replace advice given to you by your health care provider. Make sure you discuss any questions you have with your health care provider. Document Revised: 01/27/2018 Document Reviewed: 01/27/2018 Elsevier Patient Education  2020 Elsevier Inc.  

## 2019-08-03 NOTE — Telephone Encounter (Signed)
I called to discuss with patient breast center protocol about needing films before scheduling. [place] and her PCP Dr.Hernandez already placed orders for the below imaging. Patient said she did reach out to her previous breast imaging center out of state and they are aware that the breast center needs films. I explained she could reach out to the out of state imaging center and see if they can expedite films. I will place new orders so imaging will come back under Lauren Garrison's name.
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
## 2019-08-05 LAB — PAP, TP IMAGING W/ HPV RNA, RFLX HPV TYPE 16,18/45: HPV DNA High Risk: NOT DETECTED

## 2019-08-15 ENCOUNTER — Other Ambulatory Visit: Payer: Self-pay

## 2019-08-15 ENCOUNTER — Ambulatory Visit
Admission: RE | Admit: 2019-08-15 | Discharge: 2019-08-15 | Disposition: A | Discharge status: Home / Self Care | Payer: 59 | Source: Ambulatory Visit | Attending: Nurse Practitioner | Admitting: Nurse Practitioner

## 2019-08-15 ENCOUNTER — Ambulatory Visit
Admission: RE | Admit: 2019-08-15 | Discharge: 2019-08-15 | Disposition: A | Discharge status: Home / Self Care | Payer: 59 | Source: Ambulatory Visit | Attending: Internal Medicine | Admitting: Internal Medicine

## 2019-08-15 ENCOUNTER — Other Ambulatory Visit: Payer: Self-pay | Admitting: Nurse Practitioner

## 2019-08-15 DIAGNOSIS — Z853 Personal history of malignant neoplasm of breast: Secondary | ICD-10-CM

## 2019-08-15 DIAGNOSIS — N63 Unspecified lump in unspecified breast: Secondary | ICD-10-CM

## 2019-08-15 HISTORY — DX: Personal history of irradiation: Z92.3

## 2019-08-15 NOTE — Telephone Encounter (Signed)
Patient scheduled for breast imaging today at the breast center.

## 2019-08-18 ENCOUNTER — Ambulatory Visit (AMBULATORY_SURGERY_CENTER): Payer: Self-pay | Admitting: *Deleted

## 2019-08-18 ENCOUNTER — Other Ambulatory Visit: Payer: Self-pay

## 2019-08-18 VITALS — Ht 64.0 in | Wt 236.2 lb

## 2019-08-18 DIAGNOSIS — Z01818 Encounter for other preprocedural examination: Secondary | ICD-10-CM

## 2019-08-18 DIAGNOSIS — Z1211 Encounter for screening for malignant neoplasm of colon: Secondary | ICD-10-CM

## 2019-08-18 MED ORDER — SUTAB 1479-225-188 MG PO TABS: 1.0000 | ORAL_TABLET | Freq: Once | ORAL | 0 refills | Status: AC

## 2019-08-18 NOTE — Progress Notes (Signed)
Pt has hx of PONV (after first breast cancer surgery), no trouble with difficult intubation, or fam hx/hx malignant hyperthermia  covid test 08-29-19 at 2:00 pm,   No egg or soy allergy  No home oxygen use   No medications for weight loss taken  emmi information given  Pt denies constipation issues   No trouble with anesthesia, difficulty with intubation or hx/fam hx of malignant hyperthermia per pt  Pt is aware that care partner will wait in the car during procedure; if they feel like they will be too hot or cold to wait in the car; they may wait in the 4 th floor lobby. Patient is aware to bring only one care partner. We want them to wear a mask (we do not have any that we can provide them), practice social distancing, and we will check their temperatures when they get here.  I did remind the patient that their care partner needs to stay in the parking lot the entire time and have a cell phone available, we will call them when the pt is ready for discharge. Patient will wear mask into building.  clarified prep instructions for day of procedure    Sutab code put into RX and paper copy given to pt to show pharmacy

## 2019-08-24 ENCOUNTER — Other Ambulatory Visit: Payer: Self-pay

## 2019-08-24 ENCOUNTER — Other Ambulatory Visit: Payer: Self-pay | Admitting: Nurse Practitioner

## 2019-08-24 ENCOUNTER — Ambulatory Visit
Admission: RE | Admit: 2019-08-24 | Discharge: 2019-08-24 | Disposition: A | Discharge status: Home / Self Care | Payer: 59 | Source: Ambulatory Visit | Attending: Nurse Practitioner | Admitting: Nurse Practitioner

## 2019-08-24 DIAGNOSIS — N63 Unspecified lump in unspecified breast: Secondary | ICD-10-CM

## 2019-08-24 DIAGNOSIS — Z853 Personal history of malignant neoplasm of breast: Secondary | ICD-10-CM

## 2019-08-25 ENCOUNTER — Encounter: Payer: Self-pay | Admitting: *Deleted

## 2019-08-25 ENCOUNTER — Other Ambulatory Visit: Payer: 59

## 2019-08-26 ENCOUNTER — Encounter: Payer: Self-pay | Admitting: Gastroenterology

## 2019-08-26 ENCOUNTER — Other Ambulatory Visit: Payer: Self-pay | Admitting: *Deleted

## 2019-08-26 DIAGNOSIS — Z170 Estrogen receptor positive status [ER+]: Secondary | ICD-10-CM

## 2019-08-26 DIAGNOSIS — C50412 Malignant neoplasm of upper-outer quadrant of left female breast: Secondary | ICD-10-CM

## 2019-08-29 ENCOUNTER — Other Ambulatory Visit: Payer: Self-pay | Admitting: Gastroenterology

## 2019-08-29 ENCOUNTER — Ambulatory Visit (INDEPENDENT_AMBULATORY_CARE_PROVIDER_SITE_OTHER): Payer: 59

## 2019-08-29 DIAGNOSIS — Z1159 Encounter for screening for other viral diseases: Secondary | ICD-10-CM

## 2019-08-30 LAB — SARS CORONAVIRUS 2 (TAT 6-24 HRS): SARS Coronavirus 2: NEGATIVE

## 2019-08-30 NOTE — Progress Notes (Signed)
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 deployed into the biopsy cavity. Follow up 2  view mammogram was performed and dictated separately. Lesion quadrant: Left axilla Using sterile technique and 1% Lidocaine as local anesthetic, under direct ultrasound visualization, a 14 gauge spring-loaded device was used to perform biopsy of an abnormal left axillary node using a lateral approach. At the conclusion of the procedure a Q shaped tissue marker clip was deployed into the biopsy cavity. Follow up 2 view mammogram was performed and dictated separately. IMPRESSION: Ultrasound guided biopsy of a left breast mass at 2 o'clock and an abnormal left axillary node. No apparent complications. Electronically Signed: By: Dorise Bullion III M.D On:  08/24/2019 08:29     ELIGIBLE FOR AVAILABLE RESEARCH PROTOCOL: AET   ASSESSMENT: 59 y.o. [place], [place] woman   (1) status post left lumpectomy September 2005 at [facility] in [place]  (a) status post adjuvant radiation  (2) status post left breast upper outer quadrant biopsy 08/24/2019 for a clinical T2 N1, stage IIA invasive ductal carcinoma, grade 2, E-cadherin positive, estrogen and progesterone receptor strongly positive, HER-2 not amplified, with an MIB-1 of 20%  (3) definitive surgery pending  (4) genetics testing  (5) consider adjuvant radiation depending on prior treatment records  (6) MammaPrint to be obtained from the initial biopsy  (7) antiestrogens to start at the completion of local treatment   PLAN: I spent approximately 60 minutes face to face with [facility] with more than 50% of that time spent in counseling and coordination of care. Specifically we reviewed the biology of the patient's diagnosis and the specifics of her situation.  We first reviewed the fact that cancer is not one disease but more than 100 different diseases and that it is important to keep them separate-- otherwise when friends and relatives discuss their own cancer experiences with [facility] confusion can result. Similarly we explained that if breast cancer spreads to the bone or liver, the patient would not have bone cancer or liver cancer, but breast cancer in the bone and breast cancer in the liver: one cancer in three places-- not 3 different cancers which otherwise would have to be treated in 3 different ways.  We discussed the difference between local and systemic therapy. In terms of loco-regional treatment, lumpectomy plus radiation is equivalent to mastectomy as far as survival is concerned. For this reason, and because the cosmetic results are generally superior, we generally recommend breast conserving surgery.  However in Eleora's case as she has had prior radiation to  this breast and the standard of care in this situation is mastectomy, which is indeed our recommendation.    We then discussed the rationale for systemic therapy. There is some risk that this cancer may have already spread to other parts of her body. Patients frequently ask at this point about bone scans, CAT scans and PET scans to find out if they have occult breast cancer somewhere else. The problem is that in early stage disease we are much more likely to find false positives then true cancers and this would expose the patient to unnecessary procedures as well as unnecessary radiation. Scans cannot answer the question the patient really would like to know, which is whether she has microscopic disease elsewhere in her body. For those reasons we do not recommend them.  Of course we would proceed to aggressive evaluation of any symptoms that might suggest metastatic disease, but that is not the case here.  Next we went over the options for systemic therapy which are anti-estrogens, anti-HER-2 immunotherapy, and chemotherapy. Moselle does not meet  criteria for anti-HER-2 immunotherapy. She is a good candidate for anti-estrogens.  The question of chemotherapy is more complicated. Chemotherapy is most effective in rapidly growing, aggressive tumors. It is much less effective in low-grade, slow growing cancers, like Raima 's. For that reason we are going to request a MammaPrint study from the original biopsy, as suggested by NCCN guidelines. That will help [place] make a definitive decision regarding chemotherapy in this case.  Chalene also qualifies for genetics testing and this is being operationalize  Tamitha has a good understanding of the overall plan. She agrees with it. She knows the goal of treatment in her case is cure. She will call with any problems that may develop before her next visit here.  Total encounter time 70 minutes.*  Magrinat, Virgie Dad, MD  08/31/19 3:15 PM Medical Oncology and  Hematology [facility] [place], [place] [address] Tel. [phone]    Fax. [phone]   I, Jacqualyn Posey am acting as a Education administrator for Chauncey Cruel, MD.   I, Lurline Del MD, have reviewed the above documentation for accuracy and completeness, and I agree with the above.     *Total Encounter Time as defined by the Centers for Medicare and Medicaid Services includes, in addition to the face-to-face time of a patient visit (documented in the note above) non-face-to-face time: obtaining and reviewing outside history, ordering and reviewing medications, tests or procedures, care coordination (communications with other health care professionals or caregivers) and documentation in the medical record.

## 2019-08-31 ENCOUNTER — Other Ambulatory Visit: Payer: Self-pay | Admitting: *Deleted

## 2019-08-31 ENCOUNTER — Ambulatory Visit
Admission: RE | Admit: 2019-08-31 | Discharge: 2019-08-31 | Disposition: A | Discharge status: Home / Self Care | Payer: 59 | Source: Ambulatory Visit | Attending: Radiation Oncology | Admitting: Radiation Oncology

## 2019-08-31 ENCOUNTER — Encounter: Payer: Self-pay | Admitting: Genetic Counselor

## 2019-08-31 ENCOUNTER — Inpatient Hospital Stay (HOSPITAL_BASED_OUTPATIENT_CLINIC_OR_DEPARTMENT_OTHER): Payer: 59 | Admitting: Genetic Counselor

## 2019-08-31 ENCOUNTER — Encounter: Payer: Self-pay | Admitting: *Deleted

## 2019-08-31 ENCOUNTER — Telehealth: Payer: Self-pay | Admitting: *Deleted

## 2019-08-31 ENCOUNTER — Encounter: Payer: Self-pay | Admitting: Oncology

## 2019-08-31 ENCOUNTER — Inpatient Hospital Stay: Payer: 59

## 2019-08-31 ENCOUNTER — Other Ambulatory Visit: Payer: Self-pay

## 2019-08-31 ENCOUNTER — Inpatient Hospital Stay: Payer: 59 | Attending: Oncology | Admitting: Oncology

## 2019-08-31 ENCOUNTER — Ambulatory Visit: Payer: Self-pay | Admitting: General Surgery

## 2019-08-31 ENCOUNTER — Telehealth: Payer: Self-pay | Admitting: Internal Medicine

## 2019-08-31 VITALS — BP 153/76 | HR 87 | Temp 98.7°F | Resp 18 | Ht 64.0 in | Wt 239.9 lb

## 2019-08-31 DIAGNOSIS — C773 Secondary and unspecified malignant neoplasm of axilla and upper limb lymph nodes: Secondary | ICD-10-CM | POA: Insufficient documentation

## 2019-08-31 DIAGNOSIS — Z170 Estrogen receptor positive status [ER+]: Secondary | ICD-10-CM

## 2019-08-31 DIAGNOSIS — C50412 Malignant neoplasm of upper-outer quadrant of left female breast: Secondary | ICD-10-CM

## 2019-08-31 DIAGNOSIS — C50912 Malignant neoplasm of unspecified site of left female breast: Secondary | ICD-10-CM | POA: Diagnosis not present

## 2019-08-31 DIAGNOSIS — R32 Unspecified urinary incontinence: Secondary | ICD-10-CM

## 2019-08-31 DIAGNOSIS — I10 Essential (primary) hypertension: Secondary | ICD-10-CM | POA: Insufficient documentation

## 2019-08-31 LAB — CMP (CANCER CENTER ONLY)
ALT: 24 U/L (ref 0–44)
AST: 19 U/L (ref 15–41)
Albumin: 3.4 g/dL — ABNORMAL LOW (ref 3.5–5.0)
Alkaline Phosphatase: 110 U/L (ref 38–126)
Anion gap: 10 (ref 5–15)
BUN: 16 mg/dL (ref 6–20)
CO2: 26 mmol/L (ref 22–32)
Calcium: 9 mg/dL (ref 8.9–10.3)
Chloride: 106 mmol/L (ref 98–111)
Creatinine: 0.79 mg/dL (ref 0.44–1.00)
GFR, Est AFR Am: >60 mL/min (ref >60)
GFR, Estimated: >60 mL/min (ref >60)
Glucose, Bld: 109 mg/dL — ABNORMAL HIGH (ref 70–99)
Potassium: 3.5 mmol/L (ref 3.5–5.1)
Sodium: 142 mmol/L (ref 135–145)
Total Bilirubin: 0.4 mg/dL (ref 0.3–1.2)
Total Protein: 7.5 g/dL (ref 6.5–8.1)

## 2019-08-31 LAB — CBC WITH DIFFERENTIAL (CANCER CENTER ONLY)
Abs Immature Granulocytes: 0 10*3/uL (ref 0.00–0.07)
Basophils Absolute: 0 10*3/uL (ref 0.0–0.1)
Basophils Relative: 0 %
Eosinophils Absolute: 0.1 10*3/uL (ref 0.0–0.5)
Eosinophils Relative: 2 %
HCT: 38.2 % (ref 36.0–46.0)
Hemoglobin: 12 g/dL (ref 12.0–15.0)
Immature Granulocytes: 0 %
Lymphocytes Relative: 40 %
Lymphs Abs: 1.9 10*3/uL (ref 0.7–4.0)
MCH: 28.4 pg (ref 26.0–34.0)
MCHC: 31.4 g/dL (ref 30.0–36.0)
MCV: 90.5 fL (ref 80.0–100.0)
Monocytes Absolute: 0.5 10*3/uL (ref 0.1–1.0)
Monocytes Relative: 11 %
Neutro Abs: 2.2 10*3/uL (ref 1.7–7.7)
Neutrophils Relative %: 47 %
Platelet Count: 223 10*3/uL (ref 150–400)
RBC: 4.22 MIL/uL (ref 3.87–5.11)
RDW: 15.1 % (ref 11.5–15.5)
WBC Count: 4.8 10*3/uL (ref 4.0–10.5)
nRBC: 0 % (ref 0.0–0.2)

## 2019-08-31 LAB — GENETIC SCREENING ORDER

## 2019-08-31 NOTE — Telephone Encounter (Signed)
Left message on machine returning patient's call 

## 2019-08-31 NOTE — Telephone Encounter (Signed)
Received order for mammaprint on core.  Faxed requisition to pathology and [facility]

## 2019-08-31 NOTE — Telephone Encounter (Signed)
I am happy to send this note, but if she is requesting FMLA for her recently diagnosed breast cancer, her oncologist will need to fill this out as I am not aware what the plan of care is or how long it is recommended she stay out of work.

## 2019-08-31 NOTE — Telephone Encounter (Signed)
Left message on machine for patient to return our call and inform her of Dr Ledell Noss recommendation.
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
## 2019-08-31 NOTE — Telephone Encounter (Signed)
Patient is needing a letter stating that the patient is being seen on Friday and FMLA paperwork will be filled out at this visit.  She needs this letter to get an extension at work.  Please call patient to let her know. It must be done today or she will be terminated.  She didn't know until just now that it was needed today.

## 2019-08-31 NOTE — Progress Notes (Signed)
Radiation Oncology         [phone] ________________________________  Initial outpatient Consultation  Name: Lauren Garrison MRN: [id number]  Date: 08/31/2019  DOB: [birth year]  [id number] Lauren Beals, MD  Lauren Kussmaul, MD   REFERRING PHYSICIAN: [name], MD  DIAGNOSIS:    ICD-10-CM   1. Malignant neoplasm of upper-outer quadrant of left breast in female, estrogen receptor positive ([place])  C50.412    Z17.0    Cancer Staging Malignant neoplasm of upper-outer quadrant of left breast in female, estrogen receptor positive ([place]) Staging form: Breast, AJCC 8th Edition - Clinical stage from 08/31/2019: Stage IIA (cT2, cN1, cM0, G2, ER+, PR+, HER2-) - Unsigned   CHIEF COMPLAINT: Here to discuss management of left breast cancer  HISTORY OF PRESENT ILLNESS::Lauren Garrison is a 59 y.o. female who has a history of left breast cancer treated with lumpectomy and adjuvant radiation in 2005 at [place].  She reports that the radiotherapy caused her skin to turn black but that she recovered well from it.  More recently she presented with a palpable left breast mass in the 2 o'clock position that measured 3.7 cm on ultrasound.  There was also a suspicious lymph node detected on ultrasound.  Biopsy of the tumor and the lymph node both revealed invasive mammary carcinoma, grade 2, ER/PR positive HER-2 negative.  She is otherwise in her usual state of health.  She reports that she had no issue when Dr. Marlou Starks told her that she would need a mastectomy.  She has discussed MammaPrint testing with medical oncology as well.   PREVIOUS RADIATION THERAPY: Yes as above  PAST MEDICAL HISTORY:  has a past medical history of Arthritis of left knee (03/04/2019), Breast cancer ([place]) (2021), Breast cancer, left ([place]), Cancer ([place]) (2005), Closed fracture of left distal femur ([place]) (37/16/9678), Complication of anesthesia, Hypertension, Migraine, Morbid obesity ([place]), Obesity  (03/04/2019), Personal history of radiation therapy, and PONV (postoperative nausea and vomiting).    PAST SURGICAL HISTORY: Past Surgical History:  Procedure Laterality Date  . BREAST LUMPECTOMY    . BREAST SURGERY    . COLONOSCOPY    . ORIF FEMUR FRACTURE Left 03/03/2019   Procedure: OPEN REDUCTION INTERNAL FIXATION (ORIF) DISTAL FEMUR FRACTURE;  Surgeon: Altamese [place], MD;  Location: [facility];  Service: Orthopedics;  Laterality: Left;  . TUBAL LIGATION      FAMILY HISTORY: family history includes Brain cancer in her paternal grandfather; CAD in her father and mother; CVA in her mother; Diabetes in her father and mother; Hypertension in her mother.  SOCIAL HISTORY:  reports that she has never smoked. She has never used smokeless tobacco. She reports previous alcohol use. She reports that she does not use drugs.  ALLERGIES: Lovenox [enoxaparin sodium] and Sulfa antibiotics  MEDICATIONS:  Current Outpatient Medications  Medication Sig Dispense Refill  . acetaminophen (TYLENOL) 500 MG tablet Take 1 tablet (500 mg total) by mouth every 12 (twelve) hours. 60 tablet 0  . amLODipine (NORVASC) 10 MG tablet Take 1 tablet (10 mg total) by mouth daily. 90 tablet 1  . BIOTIN PO Take by mouth daily.    . Turmeric 500 MG CAPS Take 1,000 mg by mouth daily.     No current facility-administered medications for this visit.    REVIEW OF SYSTEMS: As above  PHYSICAL EXAM:  vitals were not taken for this visit.   General: Alert and oriented, in no acute distress  Breasts: Breast exam is deferred today  ECOG = 1  0 - Asymptomatic (Fully active, able to carry on all predisease activities without restriction)  1 - Symptomatic but completely ambulatory (Restricted in physically strenuous activity but ambulatory and able to carry out work of a light or sedentary nature. For example, light housework, office work)  2 - Symptomatic, <50% in bed during the day (Ambulatory and capable of all self care but  unable to carry out any work activities. Up and about more than 50% of waking hours)  3 - Symptomatic, >50% in bed, but not bedbound (Capable of only limited self-care, confined to bed or chair 50% or more of waking hours)  4 - Bedbound (Completely disabled. Cannot carry on any self-care. Totally confined to bed or chair)  5 - Death   Eustace Pen MM, Creech RH, Tormey DC, et al. [phone]). "Toxicity and response criteria of the [facility]". [place] Oncol. 5 (6): 649-55   LABORATORY DATA:  Lab Results  Component Value Date   WBC 4.8 08/31/2019   HGB 12.0 08/31/2019   HCT 38.2 08/31/2019   MCV 90.5 08/31/2019   PLT 223 08/31/2019   CMP     Component Value Date/Time   NA 142 08/31/2019 1212   K 3.5 08/31/2019 1212   CL 106 08/31/2019 1212   CO2 26 08/31/2019 1212   GLUCOSE 109 (H) 08/31/2019 1212   BUN 16 08/31/2019 1212   CREATININE 0.79 08/31/2019 1212   CALCIUM 9.0 08/31/2019 1212   PROT 7.5 08/31/2019 1212   ALBUMIN 3.4 (L) 08/31/2019 1212   AST 19 08/31/2019 1212   ALT 24 08/31/2019 1212   ALKPHOS 110 08/31/2019 1212   BILITOT 0.4 08/31/2019 1212   GFRNONAA >60 08/31/2019 1212   GFRAA >60 08/31/2019 1212         RADIOGRAPHY: US BREAST LTD UNI LEFT INC AXILLA  Result Date: 08/15/2019 CLINICAL DATA:  Palpable lump the left. EXAM: DIGITAL DIAGNOSTIC BILATERAL MAMMOGRAM WITH CAD AND TOMO ULTRASOUND BILATERAL BREAST COMPARISON:  Previous exam(s). ACR Breast Density Category c: The breast tissue is heterogeneously dense, which may obscure small masses. FINDINGS: There are obscured masses in the medial right breast. There is a suspicious obscured mass containing pleomorphic and fine linear calcifications in the upper-outer left breast, just anterior to the patient's previous lumpectomy site. No other suspicious findings are seen in either breast. Mammographic images were processed with CAD. On physical exam, there is a palpable lump in the upper outer left  breast. Targeted ultrasound is performed, showing simple and mildly complicated cysts in the medial right breast accounting for mammographic findings. There is a suspicious irregular hypoechoic mass with internal blood flow in the left breast at 2 o'clock, 3 cm from the nipple measuring 3.7 x 1.7 by 2.7 cm. There is a single mildly abnormal node in the left axilla. IMPRESSION: Highly suspicious mass at 2 o'clock in the left breast. Abnormal lymph node in the left axilla. Fibrocystic changes in the medial right breast. RECOMMENDATION: Recommend ultrasound-guided biopsy of the 2 o'clock left breast mass and the abnormal node in the left axilla. I have discussed the findings and recommendations with the patient. If applicable, a reminder letter will be sent to the patient regarding the next appointment. BI-RADS CATEGORY  5: Highly suggestive of malignancy. Electronically Signed   By: Dorise Bullion III M.D   On: 08/15/2019 14:48   US BREAST LTD UNI RIGHT INC AXILLA  Result Date: 08/15/2019 CLINICAL DATA:  Palpable lump the left.  EXAM: DIGITAL DIAGNOSTIC BILATERAL MAMMOGRAM WITH CAD AND TOMO ULTRASOUND BILATERAL BREAST COMPARISON:  Previous exam(s). ACR Breast Density Category c: The breast tissue is heterogeneously dense, which may obscure small masses. FINDINGS: There are obscured masses in the medial right breast. There is a suspicious obscured mass containing pleomorphic and fine linear calcifications in the upper-outer left breast, just anterior to the patient's previous lumpectomy site. No other suspicious findings are seen in either breast. Mammographic images were processed with CAD. On physical exam, there is a palpable lump in the upper outer left breast. Targeted ultrasound is performed, showing simple and mildly complicated cysts in the medial right breast accounting for mammographic findings. There is a suspicious irregular hypoechoic mass with internal blood flow in the left breast at 2 o'clock, 3 cm  from the nipple measuring 3.7 x 1.7 by 2.7 cm. There is a single mildly abnormal node in the left axilla. IMPRESSION: Highly suspicious mass at 2 o'clock in the left breast. Abnormal lymph node in the left axilla. Fibrocystic changes in the medial right breast. RECOMMENDATION: Recommend ultrasound-guided biopsy of the 2 o'clock left breast mass and the abnormal node in the left axilla. I have discussed the findings and recommendations with the patient. If applicable, a reminder letter will be sent to the patient regarding the next appointment. BI-RADS CATEGORY  5: Highly suggestive of malignancy. Electronically Signed   By: Dorise Bullion III M.D   On: 08/15/2019 14:48   MM DIAG BREAST TOMO BILATERAL  Result Date: 08/15/2019 CLINICAL DATA:  Palpable lump the left. EXAM: DIGITAL DIAGNOSTIC BILATERAL MAMMOGRAM WITH CAD AND TOMO ULTRASOUND BILATERAL BREAST COMPARISON:  Previous exam(s). ACR Breast Density Category c: The breast tissue is heterogeneously dense, which may obscure small masses. FINDINGS: There are obscured masses in the medial right breast. There is a suspicious obscured mass containing pleomorphic and fine linear calcifications in the upper-outer left breast, just anterior to the patient's previous lumpectomy site. No other suspicious findings are seen in either breast. Mammographic images were processed with CAD. On physical exam, there is a palpable lump in the upper outer left breast. Targeted ultrasound is performed, showing simple and mildly complicated cysts in the medial right breast accounting for mammographic findings. There is a suspicious irregular hypoechoic mass with internal blood flow in the left breast at 2 o'clock, 3 cm from the nipple measuring 3.7 x 1.7 by 2.7 cm. There is a single mildly abnormal node in the left axilla. IMPRESSION: Highly suspicious mass at 2 o'clock in the left breast. Abnormal lymph node in the left axilla. Fibrocystic changes in the medial right breast.  RECOMMENDATION: Recommend ultrasound-guided biopsy of the 2 o'clock left breast mass and the abnormal node in the left axilla. I have discussed the findings and recommendations with the patient. If applicable, a reminder letter will be sent to the patient regarding the next appointment. BI-RADS CATEGORY  5: Highly suggestive of malignancy. 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 This was found to be concordant by Dr. Dorise Bullion. Pathology revealed INVASIVE MAMMARY CARCINOMA of the LEFT axillary lymph node. No lymph node tissue is seen. This may represent a lymph node entirely replaced by metastatic tumor. This was found to be concordant by Dr. Dorise Bullion. Pathology results were discussed  with the patient by telephone. The patient reported doing well after the biopsies with tenderness at the sites. Post biopsy instructions and care were reviewed and questions were answered. The patient was encouraged to call [facility] for any additional concerns. The patient was referred to [facility] on August 31, 2019. Pathology results reported by Stacie Acres RN on 08/25/2019. Electronically Signed   By: Dorise Bullion III M.D   On: 08/25/2019 13:40   Result Date: 08/26/2019 CLINICAL DATA:  Biopsy of a left breast mass at 2 o'clock and a left axillary node. EXAM: ULTRASOUND GUIDED LEFT BREAST CORE NEEDLE BIOPSY COMPARISON:  Previous exam(s). PROCEDURE: I met with the patient and we discussed the procedure of ultrasound-guided biopsy, including benefits and alternatives. We discussed the high likelihood of a successful procedure. We discussed the risks of the procedure, including infection,  bleeding, tissue injury, clip migration, and inadequate sampling. Informed written consent was given. The usual time-out protocol was performed immediately prior to the procedure. Lesion quadrant: Left breast 2 o'clock Using sterile technique and 1% Lidocaine as local anesthetic, under direct ultrasound visualization, a 12 gauge spring-loaded device was used to perform biopsy of a 2 o'clock left breast mass using a lateral approach. At the conclusion of the procedure ribbon shaped tissue marker clip was deployed into the biopsy cavity. Follow up 2 view mammogram was performed and dictated separately. Lesion quadrant: Left axilla Using sterile technique and 1% Lidocaine as local anesthetic, under direct ultrasound visualization, a 14 gauge spring-loaded device was used to perform biopsy of an abnormal left axillary node using a lateral approach. At the conclusion of the procedure a Q shaped tissue marker clip was deployed into the biopsy cavity. Follow up 2 view mammogram was performed and dictated separately. IMPRESSION: Ultrasound guided biopsy of a left breast mass at 2 o'clock and an abnormal left axillary node. No apparent complications. Electronically Signed: By: Dorise Bullion III M.D On: 08/24/2019 08:29   MM CLIP PLACEMENT LEFT  Result Date: 08/24/2019 CLINICAL DATA:  Evaluate biopsy marker. EXAM: DIAGNOSTIC LEFT MAMMOGRAM POST ULTRASOUND BIOPSY COMPARISON:  Previous exam(s). FINDINGS: Mammographic images were obtained following ultrasound guided biopsy of a left breast mass and a left axillary node. A ribbon shaped clip is at the anterior aspect of the biopsied mass in the left breast. Biopsied node could not be pulled into view for clip assessment. IMPRESSION: The ribbon shaped biopsy clip is along the anterior aspect of the biopsied mass. The biopsied left axillary node cannot be pulled into view. Final Assessment: Post Procedure Mammograms for Marker Placement Electronically Signed   By: Dorise Bullion III M.D   On: 08/24/2019 08:39   [place] LT BREAST BX W LOC DEV 1ST LESION IMG BX SPEC US GUIDE  Addendum Date: 08/26/2019   ADDENDUM REPORT: 08/25/2019 13:40 ADDENDUM: Pathology revealed GRADE II INVASIVE MAMMARY CARCINOMA of the LEFT breast, 2 o'clock. This was found to be concordant by Dr. Dorise Bullion. Pathology revealed INVASIVE MAMMARY CARCINOMA of the LEFT axillary lymph node. No lymph node tissue is seen. This may represent a lymph node entirely replaced by metastatic tumor. This was found to be concordant by Dr. Dorise Bullion. Pathology results were discussed with the patient by telephone. The patient reported doing well after the biopsies with tenderness at the sites. Post biopsy instructions and care were reviewed and questions were answered. The patient was encouraged to call [facility] for any additional concerns. The patient was referred to [facility] on August 31, 2019. Pathology results reported by Stacie Acres RN on 08/25/2019. Electronically Signed   By: Dorise Bullion III M.D   On: 08/25/2019 13:40   Result Date: 08/26/2019 CLINICAL DATA:  Biopsy of a left breast mass at 2 o'clock and a left axillary node. EXAM: ULTRASOUND GUIDED LEFT BREAST CORE NEEDLE BIOPSY COMPARISON:  Previous exam(s). PROCEDURE: I met with the patient and we discussed the procedure of ultrasound-guided biopsy, including benefits and alternatives. We discussed the high likelihood of a successful procedure. We discussed the risks of the procedure, including infection, bleeding, tissue injury, clip migration, and inadequate sampling. Informed written consent was given. The usual time-out protocol was performed immediately prior to the procedure. Lesion quadrant: Left breast 2 o'clock Using sterile technique and 1% Lidocaine as local anesthetic, under direct ultrasound visualization, a 12 gauge spring-loaded device  was used to perform biopsy of a 2 o'clock left breast mass using a lateral approach. At the conclusion of the procedure ribbon shaped tissue marker clip was deployed into the biopsy cavity. Follow up 2 view mammogram was performed and dictated separately. Lesion quadrant: Left axilla Using sterile technique and 1% Lidocaine as local anesthetic, under direct ultrasound visualization, a 14 gauge spring-loaded device was used to perform biopsy of an abnormal left axillary node using a lateral approach. At the conclusion of the procedure a Q shaped tissue marker clip was deployed into the biopsy cavity. Follow up 2 view mammogram was performed and dictated separately. IMPRESSION: Ultrasound guided biopsy of a left breast mass at 2 o'clock and an abnormal left axillary node. No apparent complications. Electronically Signed: By: Dorise Bullion III M.D On: 08/24/2019 08:29      IMPRESSION/PLAN: This is a very nice 59 year old woman with a history of left breast cancer and radiation in 2005, delivered in [place].  She now presents with left breast cancer again.  She concurs with the team's recommendations for mastectomy.  Dr. Marlou Starks will strategize her axillary surgery with the understanding that radiation therapy is not recommended due to her prior treatment.  She understands MammaPrint testing will determine whether she needs chemotherapy.  I wished her the best and let her know that I am available if she has any questions or concerns in the future. I will see her PRN.  We did discuss breast reconstruction and I encouraged her to consult with plastic surgery to explore if it is a good option for her.  We also discussed alternatives to plastic surgery (breast prostheses) if ultimately she decides to forego breast reconstruction.  We discussed measures to reduce the risk of infection during the COVID-19 pandemic.  I highly recommended that she receive the vaccine at this time.  However, at this time she does not  feel comfortable doing so.  She knows how to schedule vaccine if and when she feels comfortable.  On date of service, in total, I spent 45 minutes on this encounter (this includes review of records, discussion with other providers, face-to-face time with the patient, and documentation afterwards). Patient was seen in person.   __________________________________________   Eppie Gibson, MD

## 2019-08-31 NOTE — Telephone Encounter (Signed)
Letter sent to [facility], copy faxed and confirmed.  Patient is aware.

## 2019-09-01 ENCOUNTER — Telehealth: Payer: Self-pay | Admitting: Oncology

## 2019-09-01 ENCOUNTER — Encounter: Payer: Self-pay | Admitting: Gastroenterology

## 2019-09-01 ENCOUNTER — Encounter: Payer: Self-pay | Admitting: *Deleted

## 2019-09-01 ENCOUNTER — Encounter: Payer: Self-pay | Admitting: General Practice

## 2019-09-01 ENCOUNTER — Ambulatory Visit (AMBULATORY_SURGERY_CENTER): Payer: 59 | Admitting: Gastroenterology

## 2019-09-01 ENCOUNTER — Telehealth: Payer: Self-pay | Admitting: *Deleted

## 2019-09-01 VITALS — BP 118/56 | HR 69 | Temp 97.2°F | Resp 12 | Ht 64.0 in | Wt 236.0 lb

## 2019-09-01 DIAGNOSIS — D122 Benign neoplasm of ascending colon: Secondary | ICD-10-CM

## 2019-09-01 DIAGNOSIS — D126 Benign neoplasm of colon, unspecified: Secondary | ICD-10-CM | POA: Diagnosis not present

## 2019-09-01 DIAGNOSIS — Z1211 Encounter for screening for malignant neoplasm of colon: Secondary | ICD-10-CM | POA: Diagnosis present

## 2019-09-01 DIAGNOSIS — D123 Benign neoplasm of transverse colon: Secondary | ICD-10-CM | POA: Diagnosis not present

## 2019-09-01 MED ORDER — SODIUM CHLORIDE 0.9 % IV SOLN
500.0000 mL | Freq: Once | INTRAVENOUS | Status: DC
Start: 2019-09-01 — End: 2019-09-01

## 2019-09-01 NOTE — Op Note (Signed)
[facility] Patient Name: Lauren Garrison Procedure Date: 09/01/2019 2:46 PM MRN: [id number] Endoscopist: Remo Lipps P. Havery Moros , MD Age: 59 Referring MD:  Date of Birth: [birth year] Gender: Female Account #: [id number] Procedure:                Colonoscopy Indications:              Screening for colorectal malignant neoplasm Medicines:                Monitored Anesthesia Care Procedure:                Pre-Anesthesia Assessment:                           - Prior to the procedure, a History and Physical                            was performed, and patient medications and                            allergies were reviewed. The patient's tolerance of                            previous anesthesia was also reviewed. The risks                            and benefits of the procedure and the sedation                            options and risks were discussed with the patient.                            All questions were answered, and informed consent                            was obtained. Prior Anticoagulants: The patient has                            taken no previous anticoagulant or antiplatelet                            agents. ASA Grade Assessment: III - A patient with                            severe systemic disease. After reviewing the risks                            and benefits, the patient was deemed in                            satisfactory condition to undergo the procedure.                           After obtaining informed consent, the colonoscope  was passed under direct vision. Throughout the                            procedure, the patient's blood pressure, pulse, and                            oxygen saturations were monitored continuously. The                            Colonoscope was introduced through the anus and                            advanced to the the cecum, identified by                            appendiceal  orifice and ileocecal valve. The                            colonoscopy was performed without difficulty. The                            patient tolerated the procedure well. The quality                            of the bowel preparation was adequate. The                            ileocecal valve, appendiceal orifice, and rectum                            were photographed. Scope In: 2:57:34 PM Scope Out: 3:20:11 PM Scope Withdrawal Time: 0 hours 18 minutes 26 seconds  Total Procedure Duration: 0 hours 22 minutes 37 seconds  Findings:                 The perianal and digital rectal examinations were                            normal.                           A 3 mm polyp was found in the ascending colon. The                            polyp was sessile. The polyp was removed with a                            cold biopsy forceps. Resection and retrieval were                            complete.                           A 2 mm polyp was found in the hepatic flexure. The  polyp was sessile. The polyp was removed with a                            cold biopsy forceps. Resection and retrieval were                            complete.                           A few medium-mouthed diverticula were found in the                            sigmoid colon and transverse colon.                           Internal hemorrhoids were found during                            retroflexion. The hemorrhoids were small.                           The exam was otherwise without abnormality. Complications:            No immediate complications. Estimated blood loss:                            Minimal. Estimated Blood Loss:     Estimated blood loss was minimal. Impression:               - One 3 mm polyp in the ascending colon, removed                            with a cold biopsy forceps. Resected and retrieved.                           - One 2 mm polyp at the hepatic flexure,  removed                            with a cold biopsy forceps. Resected and retrieved.                           - Diverticulosis in the sigmoid and transverse                            colon.                           - Internal hemorrhoids.                           - The examination was otherwise normal. Recommendation:           - Patient has a contact number available for                            emergencies. The signs and symptoms of potential  delayed complications were discussed with the                            patient. Return to normal activities tomorrow.                            Written discharge instructions were provided to the                            patient.                           - Resume previous diet.                           - Continue present medications.                           - Await pathology results. Remo Lipps P. Bejamin Hackbart, MD 09/01/2019 3:23:57 PM This report has been signed electronically.

## 2019-09-01 NOTE — Progress Notes (Signed)
Called to room to assist during endoscopic procedure.  Patient ID and intended procedure confirmed with present staff. Received instructions for my participation in the procedure from the performing physician.  

## 2019-09-01 NOTE — Progress Notes (Signed)
A/ox3, pleased with MAC, report to RN 

## 2019-09-01 NOTE — Telephone Encounter (Signed)
Spoke to pt concerning FMLA and letter to HR rep. Encourage pt to reach out with any questions or concerns regarding her dx or treatment care plan as well as needs.

## 2019-09-01 NOTE — Patient Instructions (Signed)
HANDOUTS PROVIDED ON: POLYPS, DIVERTICULOSIS, & HEMORRHOIDS  The polyps removed today have been sent for pathology.  The results can take 1-3 weeks to receive.  When your next colonoscopy should occur will be based on the pathology results.    You may resume your previous diet and medication schedule.  Thank you for allowing us to care for you today!!!   YOU HAD AN ENDOSCOPIC PROCEDURE TODAY AT THE [facility]:   Refer to the procedure report that was given to you for any specific questions about what was found during the examination.  If the procedure report does not answer your questions, please call your gastroenterologist to clarify.  If you requested that your care partner not be given the details of your procedure findings, then the procedure report has been included in a sealed envelope for you to review at your convenience later.  YOU SHOULD EXPECT: Some feelings of bloating in the abdomen. Passage of more gas than usual.  Walking can help get rid of the air that was put into your GI tract during the procedure and reduce the bloating. If you had a lower endoscopy (such as a colonoscopy or flexible sigmoidoscopy) you may notice spotting of blood in your stool or on the toilet paper. If you underwent a bowel prep for your procedure, you may not have a normal bowel movement for a few days.  Please Note:  You might notice some irritation and congestion in your nose or some drainage.  This is from the oxygen used during your procedure.  There is no need for concern and it should clear up in a day or so.  SYMPTOMS TO REPORT IMMEDIATELY:   Following lower endoscopy (colonoscopy or flexible sigmoidoscopy):  Excessive amounts of blood in the stool  Significant tenderness or worsening of abdominal pains  Swelling of the abdomen that is new, acute  Fever of 100F or higher  For urgent or emergent issues, a gastroenterologist can be reached at any hour by calling [phone]. Do  not use MyChart messaging for urgent concerns.    DIET:  We do recommend a small meal at first, but then you may proceed to your regular diet.  Drink plenty of fluids but you should avoid alcoholic beverages for 24 hours.  ACTIVITY:  You should plan to take it easy for the rest of today and you should NOT DRIVE or use heavy machinery until tomorrow (because of the sedation medicines used during the test).    FOLLOW UP: Our staff will call the number listed on your records 48-72 hours following your procedure to check on you and address any questions or concerns that you may have regarding the information given to you following your procedure. If we do not reach you, we will leave a message.  We will attempt to reach you two times.  During this call, we will ask if you have developed any symptoms of COVID 19. If you develop any symptoms (ie: fever, flu-like symptoms, shortness of breath, cough etc.) before then, please call [phone].  If you test positive for Covid 19 in the 2 weeks post procedure, please call and report this information to us.    If any biopsies were taken you will be contacted by phone or by letter within the next 1-3 weeks.  Please call us at [phone] if you have not heard about the biopsies in 3 weeks.    SIGNATURES/CONFIDENTIALITY: You and/or your care partner have signed paperwork which will   be entered into your electronic medical record.  These signatures attest to the fact that that the information above on your After Visit Summary has been reviewed and is understood.  Full responsibility of the confidentiality of this discharge information lies with you and/or your care-partner. 

## 2019-09-01 NOTE — Progress Notes (Signed)
Pt's states no medical or surgical changes since previsit or office visit.  CW vitals, Jb IV.

## 2019-09-01 NOTE — Progress Notes (Signed)
[place] Psychosocial Distress Screening Spiritual Care  Met with Lauren Garrison in [facility] to introduce [facility] team/resources, reviewing distress screen per protocol.  The patient scored a 2 on the Psychosocial Distress Thermometer which indicates mild distress. Also assessed for distress and other psychosocial needs.   ONCBCN DISTRESS SCREENING 09/01/2019  Screening Type Initial Screening  Distress experienced in past week (1-10) 2  Emotional problem type Nervousness/Anxiety  Spiritual/Religous concerns type Facing my mortality  Information Concerns Type Lack of info about complementary therapy choices  Physical Problem type Changes in urination  Referral to support programs Yes   [name]'s strong faith, supportive "friends and prayer warriors," and "this-isn't-my-first-rodeo" attitude help her cope, make meaning, and find encouragement and transferable skills from her first experience with cancer. She reports low distress. Provided empathic listening, normalization of feelings, affirmation of strengths, and introduction to [facility] team/programming resources.  Follow up needed: No. Per [name], no other needs or concerns at this time, but she is aware of ongoing Support Team availability.   [place], [place], [facility] Pager [phone] Voicemail [phone]

## 2019-09-01 NOTE — Telephone Encounter (Signed)
No 7/14 los. No changes made to pt's schedule.  °

## 2019-09-02 ENCOUNTER — Ambulatory Visit (INDEPENDENT_AMBULATORY_CARE_PROVIDER_SITE_OTHER): Payer: 59 | Admitting: Internal Medicine

## 2019-09-02 ENCOUNTER — Encounter: Payer: Self-pay | Admitting: Internal Medicine

## 2019-09-02 ENCOUNTER — Other Ambulatory Visit: Payer: Self-pay

## 2019-09-02 ENCOUNTER — Encounter: Payer: Self-pay | Admitting: Radiation Oncology

## 2019-09-02 VITALS — BP 120/84 | HR 86 | Temp 98.1°F | Wt 236.1 lb

## 2019-09-02 DIAGNOSIS — I10 Essential (primary) hypertension: Secondary | ICD-10-CM

## 2019-09-02 DIAGNOSIS — C50412 Malignant neoplasm of upper-outer quadrant of left female breast: Secondary | ICD-10-CM | POA: Diagnosis not present

## 2019-09-02 DIAGNOSIS — Z170 Estrogen receptor positive status [ER+]: Secondary | ICD-10-CM | POA: Diagnosis not present
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
## 2019-09-05 ENCOUNTER — Telehealth: Payer: Self-pay

## 2019-09-05 NOTE — Telephone Encounter (Signed)
Covid-19 screening questions   Do you now or have you had a fever in the last 14 days? No.  Do you have any respiratory symptoms of shortness of breath or cough now or in the last 14 days? No.  Do you have any family members or close contacts with diagnosed or suspected Covid-19 in the past 14 days? No.  Have you been tested for Covid-19 and found to be positive? No.       Follow up Call-  Call back number 09/01/2019  Post procedure Call Back phone  # [phone]  Permission to leave phone message Yes     Patient questions:  Do you have a fever, pain , or abdominal swelling? No. Pain Score  0 *  Have you tolerated food without any problems? Yes.    Have you been able to return to your normal activities? Yes.    Do you have any questions about your discharge instructions: Diet   No. Medications  No. Follow up visit  No.  Do you have questions or concerns about your Care? No.  Pt. Reports she found out this weekend that one of her first cousins was diagnosed with stage 4 colon cancer.  Told pt. To be sure to let her PCP note this in her family history at her next visit  Actions: * If pain score is 4 or above: No action needed, pain <4.

## 2019-09-05 NOTE — Telephone Encounter (Signed)
That is unfortunate to hear, although she did not have any high risk lesions on her exam and this history, given it is not cancer in a first degree relative, should not influence her surveillance exams moving forward. Will await path results from polypectomies

## 2019-09-07 ENCOUNTER — Institutional Professional Consult (permissible substitution): Payer: 59 | Admitting: Plastic Surgery

## 2019-09-07 ENCOUNTER — Ambulatory Visit (INDEPENDENT_AMBULATORY_CARE_PROVIDER_SITE_OTHER): Payer: 59 | Admitting: Plastic Surgery

## 2019-09-07 ENCOUNTER — Other Ambulatory Visit: Payer: Self-pay

## 2019-09-07 ENCOUNTER — Encounter: Payer: Self-pay | Admitting: Plastic Surgery

## 2019-09-07 VITALS — BP 164/91 | HR 97 | Temp 98.3°F | Ht 64.0 in | Wt 238.4 lb

## 2019-09-07 DIAGNOSIS — Z170 Estrogen receptor positive status [ER+]: Secondary | ICD-10-CM | POA: Diagnosis not present

## 2019-09-07 DIAGNOSIS — C50412 Malignant neoplasm of upper-outer quadrant of left female breast: Secondary | ICD-10-CM | POA: Diagnosis not present
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
## 2019-09-07 NOTE — Progress Notes (Signed)
Nutrition  Patient identified by attending [facility] Clinic on 08/31/19. Patient was given nutrition packet with contact information by nurse navigator.  Chart reviewed.   Patient with recurrent left breast cancer.  Planning mastectomy and mammaprint, no radiation.    Ht: 64 inches Wt: 236 lb BMI: 40  Patient currently is not at nutritional risk.  RD available if change in nutritional status occurs.  Tegh Franek B. Zenia Resides, [place], [place] Registered Dietitian [phone] (mobile)

## 2019-09-13 ENCOUNTER — Encounter: Payer: Self-pay | Admitting: Genetic Counselor

## 2019-09-13 ENCOUNTER — Ambulatory Visit: Payer: Self-pay | Admitting: Genetic Counselor

## 2019-09-13 ENCOUNTER — Telehealth: Payer: Self-pay | Admitting: Genetic Counselor

## 2019-09-13 DIAGNOSIS — C50412 Malignant neoplasm of upper-outer quadrant of left female breast: Secondary | ICD-10-CM

## 2019-09-13 DIAGNOSIS — Z170 Estrogen receptor positive status [ER+]: Secondary | ICD-10-CM

## 2019-09-13 DIAGNOSIS — Z1379 Encounter for other screening for genetic and chromosomal anomalies: Secondary | ICD-10-CM

## 2019-09-13 NOTE — Progress Notes (Addendum)
HPI:  Ms. Wollenberg was previously seen in the [place] clinic due to a personal history of breast cancer and concerns regarding a hereditary predisposition to cancer. Please refer to our prior cancer genetics clinic note for more information regarding our discussion, assessment and recommendations, at the time. Ms. Plantz recent genetic test results were disclosed to her, as were recommendations warranted by these results. These results and recommendations are discussed in more detail below.  CANCER HISTORY:  Oncology History  Malignant neoplasm of upper-outer quadrant of left breast in female, estrogen receptor positive ([place])  08/26/2019 Initial Diagnosis   Malignant neoplasm of upper-outer quadrant of left breast in female, estrogen receptor positive ([place])    Genetic Testing   The pathogenic variants detected on [facility] Breast Cancer STAT panel. The STAT Breast cancer panel offered by [facility] includes sequencing and rearrangement analysis for the following 9 genes:  ATM, BRCA1, BRCA2, CDH1, CHEK2, PALB2, PTEN, STK11 and TP53.  The report date is September 10, 2019.  [facility] Common Hereditary Cancer Panel pending.      FAMILY HISTORY:  We obtained a detailed, 4-generation family history.  Significant diagnoses are listed below: Family History  Problem Relation Age of Onset  . Diabetes Mother   . CAD Mother   . Hypertension Mother   . CVA Mother   . Diabetes Father   . CAD Father   . Brain cancer Paternal Grandfather        dx after 23yo  . Colon cancer Neg Hx   . Esophageal cancer Neg Hx   . Rectal cancer Neg Hx   . Stomach cancer Neg Hx     Ms. Woolbright has three sons and one daughter, ages ranging from 36 to 57.  She has eight grandchildren.  She had two brothers and six sisters.  She reports no cancer history in her brothers or sisters.  Her mother passed away at 40 and her father passed away at 64 without a cancer history.  Her paternal grandfather was diagnosed with brain  cancer after the age of 29 and passed away in his 37s. She did not report other cancer history in her maternal or paternal aunts, uncles, cousins, or grandparents.      Ms. Lehenbauer is unaware of previous family history of genetic testing for hereditary cancer risks. Patient's maternal ancestors and paternal ancestors descent of African American descent. There is no reported Ashkenazi Jewish ancestry. There is no known consanguinity.   GENETIC TEST RESULTS: Genetic testing reported out on September 10, 2019. The STAT Breast Cancer Panel found no pathogenic mutations. The STAT Breast cancer panel offered by [facility] includes sequencing and rearrangement analysis for the following 9 genes:  ATM, BRCA1, BRCA2, CDH1, CHEK2, PALB2, PTEN, STK11 and TP53.  The test report has been scanned into EPIC and is located under the Molecular Pathology section of the Results Review tab.  A portion of the result report is included below for reference.      Genetic testing reported out on September 21, 2019.  The [facility] Common Hereditary Cancers panel found no pathogenic mutations. The Common Hereditary Gene Panel offered by [facility] includes sequencing and/or deletion duplication testing of the following 48 genes: APC, ATM, AXIN2, BARD1, BMPR1A, BRCA1, BRCA2, BRIP1, CDH1, CDK4, CDKN2A (p14ARF), CDKN2A (p16INK4a), CHEK2, CTNNA1, DICER1, EPCAM (Deletion/duplication testing only), GREM1 (promoter region deletion/duplication testing only), KIT, MEN1, MLH1, MSH2, MSH3, MSH6, MUTYH, NBN, NF1, NHTL1, PALB2, PDGFRA, PMS2, POLD1, POLE, PTEN, RAD50, RAD51C, RAD51D, RNF43, SDHB, SDHC,  SDHD, SMAD4, SMARCA4. STK11, TP53, TSC1, TSC2, and VHL.  The following genes were evaluated for sequence changes only: SDHA and HOXB13 c.251G>A variant only. The test report has been scanned into EPIC and is located under the Molecular Pathology section of the Results Review tab.  A portion of the result report is included below for reference.        We  discussed with Ms. Radziewicz that because current genetic testing is not perfect, it is possible there may be a gene mutation in one of these genes that current testing cannot detect, but that chance is small.  We also discussed, that there could be another gene that has not yet been discovered, or that we have not yet tested, that is responsible for the cancer diagnoses in the family. Therefore, it is important to remain in touch with cancer genetics in the future so that we can continue to offer Ms. Renfro the most up to date genetic testing.   ADDITIONAL GENETIC TESTING: We discussed with Ms. Mcquade that there are other genes that are associated with increased cancer risk that can be analyzed. During her initial genetic counseling session, she opted to reflex to the [facility] Common Hereditary Cancers Panel.  These results are pending when she received her initital STAT Breast Cancer panel results. She was contacted by telephone with results when available.  The Common Hereditary Cancers Panel offered by [facility] includes sequencing and/or deletion duplication testing of the following 48 genes: APC, ATM, AXIN2, BARD1, BMPR1A, BRCA1, BRCA2, BRIP1, CDH1, CDK4, CDKN2A (p14ARF), CDKN2A (p16INK4a), CHEK2, CTNNA1, DICER1, EPCAM (Deletion/duplication testing only), GREM1 (promoter region deletion/duplication testing only), KIT, MEN1, MLH1, MSH2, MSH3, MSH6, MUTYH, NBN, NF1, NHTL1, PALB2, PDGFRA, PMS2, POLD1, POLE, PTEN, RAD50, RAD51C, RAD51D, RNF43, SDHB, SDHC, SDHD, SMAD4, SMARCA4. STK11, TP53, TSC1, TSC2, and VHL.  The following genes were evaluated for sequence changes only: SDHA and HOXB13 c.251G>A variant only.  CANCER SCREENING RECOMMENDATIONS: Ms. Matsumura test result is considered negative (normal).  This means that we have not identified a hereditary cause for her personal history of breast cancer at this time. Most cancers happen by chance and this negative test suggests that her cancer may fall into this category.     While reassuring, this does not definitively rule out a hereditary predisposition to cancer. It is still possible that there could be genetic mutations that are undetectable by current technology. There could be genetic mutations in genes that have not been tested or identified to increase cancer risk.  Therefore, it is recommended she continue to follow the cancer management and screening guidelines provided by her oncology and primary healthcare provider.   An individual's cancer risk and medical management are not determined by genetic test results alone. Overall cancer risk assessment incorporates additional factors, including personal medical history, family history, and any available genetic information that may result in a personalized plan for cancer prevention and surveillance  RECOMMENDATIONS FOR FAMILY MEMBERS:  Individuals in this family might be at some increased risk of developing cancer, over the general population risk, simply due to the family history of cancer.  We recommended women in this family have a yearly mammogram beginning at age 90, or 80 years younger than the earliest onset of cancer, an annual clinical breast exam, and perform monthly breast self-exams. Women in this family should also have a gynecological exam as recommended by their primary provider. All family members should be referred for colonoscopy starting at age 65.  FOLLOW-UP: Lastly, we discussed with  Ms. Kimble that cancer genetics is a rapidly advancing field and it is possible that new genetic tests will be appropriate for her and/or her family members in the future. We encouraged her to remain in contact with cancer genetics on an annual basis so we can update her personal and family histories and let her know of advances in cancer genetics that may benefit this family.   Our contact number was provided. Ms. Escalona questions were answered to her satisfaction, and she knows she is welcome to call us at anytime  with additional questions or concerns.     Kataleya Zaugg M. Joette Catching, [email or web address] (P) [phone]

## 2019-09-13 NOTE — Telephone Encounter (Signed)
Revealed negative genetic testing for STAT Breast Cancer panel.  Discussed that we do not know why she has breast cancer or why there is cancer in the family. It could be due to a different gene that we are not testing, or maybe our current technology may not be able to pick something up.  It will be important for her to keep in contact with genetics to keep up with whether additional testing may be needed.  Reminded patient that reflex to 48 gene panel is pending and she will be contacted by phone when results are available.

## 2019-09-15 ENCOUNTER — Other Ambulatory Visit: Payer: Self-pay | Admitting: Oncology

## 2019-09-15 ENCOUNTER — Telehealth: Payer: Self-pay | Admitting: *Deleted

## 2019-09-15 ENCOUNTER — Encounter: Payer: Self-pay | Admitting: *Deleted

## 2019-09-15 NOTE — Telephone Encounter (Signed)
Received Mammaprint result of HIGH RISK on CORE bx. Report given to Dr. Jana Hakim for review. Physician team notified.

## 2019-09-15 NOTE — Progress Notes (Signed)
I called Lauren Garrison and let her know that her MammaPrint came back high risk.  This means that if she wants to [facility] obtain the very good results predicted by MammaPrint she would need chemotherapy.  She tells me she understands chemotherapy prolongs life but that she is against it and she at this point does not want to undergo chemotherapy.  I am making her an appointment with our chemo teaching nurse on August 20 in the morning and I will see her shortly after that if she decides against chemo then she will not need a port placed.

## 2019-09-16 ENCOUNTER — Telehealth: Payer: Self-pay | Admitting: Oncology

## 2019-09-16 NOTE — Telephone Encounter (Signed)
Scheduled appt per 7/29 sch msg - pt is aware of appts added

## 2019-09-21 ENCOUNTER — Telehealth: Payer: Self-pay | Admitting: Genetic Counselor

## 2019-09-21 ENCOUNTER — Encounter: Payer: Self-pay | Admitting: Genetic Counselor
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
## 2019-09-21 NOTE — Telephone Encounter (Signed)
Revealed negative genetic testing on [facility] Common Hereditary Cancers Panel.  Discussed that we do not know why she has breast cancer or why there is cancer in the family. It could be due to a different gene that we are not testing, or maybe our current technology may not be able to pick something up.  It will be important for her to keep in contact with genetics to keep up with whether additional testing may be needed.
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
## 2019-09-21 NOTE — Telephone Encounter (Signed)
Attempted to call Ms. Andrew in order to disclose results of genetic testing.  Left voicemail requesting a call back.

## 2019-09-21 NOTE — Progress Notes (Deleted)
HPI:  [name] was previously seen in the [facility] due to a personal history of breast cancer and concerns regarding a hereditary predisposition to cancer. Please refer to our prior cancer genetics clinic note for more information regarding our discussion, assessment and recommendations, at the time. [name]'s recent genetic test results were disclosed to her, as were recommendations warranted by these results. These results and recommendations are discussed in more detail below.  CANCER HISTORY:  Oncology History  Malignant neoplasm of upper-outer quadrant of left breast in female, estrogen receptor positive (HCC)  08/26/2019 Initial Diagnosis   Malignant neoplasm of upper-outer quadrant of left breast in female, estrogen receptor positive (HCC)   09/12/2019 Genetic Testing   No pathogenic variants detected on [facility] Breast Cancer STAT panel. The STAT Breast cancer panel offered by [facility] includes sequencing and rearrangement analysis for the following 9 genes:  ATM, BRCA1, BRCA2, CDH1, CHEK2, PALB2, PTEN, STK11 and TP53.  The report date is September 10, 2019.    No pathogenic variants detected on [facility] Common Hereditary Cancer Panel. The Common Hereditary Cancer Panel offered by [facility] includes sequencing and/or deletion duplication testing of the following 48 genes: APC, ATM, AXIN2, BARD1, BMPR1A, BRCA1, BRCA2, BRIP1, CDH1, CDK4, CDKN2A (p14ARF), CDKN2A (p16INK4a), CHEK2, CTNNA1, DICER1, EPCAM (Deletion/duplication testing only), GREM1 (promoter region deletion/duplication testing only), KIT, MEN1, MLH1, MSH2, MSH3, MSH6, MUTYH, NBN, NF1, NHTL1, PALB2, PDGFRA, PMS2, POLD1, POLE, PTEN, RAD50, RAD51C, RAD51D, RNF43, SDHB, SDHC, SDHD, SMAD4, SMARCA4. STK11, TP53, TSC1, TSC2, and VHL.  The following genes were evaluated for sequence changes only: SDHA and HOXB13 c.251G>A variant only. The report date is September 21, 2019.      FAMILY HISTORY:  We obtained a detailed,  4-generation family history.  Significant diagnoses are listed below: Family History  Problem Relation Age of Onset  . Diabetes Mother   . CAD Mother   . Hypertension Mother   . CVA Mother   . Diabetes Father   . CAD Father   . Brain cancer Paternal Grandfather        dx after 50yo  . Colon cancer Neg Hx   . Esophageal cancer Neg Hx   . Rectal cancer Neg Hx   . Stomach cancer Neg Hx     [name] has three sons and one daughter, ages ranging from 33 to 43.  She has eight grandchildren.  She had two brothers and six sisters.  She reports no cancer history in her brothers or sisters.  Her mother passed away at 71 and her father passed away at 75 without a cancer history.  Her paternal grandfather was diagnosed with brain cancer after the age of 50 and passed away in his 70s. She did not report other cancer history in her maternal or paternal aunts, uncles, cousins, or grandparents.      [name] is unaware of previous family history of genetic testing for hereditary cancer risks. Patient's maternal ancestors and paternal ancestors descent of African American descent. There is no reported Ashkenazi Jewish ancestry. There is no known consanguinity.   GENETIC TEST RESULTS: Genetic testing reported out on September 10, 2019. The STAT Breast Cancer Panel found no pathogenic mutations. The STAT Breast cancer panel offered by [facility] includes sequencing and rearrangement analysis for the following 9 genes:  ATM, BRCA1, BRCA2, CDH1, CHEK2, PALB2, PTEN, STK11 and TP53.  The test report has been scanned into EPIC and is located under the Molecular Pathology section of the Results   Review tab.  A portion of the result report is included below for reference.      Genetic testing reported out on September 21, 2019.  The [facility] Common Hereditary Cancers panel found no pathogenic mutations. The Common Hereditary Gene Panel offered by [facility] includes sequencing and/or deletion duplication testing of the  following 48 genes: APC, ATM, AXIN2, BARD1, BMPR1A, BRCA1, BRCA2, BRIP1, CDH1, CDK4, CDKN2A (p14ARF), CDKN2A (p16INK4a), CHEK2, CTNNA1, DICER1, EPCAM (Deletion/duplication testing only), GREM1 (promoter region deletion/duplication testing only), KIT, MEN1, MLH1, MSH2, MSH3, MSH6, MUTYH, NBN, NF1, NHTL1, PALB2, PDGFRA, PMS2, POLD1, POLE, PTEN, RAD50, RAD51C, RAD51D, RNF43, SDHB, SDHC, SDHD, SMAD4, SMARCA4. STK11, TP53, TSC1, TSC2, and VHL.  The following genes were evaluated for sequence changes only: SDHA and HOXB13 c.251G>A variant only. The test report has been scanned into EPIC and is located under the Molecular Pathology section of the Results Review tab.  A portion of the result report is included below for reference.        We discussed with [name] that because current genetic testing is not perfect, it is possible there may be a gene mutation in one of these genes that current testing cannot detect, but that chance is small.  We also discussed, that there could be another gene that has not yet been discovered, or that we have not yet tested, that is responsible for the cancer diagnoses in the family. Therefore, it is important to remain in touch with cancer genetics in the future so that we can continue to offer [name] the most up to date genetic testing.   ADDITIONAL GENETIC TESTING: We discussed with [name] that there are other genes that are associated with increased cancer risk that can be analyzed. During her initial genetic counseling session, she opted to reflex to the [facility] Common Hereditary Cancers Panel.  These results are pending, and she will be contacted by telephone with results when available.  The Common Hereditary Cancers Panel offered by [facility] includes sequencing and/or deletion duplication testing of the following 48 genes: APC, ATM, AXIN2, BARD1, BMPR1A, BRCA1, BRCA2, BRIP1, CDH1, CDK4, CDKN2A (p14ARF), CDKN2A (p16INK4a), CHEK2, CTNNA1, DICER1, EPCAM  (Deletion/duplication testing only), GREM1 (promoter region deletion/duplication testing only), KIT, MEN1, MLH1, MSH2, MSH3, MSH6, MUTYH, NBN, NF1, NHTL1, PALB2, PDGFRA, PMS2, POLD1, POLE, PTEN, RAD50, RAD51C, RAD51D, RNF43, SDHB, SDHC, SDHD, SMAD4, SMARCA4. STK11, TP53, TSC1, TSC2, and VHL.  The following genes were evaluated for sequence changes only: SDHA and HOXB13 c.251G>A variant only.  CANCER SCREENING RECOMMENDATIONS: [name] test result is considered negative (normal).  This means that we have not identified a hereditary cause for her personal history of breast cancer at this time. Most cancers happen by chance and this negative test suggests that her cancer may fall into this category.    While reassuring, this does not definitively rule out a hereditary predisposition to cancer. It is still possible that there could be genetic mutations that are undetectable by current technology. There could be genetic mutations in genes that have not been tested or identified to increase cancer risk.  Therefore, it is recommended she continue to follow the cancer management and screening guidelines provided by her oncology and primary healthcare provider.   An individual's cancer risk and medical management are not determined by genetic test results alone. Overall cancer risk assessment incorporates additional factors, including personal medical history, family history, and any available genetic information that may result in a personalized plan for cancer prevention and surveillance  RECOMMENDATIONS FOR  FAMILY MEMBERS:  Individuals in this family might be at some increased risk of developing cancer, over the general population risk, simply due to the family history of cancer.  We recommended women in this family have a yearly mammogram beginning at age 74, or 67 years younger than the earliest onset of cancer, an annual clinical breast exam, and perform monthly breast self-exams. Women in this family should  also have a gynecological exam as recommended by their primary provider. All family members should be referred for colonoscopy starting at age 39.  FOLLOW-UP: Lastly, we discussed with [name] that cancer genetics is a rapidly advancing field and it is possible that new genetic tests will be appropriate for her and/or her family members in the future. We encouraged her to remain in contact with cancer genetics on an annual basis so we can update her personal and family histories and let her know of advances in cancer genetics that may benefit this family.   Our contact number was provided. [name] questions were answered to her satisfaction, and she knows she is welcome to call us at anytime with additional questions or concerns.     Keimon Basaldua M. Joette Catching, [name] (P) [phone]

## 2019-09-23 ENCOUNTER — Ambulatory Visit (INDEPENDENT_AMBULATORY_CARE_PROVIDER_SITE_OTHER): Payer: 59 | Admitting: Plastic Surgery

## 2019-09-23 ENCOUNTER — Other Ambulatory Visit: Payer: Self-pay

## 2019-09-23 ENCOUNTER — Encounter: Payer: Self-pay | Admitting: Plastic Surgery

## 2019-09-23 ENCOUNTER — Encounter: Payer: Self-pay | Admitting: Oncology

## 2019-09-23 VITALS — BP 123/79 | HR 109 | Temp 99.3°F | Ht 64.0 in | Wt 238.0 lb

## 2019-09-23 DIAGNOSIS — Z170 Estrogen receptor positive status [ER+]: Secondary | ICD-10-CM

## 2019-09-23 DIAGNOSIS — C50412 Malignant neoplasm of upper-outer quadrant of left female breast: Secondary | ICD-10-CM

## 2019-09-23 MED ORDER — ONDANSETRON HCL 4 MG PO TABS: 4.0000 mg | ORAL_TABLET | Freq: Three times a day (TID) | ORAL | 0 refills | Status: DC | PRN

## 2019-09-23 MED ORDER — HYDROCODONE-ACETAMINOPHEN 5-325 MG PO TABS: 1.0000 | ORAL_TABLET | Freq: Three times a day (TID) | ORAL | 0 refills | Status: AC | PRN

## 2019-09-23 MED ORDER — ACETAMINOPHEN 500 MG PO TABS
500.0000 mg | ORAL_TABLET | Freq: Four times a day (QID) | ORAL | 0 refills | Status: DC | PRN
Start: 2019-09-23 — End: 2019-11-14

## 2019-09-23 MED ORDER — CEPHALEXIN 500 MG PO CAPS
500.0000 mg | ORAL_CAPSULE | Freq: Four times a day (QID) | ORAL | 0 refills | Status: AC
Start: 2019-09-23 — End: 2019-09-26

## 2019-09-23 MED ORDER — DIAZEPAM 2 MG PO TABS
2.0000 mg | ORAL_TABLET | Freq: Two times a day (BID) | ORAL | 0 refills | Status: DC | PRN
Start: 2019-09-23 — End: 2019-11-14

## 2019-09-23 MED ORDER — IBUPROFEN 600 MG PO TABS
600.0000 mg | ORAL_TABLET | Freq: Four times a day (QID) | ORAL | 0 refills | Status: DC | PRN
Start: 2019-09-23 — End: 2019-11-14

## 2019-09-28 ENCOUNTER — Other Ambulatory Visit: Payer: Self-pay | Admitting: Oncology

## 2019-10-06 ENCOUNTER — Other Ambulatory Visit: Payer: Self-pay | Admitting: Oncology
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
## 2019-10-07 ENCOUNTER — Other Ambulatory Visit: Payer: Self-pay

## 2019-10-07 ENCOUNTER — Inpatient Hospital Stay: Payer: 59

## 2019-10-07 ENCOUNTER — Inpatient Hospital Stay: Payer: 59 | Attending: Oncology | Admitting: Oncology

## 2019-10-07 VITALS — BP 142/77 | HR 82 | Temp 96.5°F | Resp 20 | Ht 64.0 in | Wt 239.5 lb

## 2019-10-07 DIAGNOSIS — C50412 Malignant neoplasm of upper-outer quadrant of left female breast: Secondary | ICD-10-CM

## 2019-10-07 DIAGNOSIS — Z170 Estrogen receptor positive status [ER+]: Secondary | ICD-10-CM

## 2019-10-07 DIAGNOSIS — I158 Other secondary hypertension: Secondary | ICD-10-CM

## 2019-10-07 DIAGNOSIS — E559 Vitamin D deficiency, unspecified: Secondary | ICD-10-CM | POA: Diagnosis not present

## 2019-10-10 ENCOUNTER — Encounter: Payer: Self-pay | Admitting: *Deleted

## 2019-10-10 ENCOUNTER — Telehealth: Payer: Self-pay | Admitting: Oncology

## 2019-10-10 NOTE — Telephone Encounter (Signed)
Scheduled appts per 8/20 los. Pt confirmed appt date and time.  °

## 2019-10-14 ENCOUNTER — Other Ambulatory Visit: Payer: Self-pay

## 2019-10-14 ENCOUNTER — Encounter (HOSPITAL_BASED_OUTPATIENT_CLINIC_OR_DEPARTMENT_OTHER): Payer: Self-pay | Admitting: General Surgery

## 2019-10-14 NOTE — Progress Notes (Signed)
°   10/14/19 1524  OBSTRUCTIVE SLEEP APNEA  Have you ever been diagnosed with sleep apnea through a sleep study? No  Do you snore loudly (loud enough to be heard through closed doors)?  1  Do you often feel tired, fatigued, or sleepy during the daytime (such as falling asleep during driving or talking to someone)? 0  Has anyone observed you stop breathing during your sleep? 1  Do you have, or are you being treated for high blood pressure? 1  BMI more than 35 kg/m2? 1  Age > 50 (1-yes) 1  Neck circumference greater than:Female 16 inches or larger, Female 17inches or larger? 0  Female Gender (Yes=1) 0  Obstructive Sleep Apnea Score 5  Score 5 or greater  Results sent to PCP

## 2019-10-15 ENCOUNTER — Other Ambulatory Visit (HOSPITAL_COMMUNITY)
Admission: RE | Admit: 2019-10-15 | Discharge: 2019-10-15 | Disposition: A | Discharge status: Home / Self Care | Payer: 59 | Source: Ambulatory Visit | Attending: General Surgery | Admitting: General Surgery

## 2019-10-15 DIAGNOSIS — Z20822 Contact with and (suspected) exposure to covid-19: Secondary | ICD-10-CM | POA: Diagnosis not present

## 2019-10-15 DIAGNOSIS — Z01812 Encounter for preprocedural laboratory examination: Secondary | ICD-10-CM | POA: Insufficient documentation

## 2019-10-15 LAB — SARS CORONAVIRUS 2 (TAT 6-24 HRS): SARS Coronavirus 2: NEGATIVE

## 2019-10-18 NOTE — Progress Notes (Signed)
° ° ° ° °  Enhanced Recovery after Surgery for Orthopedics Enhanced Recovery after Surgery is a protocol used to improve the stress on your body and your recovery after surgery.  Patient Instructions   The night before surgery:  o No food after midnight. ONLY clear liquids after midnight   The day of surgery (if you do NOT have diabetes):  o Drink ONE (1) Pre-Surgery Clear Ensure as directed.   o This drink was given to you during your hospital  pre-op appointment visit. o The pre-op nurse will instruct you on the time to drink the  Pre-Surgery Ensure depending on your surgery time. o Finish the drink at the designated time by the pre-op nurse.  o Nothing else to drink after completing the  Pre-Surgery Clear Ensure.   The day of surgery (if you have diabetes): o Drink ONE (1) Gatorade 2 (G2) as directed. o This drink was given to you during your hospital  pre-op appointment visit.  o The pre-op nurse will instruct you on the time to drink the   Gatorade 2 (G2) depending on your surgery time. o Color of the Gatorade may vary. Red is not allowed. o Nothing else to drink after completing the  Gatorade 2 (G2).         If you have questions, please contact your surgeons office.  Pt given surgical soap with instructions, pt verbalized understandings.

## 2019-10-19 ENCOUNTER — Observation Stay (HOSPITAL_BASED_OUTPATIENT_CLINIC_OR_DEPARTMENT_OTHER)
Admission: RE | Admit: 2019-10-19 | Discharge: 2019-10-20 | Disposition: A | Discharge status: Home / Self Care | Payer: 59 | Source: Ambulatory Visit | Attending: Plastic Surgery | Admitting: Plastic Surgery

## 2019-10-19 ENCOUNTER — Encounter (HOSPITAL_BASED_OUTPATIENT_CLINIC_OR_DEPARTMENT_OTHER): Payer: Self-pay | Admitting: General Surgery

## 2019-10-19 ENCOUNTER — Ambulatory Visit (HOSPITAL_BASED_OUTPATIENT_CLINIC_OR_DEPARTMENT_OTHER): Payer: 59 | Admitting: Anesthesiology

## 2019-10-19 ENCOUNTER — Encounter (HOSPITAL_BASED_OUTPATIENT_CLINIC_OR_DEPARTMENT_OTHER)
Admission: RE | Disposition: A | Discharge status: Home / Self Care | Payer: Self-pay | Source: Ambulatory Visit | Attending: Plastic Surgery

## 2019-10-19 ENCOUNTER — Other Ambulatory Visit: Payer: Self-pay

## 2019-10-19 DIAGNOSIS — G43909 Migraine, unspecified, not intractable, without status migrainosus: Secondary | ICD-10-CM | POA: Diagnosis not present

## 2019-10-19 DIAGNOSIS — C50412 Malignant neoplasm of upper-outer quadrant of left female breast: Principal | ICD-10-CM | POA: Insufficient documentation

## 2019-10-19 DIAGNOSIS — Z170 Estrogen receptor positive status [ER+]: Secondary | ICD-10-CM | POA: Diagnosis not present

## 2019-10-19 DIAGNOSIS — Z6841 Body Mass Index (BMI) 40.0 and over, adult: Secondary | ICD-10-CM | POA: Insufficient documentation

## 2019-10-19 DIAGNOSIS — I10 Essential (primary) hypertension: Secondary | ICD-10-CM | POA: Insufficient documentation

## 2019-10-19 DIAGNOSIS — C50919 Malignant neoplasm of unspecified site of unspecified female breast: Secondary | ICD-10-CM | POA: Diagnosis present

## 2019-10-19 DIAGNOSIS — Z79899 Other long term (current) drug therapy: Secondary | ICD-10-CM | POA: Diagnosis not present

## 2019-10-19 HISTORY — PX: BREAST RECONSTRUCTION WITH PLACEMENT OF TISSUE EXPANDER AND FLEX HD (ACELLULAR HYDRATED DERMIS): SHX6295

## 2019-10-19 HISTORY — PX: MASTECTOMY MODIFIED RADICAL: SHX5962

## 2019-10-19 HISTORY — PX: MASTECTOMY: SHX3

## 2019-10-19 SURGERY — MASTECTOMY, MODIFIED RADICAL
Anesthesia: General | Site: Breast | Laterality: Left

## 2019-10-19 MED ORDER — PROMETHAZINE HCL 25 MG/ML IJ SOLN: 6.2500 mg | INTRAMUSCULAR | Status: DC | PRN

## 2019-10-19 MED ORDER — ACETAMINOPHEN 500 MG PO TABS
ORAL_TABLET | ORAL | Status: AC
  Filled 2019-10-19: qty 2

## 2019-10-19 MED ORDER — CHLORHEXIDINE GLUCONATE CLOTH 2 % EX PADS: 6.0000 | MEDICATED_PAD | Freq: Once | CUTANEOUS | Status: DC

## 2019-10-19 MED ORDER — LIDOCAINE 2% (20 MG/ML) 5 ML SYRINGE
INTRAMUSCULAR | Status: AC
  Filled 2019-10-19: qty 5

## 2019-10-19 MED ORDER — PROPOFOL 500 MG/50ML IV EMUL
INTRAVENOUS | Status: DC | PRN
  Administered 2019-10-19: 25 ug/kg/min via INTRAVENOUS

## 2019-10-19 MED ORDER — ONDANSETRON 4 MG PO TBDP: 4.0000 mg | ORAL_TABLET | Freq: Four times a day (QID) | ORAL | Status: DC | PRN

## 2019-10-19 MED ORDER — ZOLPIDEM TARTRATE 5 MG PO TABS: 5.0000 mg | ORAL_TABLET | Freq: Every evening | ORAL | Status: DC | PRN

## 2019-10-19 MED ORDER — MEPERIDINE HCL 25 MG/ML IJ SOLN: 6.2500 mg | INTRAMUSCULAR | Status: DC | PRN

## 2019-10-19 MED ORDER — ONDANSETRON HCL 4 MG/2ML IJ SOLN: 4.0000 mg | Freq: Four times a day (QID) | INTRAMUSCULAR | Status: DC | PRN

## 2019-10-19 MED ORDER — CEFAZOLIN SODIUM-DEXTROSE 2-4 GM/100ML-% IV SOLN
INTRAVENOUS | Status: AC
  Filled 2019-10-19: qty 100

## 2019-10-19 MED ORDER — LIDOCAINE HCL (CARDIAC) PF 100 MG/5ML IV SOSY
PREFILLED_SYRINGE | INTRAVENOUS | Status: DC | PRN
  Administered 2019-10-19: 40 mg via INTRATRACHEAL

## 2019-10-19 MED ORDER — PROPOFOL 10 MG/ML IV BOLUS
INTRAVENOUS | Status: DC | PRN
  Administered 2019-10-19: 200 mg via INTRAVENOUS

## 2019-10-19 MED ORDER — SENNA 8.6 MG PO TABS: 1.0000 | ORAL_TABLET | Freq: Two times a day (BID) | ORAL | Status: DC

## 2019-10-19 MED ORDER — HYDROMORPHONE HCL 1 MG/ML IJ SOLN
INTRAMUSCULAR | Status: AC
  Filled 2019-10-19: qty 0.5

## 2019-10-19 MED ORDER — SUGAMMADEX SODIUM 200 MG/2ML IV SOLN
INTRAVENOUS | Status: DC | PRN
  Administered 2019-10-19: 200 mg via INTRAVENOUS

## 2019-10-19 MED ORDER — FENTANYL CITRATE (PF) 100 MCG/2ML IJ SOLN
INTRAMUSCULAR | Status: AC
  Filled 2019-10-19: qty 2

## 2019-10-19 MED ORDER — ROCURONIUM BROMIDE 100 MG/10ML IV SOLN
INTRAVENOUS | Status: DC | PRN
  Administered 2019-10-19: 60 mg via INTRAVENOUS

## 2019-10-19 MED ORDER — FENTANYL CITRATE (PF) 100 MCG/2ML IJ SOLN
100.0000 ug | Freq: Once | INTRAMUSCULAR | Status: AC
  Administered 2019-10-19: 100 ug via INTRAVENOUS

## 2019-10-19 MED ORDER — FENTANYL CITRATE (PF) 100 MCG/2ML IJ SOLN
INTRAMUSCULAR | Status: DC | PRN
Start: 2019-10-19 — End: 2019-10-19
  Administered 2019-10-19 (×4): 50 ug via INTRAVENOUS

## 2019-10-19 MED ORDER — MIDAZOLAM HCL 2 MG/2ML IJ SOLN: 0.5000 mg | Freq: Once | INTRAMUSCULAR | Status: DC | PRN

## 2019-10-19 MED ORDER — GABAPENTIN 300 MG PO CAPS
300.0000 mg | ORAL_CAPSULE | ORAL | Status: AC
  Administered 2019-10-19: 300 mg via ORAL

## 2019-10-19 MED ORDER — ACETAMINOPHEN 500 MG PO TABS: 1000.0000 mg | ORAL_TABLET | Freq: Once | ORAL | Status: DC

## 2019-10-19 MED ORDER — ROCURONIUM BROMIDE 10 MG/ML (PF) SYRINGE
PREFILLED_SYRINGE | INTRAVENOUS | Status: AC
  Filled 2019-10-19: qty 10

## 2019-10-19 MED ORDER — MIDAZOLAM HCL 2 MG/2ML IJ SOLN
2.0000 mg | Freq: Once | INTRAMUSCULAR | Status: AC
  Administered 2019-10-19: 2 mg via INTRAVENOUS

## 2019-10-19 MED ORDER — ACETAMINOPHEN 325 MG PO TABS: 325.0000 mg | ORAL_TABLET | Freq: Four times a day (QID) | ORAL | Status: DC

## 2019-10-19 MED ORDER — MIDAZOLAM HCL 2 MG/2ML IJ SOLN
INTRAMUSCULAR | Status: AC
  Filled 2019-10-19: qty 2

## 2019-10-19 MED ORDER — ONDANSETRON HCL 4 MG/2ML IJ SOLN
INTRAMUSCULAR | Status: AC
  Filled 2019-10-19: qty 2

## 2019-10-19 MED ORDER — DIPHENHYDRAMINE HCL 50 MG/ML IJ SOLN: 12.5000 mg | Freq: Four times a day (QID) | INTRAMUSCULAR | Status: DC | PRN

## 2019-10-19 MED ORDER — HYDROCODONE-ACETAMINOPHEN 5-325 MG PO TABS
1.0000 | ORAL_TABLET | ORAL | Status: DC | PRN
  Administered 2019-10-19 – 2019-10-20 (×2): 1 via ORAL
  Filled 2019-10-19 (×2): qty 1

## 2019-10-19 MED ORDER — ONDANSETRON HCL 4 MG/2ML IJ SOLN
INTRAMUSCULAR | Status: DC | PRN
  Administered 2019-10-19: 4 mg via INTRAVENOUS

## 2019-10-19 MED ORDER — GABAPENTIN 300 MG PO CAPS
ORAL_CAPSULE | ORAL | Status: AC
  Filled 2019-10-19: qty 1

## 2019-10-19 MED ORDER — DEXAMETHASONE SODIUM PHOSPHATE 10 MG/ML IJ SOLN
INTRAMUSCULAR | Status: DC | PRN
  Administered 2019-10-19: 10 mg via INTRAVENOUS

## 2019-10-19 MED ORDER — POLYETHYLENE GLYCOL 3350 17 G PO PACK: 17.0000 g | PACK | Freq: Every day | ORAL | Status: DC | PRN

## 2019-10-19 MED ORDER — SCOPOLAMINE 1 MG/3DAYS TD PT72
1.0000 | MEDICATED_PATCH | TRANSDERMAL | Status: DC
  Administered 2019-10-19: 1.5 mg via TRANSDERMAL

## 2019-10-19 MED ORDER — HYDROMORPHONE HCL 1 MG/ML IJ SOLN
0.2500 mg | INTRAMUSCULAR | Status: DC | PRN
  Administered 2019-10-19: 0.5 mg via INTRAVENOUS

## 2019-10-19 MED ORDER — ACETAMINOPHEN 500 MG PO TABS
1000.0000 mg | ORAL_TABLET | ORAL | Status: AC
  Administered 2019-10-19: 1000 mg via ORAL

## 2019-10-19 MED ORDER — CEFAZOLIN SODIUM-DEXTROSE 2-4 GM/100ML-% IV SOLN: 2.0000 g | INTRAVENOUS | Status: AC

## 2019-10-19 MED ORDER — DEXAMETHASONE SODIUM PHOSPHATE 10 MG/ML IJ SOLN
INTRAMUSCULAR | Status: AC
  Filled 2019-10-19: qty 1

## 2019-10-19 MED ORDER — DEXTROSE 5 % IV SOLN: 0.5000 g | Freq: Three times a day (TID) | INTRAVENOUS | Status: DC

## 2019-10-19 MED ORDER — KCL IN DEXTROSE-NACL 20-5-0.45 MEQ/L-%-% IV SOLN
INTRAVENOUS | Status: DC
  Filled 2019-10-19: qty 1000

## 2019-10-19 MED ORDER — SCOPOLAMINE 1 MG/3DAYS TD PT72
MEDICATED_PATCH | TRANSDERMAL | Status: AC
  Filled 2019-10-19: qty 1

## 2019-10-19 MED ORDER — LACTATED RINGERS IV SOLN: INTRAVENOUS | Status: DC

## 2019-10-19 MED ORDER — HYDROMORPHONE HCL 1 MG/ML IJ SOLN: 1.0000 mg | INTRAMUSCULAR | Status: DC | PRN

## 2019-10-19 MED ORDER — BUPIVACAINE-EPINEPHRINE (PF) 0.5% -1:200000 IJ SOLN
INTRAMUSCULAR | Status: DC | PRN
  Administered 2019-10-19: 30 mL

## 2019-10-19 MED ORDER — CEFAZOLIN SODIUM-DEXTROSE 1-4 GM/50ML-% IV SOLN
1.0000 g | Freq: Three times a day (TID) | INTRAVENOUS | Status: DC
  Administered 2019-10-19 – 2019-10-20 (×2): 1 g via INTRAVENOUS
  Filled 2019-10-19 (×2): qty 50

## 2019-10-19 MED ORDER — DIPHENHYDRAMINE HCL 12.5 MG/5ML PO ELIX: 12.5000 mg | ORAL_SOLUTION | Freq: Four times a day (QID) | ORAL | Status: DC | PRN

## 2019-10-19 MED ORDER — SODIUM CHLORIDE 0.9 % IV SOLN
INTRAVENOUS | Status: DC | PRN
  Administered 2019-10-19: 500 mL

## 2019-10-19 MED ORDER — PROPOFOL 500 MG/50ML IV EMUL
INTRAVENOUS | Status: AC
  Filled 2019-10-19: qty 50

## 2019-10-19 MED ORDER — OXYCODONE HCL 5 MG/5ML PO SOLN: 5.0000 mg | Freq: Once | ORAL | Status: DC | PRN

## 2019-10-19 MED ORDER — DIAZEPAM 2 MG PO TABS: 2.0000 mg | ORAL_TABLET | Freq: Two times a day (BID) | ORAL | Status: DC | PRN

## 2019-10-19 MED ORDER — PROPOFOL 10 MG/ML IV BOLUS
INTRAVENOUS | Status: AC
  Filled 2019-10-19: qty 20

## 2019-10-19 MED ORDER — IBUPROFEN 200 MG PO TABS
400.0000 mg | ORAL_TABLET | Freq: Four times a day (QID) | ORAL | Status: DC
  Administered 2019-10-19: 400 mg via ORAL
  Filled 2019-10-19: qty 2

## 2019-10-19 MED ORDER — CEFAZOLIN SODIUM-DEXTROSE 2-4 GM/100ML-% IV SOLN
2.0000 g | INTRAVENOUS | Status: AC
  Administered 2019-10-19: 2 g via INTRAVENOUS

## 2019-10-19 MED ORDER — OXYCODONE HCL 5 MG PO TABS: 5.0000 mg | ORAL_TABLET | Freq: Once | ORAL | Status: DC | PRN

## 2019-10-19 SURGICAL SUPPLY — 83 items
APPLIER CLIP 9.375 MED OPEN (MISCELLANEOUS) ×4
BAG DECANTER FOR FLEXI CONT (MISCELLANEOUS) ×2 IMPLANT
BINDER BREAST 3XL (GAUZE/BANDAGES/DRESSINGS) IMPLANT
BINDER BREAST LRG (GAUZE/BANDAGES/DRESSINGS) IMPLANT
BINDER BREAST MEDIUM (GAUZE/BANDAGES/DRESSINGS) IMPLANT
BINDER BREAST XLRG (GAUZE/BANDAGES/DRESSINGS) IMPLANT
BINDER BREAST XXLRG (GAUZE/BANDAGES/DRESSINGS) ×1 IMPLANT
BIOPATCH RED 1 DISK 7.0 (GAUZE/BANDAGES/DRESSINGS) ×2 IMPLANT
BLADE CLIPPER SURG (BLADE) IMPLANT
BLADE HEX COATED 2.75 (ELECTRODE) ×2 IMPLANT
BLADE SURG 10 STRL SS (BLADE) ×3 IMPLANT
BLADE SURG 15 STRL LF DISP TIS (BLADE) ×1 IMPLANT
BLADE SURG 15 STRL SS (BLADE) ×1
BNDG GAUZE ELAST 4 BULKY (GAUZE/BANDAGES/DRESSINGS) ×2 IMPLANT
CANISTER SUCT 1200ML W/VALVE (MISCELLANEOUS) ×2 IMPLANT
CHLORAPREP W/TINT 26 (MISCELLANEOUS) ×3 IMPLANT
CLIP APPLIE 9.375 MED OPEN (MISCELLANEOUS) ×1 IMPLANT
COVER BACK TABLE 60X90IN (DRAPES) ×2 IMPLANT
COVER MAYO STAND STRL (DRAPES) ×2 IMPLANT
COVER WAND RF STERILE (DRAPES) IMPLANT
DECANTER SPIKE VIAL GLASS SM (MISCELLANEOUS) IMPLANT
DERMABOND ADVANCED (GAUZE/BANDAGES/DRESSINGS) ×1
DERMABOND ADVANCED .7 DNX12 (GAUZE/BANDAGES/DRESSINGS) ×1 IMPLANT
DRAIN CHANNEL 19F RND (DRAIN) ×2 IMPLANT
DRAPE LAPAROSCOPIC ABDOMINAL (DRAPES) ×2 IMPLANT
DRAPE SURG 17X23 STRL (DRAPES) IMPLANT
DRAPE UTILITY XL STRL (DRAPES) ×2 IMPLANT
DRSG OPSITE POSTOP 4X6 (GAUZE/BANDAGES/DRESSINGS) IMPLANT
DRSG OPSITE POSTOP 4X8 (GAUZE/BANDAGES/DRESSINGS) ×1 IMPLANT
DRSG PAD ABDOMINAL 8X10 ST (GAUZE/BANDAGES/DRESSINGS) ×4 IMPLANT
ELECT BLADE 4.0 EZ CLEAN MEGAD (MISCELLANEOUS) ×2
ELECT COATED BLADE 2.86 ST (ELECTRODE) ×2 IMPLANT
ELECT REM PT RETURN 9FT ADLT (ELECTROSURGICAL) ×2
ELECTRODE BLDE 4.0 EZ CLN MEGD (MISCELLANEOUS) ×1 IMPLANT
ELECTRODE REM PT RTRN 9FT ADLT (ELECTROSURGICAL) ×1 IMPLANT
EVACUATOR SILICONE 100CC (DRAIN) ×2 IMPLANT
FUNNEL KELLER 2 DISP (MISCELLANEOUS) IMPLANT
GAUZE SPONGE 4X4 12PLY STRL LF (GAUZE/BANDAGES/DRESSINGS) IMPLANT
GLOVE BIO SURGEON STRL SZ 6.5 (GLOVE) ×6 IMPLANT
GLOVE BIO SURGEON STRL SZ7.5 (GLOVE) ×3 IMPLANT
GOWN STRL REUS W/ TWL LRG LVL3 (GOWN DISPOSABLE) ×3 IMPLANT
GOWN STRL REUS W/TWL LRG LVL3 (GOWN DISPOSABLE) ×4
GRAFT FLEX HD 6X16 PLIABLE (Tissue) ×1 IMPLANT
ILLUMINATOR WAVEGUIDE N/F (MISCELLANEOUS) IMPLANT
IMPL EXPANDER BREAST 535CC (Breast) IMPLANT
IMPLANT BREAST 535CC (Breast) ×1 IMPLANT
IMPLANT EXPANDER BREAST 535CC (Breast) ×1 IMPLANT
IV NS 1000ML (IV SOLUTION)
IV NS 1000ML BAXH (IV SOLUTION) IMPLANT
IV NS 500ML (IV SOLUTION) ×1
IV NS 500ML BAXH (IV SOLUTION) ×1 IMPLANT
KIT FILL SYSTEM UNIVERSAL (SET/KITS/TRAYS/PACK) ×1 IMPLANT
LIGHT WAVEGUIDE WIDE FLAT (MISCELLANEOUS) IMPLANT
NDL HYPO 25X1 1.5 SAFETY (NEEDLE) IMPLANT
NEEDLE HYPO 25X1 1.5 SAFETY (NEEDLE) IMPLANT
NS IRRIG 1000ML POUR BTL (IV SOLUTION) ×2 IMPLANT
PACK BASIN DAY SURGERY FS (CUSTOM PROCEDURE TRAY) ×2 IMPLANT
PAD FOAM SILICONE BACKED (GAUZE/BANDAGES/DRESSINGS) IMPLANT
PENCIL SMOKE EVACUATOR (MISCELLANEOUS) ×2 IMPLANT
PIN SAFETY STERILE (MISCELLANEOUS) ×2 IMPLANT
SLEEVE SCD COMPRESS KNEE MED (MISCELLANEOUS) ×2 IMPLANT
SPONGE LAP 18X18 RF (DISPOSABLE) ×5 IMPLANT
STRIP SUTURE WOUND CLOSURE 1/2 (MISCELLANEOUS) IMPLANT
SUT ETHILON 2 0 FS 18 (SUTURE) ×2 IMPLANT
SUT MNCRL AB 4-0 PS2 18 (SUTURE) ×3 IMPLANT
SUT MON AB 3-0 SH 27 (SUTURE) ×1
SUT MON AB 3-0 SH27 (SUTURE) ×1 IMPLANT
SUT MON AB 5-0 PS2 18 (SUTURE) ×3 IMPLANT
SUT PDS 3-0 CT2 (SUTURE) ×4
SUT PDS AB 2-0 CT2 27 (SUTURE) ×5 IMPLANT
SUT PDS II 3-0 CT2 27 ABS (SUTURE) IMPLANT
SUT SILK 2 0 SH (SUTURE) ×1 IMPLANT
SUT SILK 3 0 PS 1 (SUTURE) ×2 IMPLANT
SUT VIC AB 3-0 SH 27 (SUTURE)
SUT VIC AB 3-0 SH 27X BRD (SUTURE) IMPLANT
SUT VICRYL 3-0 CR8 SH (SUTURE) ×2 IMPLANT
SYR BULB IRRIG 60ML STRL (SYRINGE) ×2 IMPLANT
SYR CONTROL 10ML LL (SYRINGE) IMPLANT
TOWEL GREEN STERILE FF (TOWEL DISPOSABLE) ×4 IMPLANT
TRAY DSU PREP LF (CUSTOM PROCEDURE TRAY) ×2 IMPLANT
TUBE CONNECTING 20X1/4 (TUBING) ×2 IMPLANT
UNDERPAD 30X36 HEAVY ABSORB (UNDERPADS AND DIAPERS) ×4 IMPLANT
YANKAUER SUCT BULB TIP NO VENT (SUCTIONS) ×2 IMPLANT

## 2019-10-19 NOTE — Progress Notes (Signed)
Assisted Dr. Carswell Jackson with left, ultrasound guided, pectoralis block. Side rails up, monitors on throughout procedure. See vital signs in flow sheet. Tolerated Procedure well. 

## 2019-10-19 NOTE — Anesthesia Procedure Notes (Signed)
Procedure Name: Intubation Date/Time: 10/19/2019 10:39 AM Performed by: Glory Buff, CRNA Pre-anesthesia Checklist: Patient identified, Emergency Drugs available, Suction available and Patient being monitored Patient Re-evaluated:Patient Re-evaluated prior to induction Oxygen Delivery Method: Circle system utilized Preoxygenation: Pre-oxygenation with 100% oxygen Induction Type: IV induction Ventilation: Mask ventilation without difficulty Laryngoscope Size: Miller and 3 Grade View: Grade I Tube type: Oral Number of attempts: 1 Airway Equipment and Method: Stylet and Oral airway Placement Confirmation: ETT inserted through vocal cords under direct vision,  positive ETCO2 and breath sounds checked- equal and bilateral Secured at: 21 cm Tube secured with: Tape Dental Injury: Teeth and Oropharynx as per pre-operative assessment

## 2019-10-19 NOTE — Op Note (Signed)
Op report    DATE OF OPERATION:  10/19/2019  LOCATION: [facility]  SURGICAL DIVISION: Plastic Surgery  PREOPERATIVE DIAGNOSES:  1. Left Breast cancer.    POSTOPERATIVE DIAGNOSES:  1. Left Breast cancer.   PROCEDURE:  1. Left immediate breast reconstruction with placement of Acellular Dermal Matrix and tissue expanders.  SURGEON: Keelin Sheridan Sanger Ashyia Schraeder, DO  ASSISTANT: Phoebe Sharps, PA  ANESTHESIA:  General.   COMPLICATIONS: None.   IMPLANTS: Left - Mentor 535 cc. Ref #[id number].  Serial Number [id number], 200 cc of injectable saline placed in the expander. Acellular Dermal Matrix 6 x 16 cm  INDICATIONS FOR PROCEDURE:  The patient, Lauren Garrison, is a 59 y.o. female born on 02-Mar-1960, is here for  immediate first stage breast reconstruction with placement of left tissue expander and Acellular dermal matrix. MRN: [id number]  CONSENT:  Informed consent was obtained directly from the patient. Risks, benefits and alternatives were fully discussed. Specific risks including but not limited to bleeding, infection, hematoma, seroma, scarring, pain, implant infection, implant extrusion, capsular contracture, asymmetry, wound healing problems, and need for further surgery were all discussed. The patient did have an ample opportunity to have her questions answered to her satisfaction.   DESCRIPTION OF PROCEDURE:  The patient was taken to the operating room by the general surgery team. SCDs were placed and IV antibiotics were given. The patient's chest was prepped and draped in a sterile fashion. A time out was performed and the implants to be used were identified.  A left mastectomy was performed.  Once the general surgery team had completed their portion of the case the patient was rendered to the plastic and reconstructive surgery team.  Left:  The pectoralis major muscle was lifted from the chest wall with release of the lateral edge and lateral inframammary  fold.  The pocket was irrigated with antibiotic solution and hemostasis was achieved with electrocautery.  The ADM was then prepared according to the manufacture guidelines and slits placed to help with postoperative fluid management.  The ADM was then sutured to the inferior and lateral edge of the inframammary fold with 2-0 PDS starting with an interrupted stitch and then a running stitch.  The lateral portion was sutured to with interrupted sutures after the expander was placed.  The expander was prepared according to the manufacture guidelines, the air evacuated and then it was placed under the ADM and pectoralis major muscle.  The inferior and lateral tabs were used to secure the expander to the chest wall with 2-0 PDS.  The drain was placed at the inframammary fold over the ADM and secured to the skin with 3-0 Silk.    The deep layers were closed with 3-0 Monocryl followed by 4-0 Monocryl.  The skin was closed with 5-0 Monocryl and then dermabond was applied.  The ABDs and breast binder were placed.  The patient tolerated the procedure well and there were no complications.  The patient was allowed to wake from anesthesia and taken to the recovery room in satisfactory condition.   The advanced practice practitioner (APP) assisted throughout the case.  The APP was essential in retraction and counter traction when needed to make the case progress smoothly.  This retraction and assistance made it possible to see the tissue plans for the procedure.  The assistance was needed for blood control, tissue re-approximation and assisted with closure of the incision site.

## 2019-10-19 NOTE — Transfer of Care (Signed)
Immediate Anesthesia Transfer of Care Note  Patient: Lauren Garrison  Procedure(s) Performed: LEFT MODIFIED RADICAL MASTECTOMY (Left Breast) BREAST RECONSTRUCTION WITH PLACEMENT OF TISSUE EXPANDER AND FLEX HD (ACELLULAR HYDRATED DERMIS) (Left Breast)  Patient Location: PACU  Anesthesia Type:General  Level of Consciousness: drowsy, patient cooperative and responds to stimulation  Airway & Oxygen Therapy: Patient Spontanous Breathing and Patient connected to face mask oxygen  Post-op Assessment: Report given to RN and Post -op Vital signs reviewed and stable  Post vital signs: Reviewed and stable  Last Vitals:  Vitals Value Taken Time  BP 127/77 10/19/19 1309  Temp    Pulse 91 10/19/19 1310  Resp    SpO2 100 % 10/19/19 1310  Vitals shown include unvalidated device data.  Last Pain:  Vitals:   10/19/19 0905  TempSrc: Oral  PainSc: 0-No pain         Complications: No complications documented.

## 2019-10-19 NOTE — Anesthesia Postprocedure Evaluation (Signed)
Anesthesia Post Note  Patient: Lauren Garrison  Procedure(s) Performed: LEFT MODIFIED RADICAL MASTECTOMY (Left Breast) BREAST RECONSTRUCTION WITH PLACEMENT OF TISSUE EXPANDER AND FLEX HD (ACELLULAR HYDRATED DERMIS) (Left Breast)     Patient location during evaluation: PACU Anesthesia Type: General Level of consciousness: awake and alert, oriented and patient cooperative Pain management: pain level controlled Vital Signs Assessment: post-procedure vital signs reviewed and stable Respiratory status: spontaneous breathing, nonlabored ventilation and respiratory function stable Cardiovascular status: blood pressure returned to baseline and stable Postop Assessment: no apparent nausea or vomiting Anesthetic complications: no   No complications documented.  Last Vitals:  Vitals:   10/19/19 1315 10/19/19 1330  BP: (!) 125/59 (!) 124/59  Pulse: 91 85  Resp: 18 (!) 21  Temp:    SpO2: 99% 98%    Last Pain:  Vitals:   10/19/19 1330  TempSrc:   PainSc: 0-No pain                 Floy Angert,E. Lan Entsminger

## 2019-10-19 NOTE — Interval H&P Note (Signed)
History and Physical Interval Note:  10/19/2019 10:05 AM  Lauren Garrison  has presented today for surgery, with the diagnosis of LEFT BREAST CANCER.  The various methods of treatment have been discussed with the patient and family. After consideration of risks, benefits and other options for treatment, the patient has consented to  Procedure(s): LEFT MODIFIED RADICAL MASTECTOMY (Left) BREAST RECONSTRUCTION WITH PLACEMENT OF TISSUE EXPANDER AND FLEX HD (ACELLULAR HYDRATED DERMIS) (Left) as a surgical intervention.  The patient's history has been reviewed, patient examined, no change in status, stable for surgery.  I have reviewed the patient's chart and labs.  Questions were answered to the patient's satisfaction.     [name]

## 2019-10-19 NOTE — Anesthesia Preprocedure Evaluation (Addendum)
Anesthesia Evaluation  Patient identified by MRN, date of birth, ID band Patient awake    Reviewed: Allergy & Precautions, NPO status , Patient's Chart, lab work & pertinent test results  History of Anesthesia Complications (+) PONV  Airway Mallampati: I  TM Distance: >3 FB Neck ROM: Full    Dental  (+) Dental Advisory Given, Teeth Intact   Pulmonary  10/15/2019 SARS coronavirus NEG   breath sounds clear to auscultation       Cardiovascular hypertension, Pt. on medications (-) angina Rhythm:Regular Rate:Normal     Neuro/Psych  Headaches,    GI/Hepatic negative GI ROS, Neg liver ROS,   Endo/Other  Morbid obesity  Renal/GU negative Renal ROS     Musculoskeletal  (+) Arthritis , Osteoarthritis,    Abdominal (+) + obese,   Peds  Hematology negative hematology ROS (+)   Anesthesia Other Findings Breast cancer  Reproductive/Obstetrics                            Anesthesia Physical Anesthesia Plan  ASA: III  Anesthesia Plan: General   Post-op Pain Management: GA combined w/ Regional for post-op pain   Induction: Intravenous  PONV Risk Score and Plan: 4 or greater and Scopolamine patch - Pre-op, Dexamethasone and Ondansetron  Airway Management Planned: Oral ETT  Additional Equipment: None  Intra-op Plan:   Post-operative Plan: Extubation in OR  Informed Consent: I have reviewed the patients History and Physical, chart, labs and discussed the procedure including the risks, benefits and alternatives for the proposed anesthesia with the patient or authorized representative who has indicated his/her understanding and acceptance.     Dental advisory given  Plan Discussed with: CRNA and Surgeon  Anesthesia Plan Comments: (Plan routine monitors, GA with pectoralis bloc for post op analgesia)       Anesthesia Quick Evaluation

## 2019-10-19 NOTE — Anesthesia Procedure Notes (Signed)
Anesthesia Regional Block: Pectoralis block   Pre-Anesthetic Checklist: ,, timeout performed, Correct Patient, Correct Site, Correct Laterality, Correct Procedure, Correct Position, site marked, Risks and benefits discussed,  Surgical consent,  Pre-op evaluation,  At surgeon's request and post-op pain management  Laterality: Left  Prep: chloraprep       Needles:  Injection technique: Single-shot     Needle Length: 9cm  Needle Gauge: 21     Additional Needles:   Procedures:,,,, ultrasound used (permanent image in chart),,,,  Narrative:  Start time: 10/19/2019 10:01 AM End time: 10/19/2019 10:07 AM Injection made incrementally with aspirations every 5 mL.  Performed by: Personally  Anesthesiologist: Annye Asa, MD  Additional Notes: Pt identified in Holding room.  Monitors applied. Working IV access confirmed. Sterile prep L clavicle and pec.  #21ga ECHOgenic arrow block needle between pec minor and serratus, between ribs 4,5 with US guidance.  30cc 0.5% Bupivacaine with 1:200k epi injected incrementally after negative test dose, good planar spread.  Patient asymptomatic, VSS, no heme aspirated, tolerated well.  Jenita Seashore, MD
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
## 2019-10-19 NOTE — Discharge Instructions (Signed)
INSTRUCTIONS FOR AFTER BREAST SURGERY   You are getting ready to undergo breast surgery.  You will likely have some questions about what to expect following your operation.  The following information will help you and your family understand what to expect when you are discharged from the hospital.  Following these guidelines will help ensure a smooth recovery and reduce risks of complications.   Postoperative instructions include information on: diet, wound care, medications and physical activity.  AFTER SURGERY Expect to go home after the procedure.  In some cases, you may need to spend one night in the hospital for observation.  DIET Breast surgery does not require a specific diet.  However, the healthier you eat the better your body can start healing. It is important to increasing your protein intake.  This means limiting the foods with sugar and carbohydrates.  Focus on vegetables and some meat.  If you have any liposuction during your procedure be sure to drink water.  If your urine is bright yellow, then it is concentrated, and you need to drink more water.  As a general rule after surgery, you should have 8 ounces of water every hour while awake.  If you find you are persistently nauseated or unable to take in liquids let us know.  NO TOBACCO USE or EXPOSURE.  This will slow your healing process and increase the risk of a wound.  WOUND CARE If you have a drain: You can shower five days after surgery.  Clean with baby wipes until the drain is removed.   If you have steri-strips / tape directly attached to your skin leave them in place. It is OK to get these wet.  No baths, pools or hot tubs for two weeks. We close your incision to leave the smallest and best-looking scar. No ointment or creams on your incisions until given the go ahead.  Especially not Neosporin (Too many skin reactions with this one).  A few weeks after surgery you can use Mederma and start massaging the scar. We ask you to  wear your binder or sports bra for the first 6 weeks around the clock, including while sleeping. This provides added comfort and helps reduce the fluid accumulation at the surgery site.  ACTIVITY No heavy lifting until cleared by the doctor.  This usually means no more than a half-gallon of milk.  It is OK to walk and climb stairs. In fact, moving your legs is very important to decrease your risk of a blood clot.  It will also help keep you from getting deconditioned.  Every 1 to 2 hours get up and walk for 5 minutes. This will help with a quicker recovery back to normal.  Let pain be your guide so you don't do too much.  This is not the time for spring cleaning and don't plan on taking care of anyone else.  This time is for you to recover,  You will be more comfortable if you sleep and rest with your head elevated either with a few pillows under you or in a recliner.  No stomach sleeping for a three months.  WORK Everyone returns to work at different times. As a rough guide, most people take at least 1 - 2 weeks off prior to returning to work. If you need documentation for your job, bring the forms to your postoperative follow up visit.  DRIVING Arrange for someone to bring you home from the hospital.  You may be able to drive a few  days after surgery but not while taking any narcotics or valium.  BOWEL MOVEMENTS Constipation can occur after anesthesia and while taking pain medication.  It is important to stay ahead for your comfort.  We recommend taking Milk of Magnesia (2 tablespoons; twice a day) while taking the pain pills.  SEROMA This is fluid your body tried to put in the surgical site.  This is normal but if it creates tight skinny skin let us know.  It usually decreases in a few weeks.  MEDICATIONS and PAIN CONTROL At your preoperative visit for you history and physical you were given the following medications: 1. An antibiotic: Start this medication when you get home and take according  to the instructions on the bottle. 2. Zofran 4 mg:  This is to treat nausea and vomiting.  You can take this every 6 hours as needed and only if needed. 3. Valium 2 mg: This is for muscle tightness if you have an implant or expander. This will help relax your muscle which also helps with pain control.  This can be taken every 12 hours as needed.  Don't drive after taking this medication. 4. Norco (hydrocodone/acetaminophen) 5/325 mg:  This is only to be used after you have taken the motrin or the tylenol. Every 8 hours as needed. Over the counter Medication to take: 5. Ibuprofen (Motrin) 600 mg:  Take this every 6 hours.  If you have additional pain then take 500 mg of the tylenol every 8 hours.  Only take the Norco after you have tried these two. 6. Miralax or stool softener of choice: Take this according to the bottle if you take the [place] Call your surgeon's office if any of the following occur: . Fever 101 degrees F or greater . Excessive bleeding or fluid from the incision site. . Pain that increases over time without aid from the medications . Redness, warmth, or pus draining from incision sites . Persistent nausea or inability to take in liquids . Severe misshapen area that underwent the operation.  Here are some resources:  1. Plastic surgery website: https://www.plasticsurgery.org/for-medical-professionals/education-and-resources/publications/breast-reconstruction-magazine 2. Breast Reconstruction Awareness Campaign:  HotelLives.co.nz 3. Plastic surgery Implant information:  https://www.plasticsurgery.org/patient-safety/breast-implant-safety  [facility] Plastic Surgery Specialist  What is the benefit of having a drain?  During surgery your tissue layers are separated.  This raw surface stimulates your body to fill the space with serous fluid.  This is normal but you don't want that fluid to collect and prevent healing.  A fluid collection can also become  infected.  The Jackson-Pratt (JP) drain is used to eliminate this collection of fluid and allow the tissue to heal together.    Jackson-Pratt (JP) bulb    How to care for your drainage and suction unit at home Your drainage catheter will be connected to a collection device. The vacuum caused when the device is compressed allows drainage to collect in the device.    Wendee Copp your hands with soap and water before and after touching the system. . Empty the JP drain every 12 hours once you get home from your procedure. . Record the fluid amount on the record sheet included. . Start with stripping the drain tube to push the clots or excess fluid to the bulb.  Do this by pinching the tube with one hand near your skin.  Then with the other hand squeeze the tubing and work it toward the bulb.  This should be done several times a day.  This may collapse the tube which will correct on its own.   . Use a safety pin to attach your collection device to your clothing so there is no tension on the insertion site.   . If you have drainage at the skin insertion site, you can apply a gauze dressing and secure it with tape. . If the drain falls out, apply a gauze dressing over the drain insertion site and secure with tape.   To empty the collection device:   . Release the stopper on the top of the collection unit (bulb).  Signa Kell contents into a measuring container such as a plastic medicine cup.  . Record the day and amount of drainage on the attached sheet. . This should be done at least twice a day.    To compress the Jackson-Pratt Bulb:  . Release the stopper at the top of the bulb. [name] Squeeze the bulb tightly in your fist, squeezing air out of the bulb.  . Replace the stopper while the bulb is compressed.  . Be careful not to spill the contents when squeezing the bulb. . The drainage will start bright red and turn to pink and then yellow with time. . IMPORTANT: If the bulb is not squeezed before adding the  stopper it will not draw out the fluid.  Care for the JP drain site and your skin daily:  . You may shower three days after surgery. . Secure the drain to a ribbon or cloth around your waist while showering so it does not pull out while showering. . Be sure your hands are cleaned with soap and water. . Use a clean wet cotton swab to clean the skin around the drain site.  . Use another cotton swab to place Vaseline or antibiotic ointment on the skin around the drain.     Contact your physician if any of the following occur:  [name] The fluid in the bulb becomes cloudy. . Your temperature is greater than 101.4.  [name] The incision opens. . If you have drainage at the skin insertion site, you can apply a gauze dressing and secure it with tape. . If the drain falls out, apply a gauze dressing over the drain insertion site and secure with tape.  . You will usually have more drainage when you are active than while you rest or are asleep. If the drainage increases significantly or is bloody call the physician                             Bring this record with you to each office visit Date  Drainage Volume  Date   Drainage volume

## 2019-10-19 NOTE — Op Note (Signed)
10/19/2019  11:55 AM  PATIENT:  Lauren Garrison  59 y.o. female  PRE-OPERATIVE DIAGNOSIS:  LEFT BREAST CANCER  POST-OPERATIVE DIAGNOSIS:  LEFT BREAST CANCER  PROCEDURE:  Procedure(s): LEFT MODIFIED RADICAL MASTECTOMY (Left)  SURGEON:  Surgeon(s) and Role: Panel 1:    * Jovita Kussmaul, MD - Primary  PHYSICIAN ASSISTANT:   ASSISTANTS: none   ANESTHESIA:   general  EBL:  minimal   BLOOD ADMINISTERED:none  DRAINS: none   LOCAL MEDICATIONS USED:  MARCAINE     SPECIMEN:  Source of Specimen:  left mastectomy and axillary contents  DISPOSITION OF SPECIMEN:  PATHOLOGY  COUNTS:  YES  TOURNIQUET:  * No tourniquets in log *  DICTATION: .Dragon Dictation   After informed consent was obtained the patient was brought to the operating room and placed in the supine position on the operating table.  After adequate induction of general anesthesia the patient's bilateral chest, breast, and axillary areas were prepped with ChloraPrep, allowed to dry, and draped in usual sterile manner.  An appropriate timeout was performed.  The patient has a recurrent cancer in the left breast and has had previous radiation.  She also has a positive lymph node.  Because of this she has elected for left modified radical mastectomy.  An elliptical incision was made around the nipple and areola complex to try to spare as much skin as possible while still removing her old scar.  The incision was carried through the skin and subcutaneous tissue sharply with the electrocautery.  Breast hooks were then used to elevate the skin flaps anteriorly towards the ceiling.  Thin skin flaps were then created circumferentially by dissecting between the breast tissue and the subcutaneous fat.  This dissection was carried all the way to the chest wall.  The breast was then removed from the pectoralis muscle with the pectoralis fascia sharply with the electrocautery.  A couple of small perforating vessels medially were  controlled with figure-of-eight 3-0 Vicryl stitches.  The lateral dissection was carried all the way to the latissimus muscle.  Once the dissection reached the axilla then I was able to identify the serratus muscle medially, the latissimus muscle laterally, and the axillary vein superiorly.  The lymphatic contents of the axilla within these boundaries was then dissected out with blunt right angle dissection.  Several small vessels and lymphatics and intercostal brachial nerves were controlled with clips.  The long thoracic and thoracodorsal nerves were identified and spared.  Once this was accomplished the entire left axillary contents were removed with the breast in the entire specimen was marked with a stitch on the lateral skin and sent to pathology for further evaluation.  Hemostasis was achieved using the Bovie electrocautery.  At this point the patient was tolerating the operation well.  All needle sponge and instrument counts were correct.  The operation at this point was turned over to Dr. Marla Roe for the reconstruction.  Her portion will be dictated separately.  PLAN OF CARE: Admit for overnight observation  PATIENT DISPOSITION:  PACU - hemodynamically stable.   Delay start of Pharmacological VTE agent (>24hrs) due to surgical blood loss or risk of bleeding: not applicable

## 2019-10-19 NOTE — Interval H&P Note (Signed)
History and Physical Interval Note:  10/19/2019 11:06 AM  Lauren Garrison  has presented today for surgery, with the diagnosis of LEFT BREAST CANCER.  The various methods of treatment have been discussed with the patient and family. After consideration of risks, benefits and other options for treatment, the patient has consented to  Procedure(s): LEFT MODIFIED RADICAL MASTECTOMY (Left) BREAST RECONSTRUCTION WITH PLACEMENT OF TISSUE EXPANDER AND FLEX HD (ACELLULAR HYDRATED DERMIS) (Left) as a surgical intervention.  The patient's history has been reviewed, patient examined, no change in status, stable for surgery.  I have reviewed the patient's chart and labs.  Questions were answered to the patient's satisfaction.     [name]

## 2019-10-20 ENCOUNTER — Encounter (HOSPITAL_BASED_OUTPATIENT_CLINIC_OR_DEPARTMENT_OTHER): Payer: Self-pay | Admitting: General Surgery

## 2019-10-20 DIAGNOSIS — C50412 Malignant neoplasm of upper-outer quadrant of left female breast: Secondary | ICD-10-CM | POA: Diagnosis not present

## 2019-10-20 NOTE — Discharge Summary (Signed)
Physician Discharge Summary  Patient ID: Lauren Garrison MRN: [id number] DOB/AGE: [birth year] 59 y.o.  Admit date: 10/19/2019 Discharge date: 10/20/2019  Admission Diagnoses:  Discharge Diagnoses:  Active Problems:   Breast cancer [facility])   Discharged Condition: good  Hospital Course: Patient is resting comfortably in bed, No overnight events. Denies F/C, CP, SOB, N/V.  Incision is intact, clean and dry. Steri-stips and honeycomb dressing in place. Drain in place with Serosanguinous fluid in bulb. Pain well controlled.  Consults: None  Significant Diagnostic Studies: none  Treatments: surgery: right mastectomy with placement of tissue expander and flax HD.  Discharge Exam: Blood pressure (!) 137/58, pulse 70, temperature 98.2 F (36.8 C), resp. rate 20, height 5\' 4"  (1.626 m), weight 108.2 kg, SpO2 93 %. General appearance: alert, cooperative, no distress and very pleasent Head: Normocephalic, without obvious abnormality, atraumatic Eyes: EOMs intact Resp: Nonlabored Chest wall: Symmetric rise and fall Breasts: Right brest incision intact, clean and dry. No signs of infection, redness, drainage, seroma/hematoma. Honeycomb dressing and steristrips in place. No drainage on gauze. Drain in place with serosanguinous fluid in bulb.  Disposition: Discharge disposition: 01-Home or Self Care       Discharge Instructions    Call MD for:  difficulty breathing, headache or visual disturbances   Complete by: As directed    Call MD for:  extreme fatigue   Complete by: As directed    Call MD for:  hives   Complete by: As directed    Call MD for:  persistant dizziness or light-headedness   Complete by: As directed    Call MD for:  persistant nausea and vomiting   Complete by: As directed    Call MD for:  redness, tenderness, or signs of infection (pain, swelling, redness, odor or green/yellow discharge around incision site)   Complete by: As directed    Call MD for:  severe  uncontrolled pain   Complete by: As directed    Call MD for:  temperature >100.4   Complete by: As directed    Diet - low sodium heart healthy   Complete by: As directed    Increase activity slowly   Complete by: As directed      Allergies as of 10/20/2019      Reactions   Lovenox [enoxaparin Sodium] Shortness Of Breath   Sulfa Antibiotics Hives      Medication List    TAKE these medications   acetaminophen 500 MG tablet Commonly known as: TYLENOL Take 1 tablet (500 mg total) by mouth every 6 (six) hours as needed. For use AFTER surgery   amLODipine 10 MG tablet Commonly known as: NORVASC Take 1 tablet (10 mg total) by mouth daily.   BIOTIN PO Take by mouth daily.   diazepam 2 MG tablet Commonly known as: Valium Take 1 tablet (2 mg total) by mouth every 12 (twelve) hours as needed for muscle spasms.   ibuprofen 600 MG tablet Commonly known as: ADVIL Take 1 tablet (600 mg total) by mouth every 6 (six) hours as needed for mild pain or moderate pain. For use AFTER surgery   ondansetron 4 MG tablet Commonly known as: Zofran Take 1 tablet (4 mg total) by mouth every 8 (eight) hours as needed for nausea or vomiting.   Turmeric 500 MG Caps Take 1,000 mg by mouth daily.       Follow-up Information    Dillingham, [name], DO In 1 week.   Specialty: Plastic Surgery Contact information: [address] [place] [address] [phone]        [name], MD In 2 weeks.   Specialty: General Surgery Contact information: [address] [place] [address] [phone]                Signed: Threasa Heads 10/20/2019, 7:12 AM

## 2019-10-21 LAB — SURGICAL PATHOLOGY

## 2019-10-25 ENCOUNTER — Encounter: Payer: Self-pay | Admitting: *Deleted

## 2019-10-28 ENCOUNTER — Encounter: Payer: Self-pay | Admitting: Plastic Surgery

## 2019-10-28 ENCOUNTER — Ambulatory Visit (INDEPENDENT_AMBULATORY_CARE_PROVIDER_SITE_OTHER): Payer: 59 | Admitting: Plastic Surgery

## 2019-10-28 ENCOUNTER — Other Ambulatory Visit: Payer: Self-pay

## 2019-10-28 VITALS — BP 122/70 | HR 96 | Temp 99.0°F

## 2019-10-28 DIAGNOSIS — Z9010 Acquired absence of unspecified breast and nipple: Secondary | ICD-10-CM | POA: Insufficient documentation

## 2019-10-28 DIAGNOSIS — Z9012 Acquired absence of left breast and nipple: Secondary | ICD-10-CM

## 2019-10-28 DIAGNOSIS — C50412 Malignant neoplasm of upper-outer quadrant of left female breast: Secondary | ICD-10-CM

## 2019-10-28 DIAGNOSIS — Z170 Estrogen receptor positive status [ER+]: Secondary | ICD-10-CM

## 2019-10-28 NOTE — Progress Notes (Signed)
   Subjective:    Patient ID: Lauren Garrison, female    DOB: 1960-02-26, 59 y.o.   MRN: [id number]  The patient underwent a left modified radical mastectomy with Dr. Marlou Starks.  She had immediate reconstruction with expander and Flex HD placed.  Overall she is doing extremely well.  I was able to do a little bit of massage on the medial aspect which help break up a little bit of a seroma.  The incisions are healing well and no sign of infection.  Her drain output is minimal but too much to remove the drain at this time.  She is doing extremely well with pain management.  200 cc of normal saline was placed in the left breast expander at the time of surgery.     Review of Systems  Constitutional: Negative.   HENT: Negative.   Eyes: Negative.   Respiratory: Negative.   Cardiovascular: Negative.   Endocrine: Negative.   Genitourinary: Negative.   Musculoskeletal: Negative.        Objective:   Physical Exam Vitals and nursing note reviewed.  Constitutional:      Appearance: Normal appearance.  Cardiovascular:     Rate and Rhythm: Normal rate.     Pulses: Normal pulses.  Neurological:     Mental Status: She is alert. Mental status is at baseline.  Psychiatric:        Mood and Affect: Mood normal.        Assessment & Plan:     ICD-10-CM   1. Malignant neoplasm of upper-outer quadrant of left breast in female, estrogen receptor positive ([place])  C50.412    Z17.0   2. Acquired absence of left breast  Z90.12      We placed injectable saline in the Expander using a sterile technique: Left: 50 cc for a total of 250 / 535 cc  She can go into the sports bra as well.

## 2019-10-31 NOTE — Progress Notes (Signed)
Patient is a 59 yr-old female here for follow-up after undergoing a left radical mastectomy with Dr. Marlou Starks and left breast reconstruction with placement of tissue expander and flex HD with Dr. Marla Roe on 10/19/19.  ~ 2 weeks PO Patient reports she is doing well.  Has some tightness of the pec muscle after her last fill. Drain output was 30 cc yesterday, but has ranged from 50 - 100 over the prior 4 days. Will leave the drain in a little longer.  Honeycomb dressing was removed.  Steri-Strips and Dermabond in place.  Incision is intact, C/D.  No signs of infection, redness, drainage, seroma/hematoma.  She has an area of firmness on the medial aspect of the breast with an area of indention.  She should continue to massage this area as tolerated.   Continue to wear compression 24/7.  Continue to avoid heavy lifting.  May shower, avoid scrubbing the incision.  She has a follow-up appointment on the 27th.  May come back sooner if the drain output drops below 20 cc for a few days in a row.  We will plan for a fill at her next visit.  Call office with any questions/concerns.  We placed injectable saline in the Expander using a sterile technique: Left: 0 cc for a total of 250 / 535 cc  The 21st Century Cures Act was signed into law in 2016 which includes the topic of electronic health records.  This provides immediate access to information in MyChart.  This includes consultation notes, operative notes, office notes, lab results and pathology reports.  If you have any questions about what you read please let us know at your next visit or call us at the office.  We are right here with you.

## 2019-11-04 ENCOUNTER — Ambulatory Visit (INDEPENDENT_AMBULATORY_CARE_PROVIDER_SITE_OTHER): Payer: 59 | Admitting: Plastic Surgery

## 2019-11-04 ENCOUNTER — Other Ambulatory Visit: Payer: Self-pay

## 2019-11-04 ENCOUNTER — Inpatient Hospital Stay: Payer: 59 | Attending: Oncology | Admitting: Oncology

## 2019-11-04 ENCOUNTER — Encounter: Payer: Self-pay | Admitting: Plastic Surgery

## 2019-11-04 VITALS — BP 143/68 | HR 89 | Temp 97.4°F | Resp 18 | Ht 64.0 in | Wt 243.1 lb

## 2019-11-04 VITALS — BP 136/83 | HR 95 | Temp 99.3°F

## 2019-11-04 DIAGNOSIS — Z170 Estrogen receptor positive status [ER+]: Secondary | ICD-10-CM

## 2019-11-04 DIAGNOSIS — C50412 Malignant neoplasm of upper-outer quadrant of left female breast: Secondary | ICD-10-CM

## 2019-11-04 DIAGNOSIS — I10 Essential (primary) hypertension: Secondary | ICD-10-CM | POA: Insufficient documentation

## 2019-11-04 DIAGNOSIS — Z923 Personal history of irradiation: Secondary | ICD-10-CM | POA: Insufficient documentation

## 2019-11-04 DIAGNOSIS — Z9012 Acquired absence of left breast and nipple: Secondary | ICD-10-CM

## 2019-11-04 DIAGNOSIS — Z79899 Other long term (current) drug therapy: Secondary | ICD-10-CM | POA: Diagnosis not present

## 2019-11-04 DIAGNOSIS — E559 Vitamin D deficiency, unspecified: Secondary | ICD-10-CM

## 2019-11-04 MED ORDER — PROCHLORPERAZINE MALEATE 10 MG PO TABS: 10.0000 mg | ORAL_TABLET | Freq: Four times a day (QID) | ORAL | 1 refills | Status: DC | PRN

## 2019-11-04 NOTE — Progress Notes (Signed)
START OFF PATHWAY REGIMEN - Breast   [id number]:CMF (IV cyclophosphamide) q21 days:   A cycle is every 21 days:     Cyclophosphamide      Methotrexate      Fluorouracil   **Always confirm dose/schedule in your pharmacy ordering system**  Patient Characteristics: Postoperative without Neoadjuvant Therapy (Pathologic Staging), Invasive Disease, Adjuvant Therapy, HER2 Negative/Unknown/Equivocal, ER Positive, Node Positive, Node Positive (1-3), MammaPrint(R) Ordered, High Genomic Risk Therapeutic Status: Postoperative without Neoadjuvant Therapy (Pathologic Staging) AJCC Grade: G2 AJCC N Category: pN1 AJCC M Category: cM0 ER Status: Positive (+) AJCC 8 Stage Grouping: IB HER2 Status: Negative (-) Oncotype Dx Recurrence Score: Not Appropriate AJCC T Category: pT2 PR Status: Positive (+) Has this patient completed genomic testing<= Yes - MammaPrint(R) MammaPrint(R) Score: High Genomic Risk Intent of Therapy: Curative Intent, Discussed with Patient
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
## 2019-11-07 ENCOUNTER — Encounter: Payer: Self-pay | Admitting: *Deleted

## 2019-11-07 ENCOUNTER — Telehealth: Payer: Self-pay | Admitting: Oncology

## 2019-11-07 NOTE — Telephone Encounter (Signed)
Scheduled appts per 9/17 los. Pt confirmed appt dates and times.

## 2019-11-11 ENCOUNTER — Telehealth: Payer: Self-pay | Admitting: Plastic Surgery

## 2019-11-11 ENCOUNTER — Encounter: Payer: 59 | Admitting: Plastic Surgery

## 2019-11-11 NOTE — Telephone Encounter (Addendum)
Returned patients call. LMVM to call back. Need to know specifically what medications needs needs a refill   Patient has a follow up appointment Monday 11/14/19

## 2019-11-11 NOTE — Telephone Encounter (Signed)
Patient stopped by the office to see if she could get a refill on her medications. She doesn't need the antibiotic. Also, she said a sponge like U steri strip was applied to her nipple after surgery and she would like some more of those. Please call patient if you have any other questions. Patient would like call to advise when prescriptions have been called in.

## 2019-11-11 NOTE — Progress Notes (Signed)
Patient is a 59 year old female here for follow-up after undergoing a left radical mastectomy with Dr. Marlou Starks and left breast reconstruction with placement of tissue expander and Flex HD with Dr. Marla Roe on 10/19/2019.  ~ 4 weeks PO Patient reports overall she is doing very well.  Reports pain with the drain and states that she cut the stitch to make it more comfortable.  Drain was removed today.  Right breast incision is healing nicely, C/D/I.  No signs of infection, redness, drainage, seroma/hematoma.  Remaining Steri-Strips were removed.  Patient denies fever/chills, nausea/vomiting.  Reports pain has been well controlled.  Patient reports no issues with her last fill.  We placed injectable saline in the Expander using a sterile technique: Left: 50 cc for a total of 300 / 535 cc  Refill sent per patient request for Valium and Tylenol.  Continue to wear sports bra 24/7 for 2 more weeks and then no longer needs to wear it at night.  Continue to avoid heavy lifting.  May apply Vaseline along incision and surrounding breast tissue for moisture.  May shower normally, avoid scrubbing incision.  Follow-up in 2 weeks for possible additional fill.  Call office with any questions/concerns.  The [place] was signed into law in 2016 which includes the topic of electronic health records.  This provides immediate access to information in MyChart.  This includes consultation notes, operative notes, office notes, lab results and pathology reports.  If you have any questions about what you read please let us know at your next visit or call us at the office.  We are right here with you.

## 2019-11-14 ENCOUNTER — Ambulatory Visit (INDEPENDENT_AMBULATORY_CARE_PROVIDER_SITE_OTHER): Payer: 59 | Admitting: Plastic Surgery

## 2019-11-14 ENCOUNTER — Encounter: Payer: Self-pay | Admitting: Plastic Surgery

## 2019-11-14 ENCOUNTER — Other Ambulatory Visit: Payer: Self-pay

## 2019-11-14 ENCOUNTER — Encounter: Payer: Self-pay | Admitting: *Deleted

## 2019-11-14 VITALS — BP 153/81 | HR 86 | Temp 98.9°F

## 2019-11-14 DIAGNOSIS — Z9012 Acquired absence of left breast and nipple: Secondary | ICD-10-CM

## 2019-11-14 MED ORDER — ACETAMINOPHEN 500 MG PO TABS: 500.0000 mg | ORAL_TABLET | Freq: Four times a day (QID) | ORAL | 0 refills | Status: DC | PRN

## 2019-11-14 MED ORDER — DIAZEPAM 2 MG PO TABS: 2.0000 mg | ORAL_TABLET | Freq: Two times a day (BID) | ORAL | 0 refills | Status: DC | PRN

## 2019-11-14 NOTE — Telephone Encounter (Signed)
Patient coming in today for her follow up.

## 2019-11-16 ENCOUNTER — Other Ambulatory Visit (INDEPENDENT_AMBULATORY_CARE_PROVIDER_SITE_OTHER): Payer: 59 | Admitting: Plastic Surgery

## 2019-11-16 DIAGNOSIS — Z9012 Acquired absence of left breast and nipple: Secondary | ICD-10-CM

## 2019-11-16 MED ORDER — DOXYCYCLINE HYCLATE 100 MG PO TABS: 100.0000 mg | ORAL_TABLET | Freq: Two times a day (BID) | ORAL | 0 refills | Status: AC

## 2019-11-16 NOTE — Progress Notes (Signed)
Spoke with patient.  She reports a fever over the last 24 hours and a small amount of redness and pus on her incision. Fever controlled with Tylenol and Ibuprofen.  Denies N/V, CP, SOB. Sent Doxy to pharmacy for her.  Call office if condition worsens and with questions/concerns.

## 2019-11-17 ENCOUNTER — Other Ambulatory Visit: Payer: Self-pay

## 2019-11-18 ENCOUNTER — Ambulatory Visit: Payer: 59 | Admitting: Internal Medicine

## 2019-11-18 ENCOUNTER — Other Ambulatory Visit: Payer: Self-pay | Admitting: Internal Medicine

## 2019-11-18 DIAGNOSIS — G4730 Sleep apnea, unspecified: Secondary | ICD-10-CM

## 2019-11-25 ENCOUNTER — Encounter: Payer: Self-pay | Admitting: Surgical

## 2019-11-25 MED ORDER — DOXYCYCLINE HYCLATE 100 MG PO TABS: 100.0000 mg | ORAL_TABLET | Freq: Two times a day (BID) | ORAL | 0 refills | Status: AC

## 2019-11-25 NOTE — Progress Notes (Signed)
Patient is a 59 year old female who called the on-call provider service this a.m. stating that she has felt warm and has noticed some redness of her breast over the last few days. She reports she previously was taking doxycycline for a suspected infection of the left JP drain site and was worried it had returned. She does not report any measurable fevers, nausea, vomiting, purulent drainage or other concerns.   Patient has an appo intment in six days for reevaluation, she does not feel comfortable waiting until then without an antibiotic.Prescription for six days of boxes at the pharmacy, recommend calling your symptoms change or worse.

## 2019-11-29 NOTE — Progress Notes (Signed)
Patient is a 59 yr-old female here for follow-up after undergoing a left radical mastectomy with Dr. Marlou Starks and left breast reconstruction with placement of tissue expander and flex HD with Dr. Marla Roe on 10/19/19. Patient called with concerns for redness and was given doxy on 10/8 for 6 days.  ~ 6 weeks PO Left breast incision is healing very nicely, C/D/I with no sign of drainage.  Left breast, especially inferior to incision,  is red and warm to the touch.  Medial aspect of the right breast is also red.  Patient denies fever/chills and vomiting.  Reports some nausea after she takes the doxycycline.  She does not have much laxity of the skin.  Negative for fluid wave; no palpable seroma.  We did not place injectable saline in the Expander: Left 0 cc for a total of 300 / 535 cc  Sent Cipro x10 days to pharmacy. Recommend probiotic. May continue to do circular massage to left breast skin and apply vaseline. Follow up in 2 weeks. Return sooner if condition worsens. Call office with any questions/concerns.    Pictures were obtained of the patient and placed in the chart with the patient's or guardian's permission.  The [place] was signed into law in 2016 which includes the topic of electronic health records.  This provides immediate access to information in MyChart.  This includes consultation notes, operative notes, office notes, lab results and pathology reports.  If you have any questions about what you read please let us know at your next visit or call us at the office.  We are right here with you.

## 2019-11-30 ENCOUNTER — Other Ambulatory Visit: Payer: Self-pay

## 2019-11-30 ENCOUNTER — Ambulatory Visit (INDEPENDENT_AMBULATORY_CARE_PROVIDER_SITE_OTHER): Payer: 59 | Admitting: Plastic Surgery

## 2019-11-30 ENCOUNTER — Encounter: Payer: Self-pay | Admitting: Plastic Surgery

## 2019-11-30 VITALS — BP 149/84 | HR 102 | Temp 99.8°F

## 2019-11-30 DIAGNOSIS — Z9012 Acquired absence of left breast and nipple: Secondary | ICD-10-CM

## 2019-11-30 MED ORDER — CIPROFLOXACIN HCL 500 MG PO TABS: 500.0000 mg | ORAL_TABLET | Freq: Two times a day (BID) | ORAL | 0 refills | Status: AC

## 2019-11-30 NOTE — Progress Notes (Signed)
Pharmacist Chemotherapy Monitoring - Initial Assessment    Anticipated start date: 12/06/19   Regimen:  . Are orders appropriate based on the patient's diagnosis, regimen, and cycle? Yes . Does the plan date match the patient's scheduled date? Yes . Is the sequencing of drugs appropriate? Yes . Are the premedications appropriate for the patient's regimen? Yes . Prior Authorization for treatment is: Pending o If applicable, is the correct biosimilar selected based on the patient's insurance? not applicable  Organ Function and Labs: [name] Are dose adjustments needed based on the patient's renal function, hepatic function, or hematologic function? No . Are appropriate labs ordered prior to the start of patient's treatment? Yes . Other organ system assessment, if indicated: N/A . The following baseline labs, if indicated, have been ordered: N/A  Dose Assessment: . Are the drug doses appropriate? Yes . Are the following correct: o Drug concentrations Yes o IV fluid compatible with drug Yes o Administration routes Yes o Timing of therapy Yes . If applicable, does the patient have documented access for treatment and/or plans for port-a-cath placement? not applicable . If applicable, have lifetime cumulative doses been properly documented and assessed? not applicable Lifetime Dose Tracking  No doses have been documented on this patient for the following tracked chemicals: Doxorubicin, Epirubicin, Idarubicin, Daunorubicin, Mitoxantrone, Bleomycin, Oxaliplatin, Carboplatin, Liposomal Doxorubicin  o   Toxicity Monitoring/Prevention: . The patient has the following take home antiemetics prescribed: Ondansetron, Prochlorperazine, Dexamethasone and Lorazepam . The patient has the following take home medications prescribed: N/A . Medication allergies and previous infusion related reactions, if applicable, have been reviewed and addressed. Yes . The patient's current medication list has been assessed  for drug-drug interactions with their chemotherapy regimen. no significant drug-drug interactions were identified on review.  Order Review: . Are the treatment plan orders signed? No . Is the patient scheduled to see a provider prior to their treatment? Yes  I verify that I have reviewed each item in the above checklist and answered each question accordingly.  Gaynelle Pastrana K 11/30/2019 8:44 AM

## 2019-12-02 ENCOUNTER — Telehealth: Payer: Self-pay | Admitting: *Deleted

## 2019-12-02 NOTE — Telephone Encounter (Signed)
Received call from patient stating she still has a breast infection that she is on antibiotics for and was asking if she should come to her appointment on 10/19 to start chemo. She has been seeing Dr. Eusebio Friendly office concerning this issue.  Informed her that she should still come to her appointment so Dr. Jana Hakim could evaluate this as well and chemo can always be cancelled if needed. Patient verbalized understanding.

## 2019-12-05 MED FILL — Dexamethasone Sodium Phosphate Inj 100 MG/10ML: INTRAMUSCULAR | Qty: 1 | Status: AC
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
## 2019-12-06 ENCOUNTER — Encounter: Payer: Self-pay | Admitting: *Deleted

## 2019-12-06 ENCOUNTER — Inpatient Hospital Stay: Payer: 59 | Attending: Oncology | Admitting: Oncology

## 2019-12-06 ENCOUNTER — Inpatient Hospital Stay: Payer: 59

## 2019-12-06 ENCOUNTER — Other Ambulatory Visit: Payer: Self-pay

## 2019-12-06 VITALS — BP 132/64 | HR 94 | Temp 97.7°F | Resp 15 | Ht 64.0 in | Wt 232.7 lb

## 2019-12-06 DIAGNOSIS — Z170 Estrogen receptor positive status [ER+]: Secondary | ICD-10-CM

## 2019-12-06 DIAGNOSIS — C50412 Malignant neoplasm of upper-outer quadrant of left female breast: Secondary | ICD-10-CM

## 2019-12-06 DIAGNOSIS — C773 Secondary and unspecified malignant neoplasm of axilla and upper limb lymph nodes: Secondary | ICD-10-CM | POA: Insufficient documentation

## 2019-12-06 DIAGNOSIS — N61 Mastitis without abscess: Secondary | ICD-10-CM | POA: Insufficient documentation

## 2019-12-06 LAB — CBC WITH DIFFERENTIAL (CANCER CENTER ONLY)
Abs Immature Granulocytes: 0.03 10*3/uL (ref 0.00–0.07)
Basophils Absolute: 0 10*3/uL (ref 0.0–0.1)
Basophils Relative: 0 %
Eosinophils Absolute: 0.1 10*3/uL (ref 0.0–0.5)
Eosinophils Relative: 1 %
HCT: 33.3 % — ABNORMAL LOW (ref 36.0–46.0)
Hemoglobin: 10.5 g/dL — ABNORMAL LOW (ref 12.0–15.0)
Immature Granulocytes: 0 %
Lymphocytes Relative: 26 %
Lymphs Abs: 2 10*3/uL (ref 0.7–4.0)
MCH: 27.3 pg (ref 26.0–34.0)
MCHC: 31.5 g/dL (ref 30.0–36.0)
MCV: 86.7 fL (ref 80.0–100.0)
Monocytes Absolute: 0.6 10*3/uL (ref 0.1–1.0)
Monocytes Relative: 8 %
Neutro Abs: 4.8 10*3/uL (ref 1.7–7.7)
Neutrophils Relative %: 65 %
Platelet Count: 402 10*3/uL — ABNORMAL HIGH (ref 150–400)
RBC: 3.84 MIL/uL — ABNORMAL LOW (ref 3.87–5.11)
RDW: 13.6 % (ref 11.5–15.5)
WBC Count: 7.5 10*3/uL (ref 4.0–10.5)
nRBC: 0 % (ref 0.0–0.2)

## 2019-12-06 LAB — CMP (CANCER CENTER ONLY)
ALT: 36 U/L (ref 0–44)
AST: 13 U/L — ABNORMAL LOW (ref 15–41)
Albumin: 2.6 g/dL — ABNORMAL LOW (ref 3.5–5.0)
Alkaline Phosphatase: 126 U/L (ref 38–126)
Anion gap: 11 (ref 5–15)
BUN: 10 mg/dL (ref 6–20)
CO2: 25 mmol/L (ref 22–32)
Calcium: 9.5 mg/dL (ref 8.9–10.3)
Chloride: 102 mmol/L (ref 98–111)
Creatinine: 0.92 mg/dL (ref 0.44–1.00)
GFR, Estimated: >60 mL/min (ref >60)
Glucose, Bld: 122 mg/dL — ABNORMAL HIGH (ref 70–99)
Potassium: 3.6 mmol/L (ref 3.5–5.1)
Sodium: 138 mmol/L (ref 135–145)
Total Bilirubin: 0.4 mg/dL (ref 0.3–1.2)
Total Protein: 8.2 g/dL — ABNORMAL HIGH (ref 6.5–8.1)

## 2019-12-06 MED ORDER — VENLAFAXINE HCL ER 37.5 MG PO CP24: 37.5000 mg | ORAL_CAPSULE | Freq: Every day | ORAL | 4 refills | Status: DC

## 2019-12-06 MED ORDER — AMOXICILLIN-POT CLAVULANATE 875-125 MG PO TABS: 1.0000 | ORAL_TABLET | Freq: Two times a day (BID) | ORAL | 1 refills | Status: DC

## 2019-12-06 NOTE — Progress Notes (Signed)
Per Dr Jana Hakim, no tx today. Move up one week. [place], RN and [employer], RN made aware.

## 2019-12-07 ENCOUNTER — Telehealth: Payer: Self-pay | Admitting: Internal Medicine

## 2019-12-07 ENCOUNTER — Encounter: Payer: Self-pay | Admitting: Licensed Clinical Social Worker

## 2019-12-07 ENCOUNTER — Encounter: Payer: Self-pay | Admitting: Oncology

## 2019-12-07 NOTE — Progress Notes (Signed)
Patient is a very sweet 59 year old female here for follow-up after undergoing a left radical mastectomy with Dr. Marlou Starks and left breast reconstruction with placement of tissue expander and Flex HD with Dr. Marla Roe on 10/19/2019.  Patient called with concerns for redness and been given doxy on 10/8 for 6 days.  At her last follow-up visit on 10/13 the left breast, especially inferior to the incision, was red and warm to the touch.  Medial aspect of the right breast was also red.  Patient reported some nausea after taking the doxycycline.  Patient was instructed to complete the course of doxycycline and then start Cipro for 10 days.  On 10/20 her oncologist was going to start her chemo but was concerned with the infection of infection in the left breast and medial right breast. He added augmentin to her cipro and moved her treatment back a week to 11/26.    ~ 3 weeks PO Patient is doing okay today.  Incision is healing well, c/d/i. No signs of drainage. The erythema on the left breast and medial aspect of the right breast has significantly improved, but not completely resolved since her last visit on 10/13.  She denies fever/chills, nausea/vomiting she has continued area of firmness on the medial aspect of the mastectomy flap.  She reports the tissue expander is uncomfortable.    At this time she feels like she might like to consider having the tissue expander removed.  We briefly discussed reconstruction options from removal of the tissue expander, expanding slightly and reducing the other side to match, to full expansion.  Recommend she discuss with Dr. Marla Roe to fully understand all her available options before making a final decision.  Reinforced to patient that there is no incorrect decision.  That it is a very personal decision and I would like her to have all of the information she needs to make a good informed decision she can feel comfortable about. Patient is in agreement with plan.   Pictures  were obtained of the patient and placed in the chart with the patient's or guardian's permission.  We placed injectable saline in the Expander using a sterile technique: Left: 0 cc for a total of 300 / 535 cc  Sent additional cipro to pharmacy to begin when she runs out of current Rx. Recommend vaseline and circular massage to area of firmness. Follow up scheduled next week with me to check on infection and in 2 weeks with Dr. Marla Roe to discuss options.

## 2019-12-07 NOTE — Telephone Encounter (Signed)
   Lauren Garrison DOB: 07-20-1960  MRN: [id number]   RIDER WAIVER AND RELEASE OF LIABILITY  [name], Lauren Garrison is pleased to partner with third parties to provide Lauren Garrison patients or other authorized individuals the option of convenient, on-demand ground transportation services (the [name]") through use of the technology service that enables users to request on-demand ground transportation from independent third-party providers.  By opting to use and accept these [name], I, the undersigned, hereby agree on behalf of myself, and on behalf of any minor child using the [name] for whom I am the parent or legal guardian, as follows:  1. [name] provided to me are provided by independent third-party transportation providers who are not [employer] or employees and who are unaffiliated with [employer]. 2. Lauren Garrison is neither a transportation carrier nor a common or public carrier. 3. Lauren Garrison has no control over the quality or safety of the transportation that occurs as a result of the [name]. 4. [name] cannot guarantee that any third-party transportation provider will complete any arranged transportation service. 5. [name] makes no representation, warranty, or guarantee regarding the reliability, timeliness, quality, safety, suitability, or availability of any of the Transport Services or that they will be error free. 6. I fully understand that traveling by vehicle involves risks and dangers of serious bodily injury, including permanent disability, paralysis, and death. I agree, on behalf of myself and on behalf of any minor child using the Transport Services for whom I am the parent or legal guardian, that the entire risk arising out of my use of the [name] remains solely with me, to the maximum extent permitted under applicable law. 7. The  [name] are provided "as is" and "as available." [name] disclaims all representations and warranties, express, implied or statutory, not expressly set out in these terms, including the implied warranties of merchantability and fitness for a particular purpose. 8. I hereby waive and release Lauren Garrison, its agents, employees, officers, directors, representatives, insurers, attorneys, assigns, successors, subsidiaries, and affiliates from any and all past, present, or future claims, demands, liabilities, actions, causes of action, or suits of any kind directly or indirectly arising from acceptance and use of the [name]. 9. I further waive and release Lauren Garrison and its affiliates from all present and future liability and responsibility for any injury or death to persons or damages to property caused by or related to the use of the [name]. 10. I have read this Waiver and Release of Liability, and I understand the terms used in it and their legal significance. This Waiver is freely and voluntarily given with the understanding that my right (as well as the right of any minor child for whom I am the parent or legal guardian using the [name]) to legal recourse against [name] in connection with the [name] is knowingly surrendered in return for use of these services.   I attest that I read the consent document to Lauren Garrison, gave Lauren Garrison the opportunity to ask questions and answered the questions asked (if any). I affirm that Lauren Garrison then provided consent for she's participation in this program.     Lauren Garrison

## 2019-12-07 NOTE — Progress Notes (Signed)
Called patient referred by  Sharyn Lull in [facility] to introduce myself as Arboriculturist and to offer available resources.  Discussed one-time $1000 Radio broadcast assistant to assist with personal expenses while going through treatment.  Gave her my contact information if interested in applying and for any additional financial questions or concerns.
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
## 2019-12-08 ENCOUNTER — Other Ambulatory Visit: Payer: Self-pay

## 2019-12-08 ENCOUNTER — Ambulatory Visit (INDEPENDENT_AMBULATORY_CARE_PROVIDER_SITE_OTHER): Payer: 59 | Admitting: Plastic Surgery

## 2019-12-08 ENCOUNTER — Encounter: Payer: Self-pay | Admitting: Plastic Surgery

## 2019-12-08 VITALS — BP 147/80 | HR 94 | Temp 98.8°F

## 2019-12-08 DIAGNOSIS — Z9012 Acquired absence of left breast and nipple: Secondary | ICD-10-CM

## 2019-12-08 MED ORDER — CIPROFLOXACIN HCL 500 MG PO TABS: 500.0000 mg | ORAL_TABLET | Freq: Two times a day (BID) | ORAL | 0 refills | Status: AC

## 2019-12-08 NOTE — Progress Notes (Signed)
Pharmacist Chemotherapy Monitoring - Initial Assessment    Anticipated start date: 12/14/2019   Regimen:  . Are orders appropriate based on the patient's diagnosis, regimen, and cycle? Yes . Does the plan date match the patient's scheduled date? Yes . Is the sequencing of drugs appropriate? Yes . Are the premedications appropriate for the patient's regimen? Yes . Prior Authorization for treatment is: Approved o If applicable, is the correct biosimilar selected based on the patient's insurance? not applicable  Organ Function and Labs: [name] Are dose adjustments needed based on the patient's renal function, hepatic function, or hematologic function? No . Are appropriate labs ordered prior to the start of patient's treatment? Yes . Other organ system assessment, if indicated: N/A . The following baseline labs, if indicated, have been ordered: N/A  Dose Assessment: . Are the drug doses appropriate? Yes . Are the following correct: o Drug concentrations Yes o IV fluid compatible with drug Yes o Administration routes Yes o Timing of therapy Yes . If applicable, does the patient have documented access for treatment and/or plans for port-a-cath placement? no . If applicable, have lifetime cumulative doses been properly documented and assessed? not applicable Lifetime Dose Tracking  No doses have been documented on this patient for the following tracked chemicals: Doxorubicin, Epirubicin, Idarubicin, Daunorubicin, Mitoxantrone, Bleomycin, Oxaliplatin, Carboplatin, Liposomal Doxorubicin  o   Toxicity Monitoring/Prevention: . The patient has the following take home antiemetics prescribed: Prochlorperazine . The patient has the following take home medications prescribed: N/A . Medication allergies and previous infusion related reactions, if applicable, have been reviewed and addressed. Yes . The patient's current medication list has been assessed for drug-drug interactions with their chemotherapy  regimen. Significant drug-drug interactions were identified on review, penicillins (Augmentin) and ciprofloxacin may increase the concentrations of methotrexate and result in increased toxicity. No recommendation for dose adjustment now as these are temporary antibiotics but would recommend monitoring closely for toxicity if antibiotics are continued with methotrexate.   Order Review: . Are the treatment plan orders signed? Yes . Is the patient scheduled to see a provider prior to their treatment? Yes  I verify that I have reviewed each item in the above checklist and answered each question accordingly.  Eddie Candle, PharmD PGY-2 Hematology/Oncology Pharmacy Resident   12/08/2019 4:14 PM

## 2019-12-09 ENCOUNTER — Telehealth: Payer: Self-pay

## 2019-12-09 NOTE — Telephone Encounter (Signed)
error 

## 2019-12-09 NOTE — Telephone Encounter (Signed)
Patient has appointment scheduled with Dr. Marla Roe on 12/20/2019 to discuss options for expanders. However, she states that the appointment is not necessary as she has made up her mind and would like to proceed with expander removal surgery as soon as possible.

## 2019-12-09 NOTE — Telephone Encounter (Signed)
Advised patient Dr. Marla Roe would like to set up a televisit prior to surgery to remove expander.

## 2019-12-12 ENCOUNTER — Encounter: Payer: Self-pay | Admitting: *Deleted

## 2019-12-12 NOTE — Progress Notes (Deleted)
Patient is a 59 year old female here for follow-up after undergoing a left radical mastectomy with Dr. Marlou Starks and left breast reconstruction with placement of tissue expander and Flex HD with Dr. Marla Roe on 10/19/2019.  Patient called with concerns for redness and was given doxy on 10/8 for 6 days.  At her visit on 10/13, the left breast inferior to the incision was red and warm to the touch along with the medial aspect of the right breast.  Patient reported some nausea after taking the doxycycline.  Patient was instructed to complete the course of doxycycline and then start Cipro for 10 days.  On 10/20 her oncologist was going to start her chemo but was concerned with the infection in the left breast and medial right breast.  He added Augmentin to her Cipro and moved her treatment back a week to 10/26.  At her visit on 10/21 the erythema on the left breast and medial aspect of the right breast had significantly improved, but was not completely resolved.  She was given additional days of Cipro.  She she expressed a desire to consider having the tissue expander removed.  We briefly discussed reconstruction options and recommended she set up an appointment with Dr. Marla Roe to make sure she had of all available information before making a final decision.  ~ 8 weeks PO  We placed injectable saline in the Expander using a sterile technique: Left: *** cc for a total of 300 / 535 cc

## 2019-12-13 ENCOUNTER — Other Ambulatory Visit: Payer: Self-pay | Admitting: Nurse Practitioner

## 2019-12-13 DIAGNOSIS — C50412 Malignant neoplasm of upper-outer quadrant of left female breast: Secondary | ICD-10-CM

## 2019-12-13 MED FILL — Dexamethasone Sodium Phosphate Inj 100 MG/10ML: INTRAMUSCULAR | Qty: 1 | Status: AC

## 2019-12-14 ENCOUNTER — Inpatient Hospital Stay: Payer: 59

## 2019-12-14 ENCOUNTER — Other Ambulatory Visit: Payer: Self-pay

## 2019-12-14 ENCOUNTER — Inpatient Hospital Stay (HOSPITAL_BASED_OUTPATIENT_CLINIC_OR_DEPARTMENT_OTHER): Payer: 59 | Admitting: Medical

## 2019-12-14 ENCOUNTER — Encounter: Payer: Self-pay | Admitting: *Deleted

## 2019-12-14 ENCOUNTER — Encounter (HOSPITAL_BASED_OUTPATIENT_CLINIC_OR_DEPARTMENT_OTHER): Payer: Self-pay | Admitting: Plastic Surgery

## 2019-12-14 VITALS — BP 132/73 | HR 85 | Temp 98.8°F | Resp 18 | Ht 65.0 in | Wt 233.6 lb

## 2019-12-14 DIAGNOSIS — C50412 Malignant neoplasm of upper-outer quadrant of left female breast: Secondary | ICD-10-CM | POA: Diagnosis not present

## 2019-12-14 DIAGNOSIS — Z170 Estrogen receptor positive status [ER+]: Secondary | ICD-10-CM

## 2019-12-14 DIAGNOSIS — L03313 Cellulitis of chest wall: Secondary | ICD-10-CM

## 2019-12-14 DIAGNOSIS — N61 Mastitis without abscess: Secondary | ICD-10-CM | POA: Diagnosis not present

## 2019-12-14 LAB — CMP (CANCER CENTER ONLY)
ALT: 23 U/L (ref 0–44)
AST: 17 U/L (ref 15–41)
Albumin: 3.1 g/dL — ABNORMAL LOW (ref 3.5–5.0)
Alkaline Phosphatase: 128 U/L — ABNORMAL HIGH (ref 38–126)
Anion gap: 12 (ref 5–15)
BUN: 9 mg/dL (ref 6–20)
CO2: 25 mmol/L (ref 22–32)
Calcium: 9.6 mg/dL (ref 8.9–10.3)
Chloride: 103 mmol/L (ref 98–111)
Creatinine: 0.83 mg/dL (ref 0.44–1.00)
GFR, Estimated: >60 mL/min (ref >60)
Glucose, Bld: 105 mg/dL — ABNORMAL HIGH (ref 70–99)
Potassium: 4.2 mmol/L (ref 3.5–5.1)
Sodium: 140 mmol/L (ref 135–145)
Total Bilirubin: 0.4 mg/dL (ref 0.3–1.2)
Total Protein: 8.9 g/dL — ABNORMAL HIGH (ref 6.5–8.1)

## 2019-12-14 LAB — CBC WITH DIFFERENTIAL (CANCER CENTER ONLY)
Abs Immature Granulocytes: 0.02 10*3/uL (ref 0.00–0.07)
Basophils Absolute: 0 10*3/uL (ref 0.0–0.1)
Basophils Relative: 1 %
Eosinophils Absolute: 0.1 10*3/uL (ref 0.0–0.5)
Eosinophils Relative: 2 %
HCT: 39.6 % (ref 36.0–46.0)
Hemoglobin: 12.5 g/dL (ref 12.0–15.0)
Immature Granulocytes: 0 %
Lymphocytes Relative: 29 %
Lymphs Abs: 1.6 10*3/uL (ref 0.7–4.0)
MCH: 27 pg (ref 26.0–34.0)
MCHC: 31.6 g/dL (ref 30.0–36.0)
MCV: 85.5 fL (ref 80.0–100.0)
Monocytes Absolute: 0.4 10*3/uL (ref 0.1–1.0)
Monocytes Relative: 8 %
Neutro Abs: 3.3 10*3/uL (ref 1.7–7.7)
Neutrophils Relative %: 60 %
Platelet Count: 179 10*3/uL (ref 150–400)
RBC: 4.63 MIL/uL (ref 3.87–5.11)
RDW: 14.1 % (ref 11.5–15.5)
WBC Count: 5.5 10*3/uL (ref 4.0–10.5)
nRBC: 0 % (ref 0.0–0.2)

## 2019-12-14 MED ORDER — SODIUM CHLORIDE 0.9 % IV SOLN
Freq: Once | INTRAVENOUS | Status: AC
  Filled 2019-12-14: qty 250

## 2019-12-14 MED ORDER — DIPHENHYDRAMINE HCL 50 MG/ML IJ SOLN: 25.0000 mg | INTRAMUSCULAR | Status: DC

## 2019-12-14 MED ORDER — DEXTROSE 5 % IV SOLN: 2.0000 g | INTRAVENOUS | Status: DC

## 2019-12-14 MED ORDER — VANCOMYCIN HCL 1000 MG IV SOLR: 1000.0000 mg | INTRAVENOUS | Status: DC

## 2019-12-14 MED ORDER — DEXTROSE 5 % IV SOLN
2.0000 g | Freq: Once | INTRAVENOUS | Status: AC
  Administered 2019-12-14: 2 g via INTRAVENOUS
  Filled 2019-12-14: qty 20

## 2019-12-14 MED ORDER — DIPHENHYDRAMINE HCL 50 MG/ML IJ SOLN
INTRAMUSCULAR | Status: AC
  Filled 2019-12-14: qty 1

## 2019-12-14 MED ORDER — DIPHENHYDRAMINE HCL 50 MG/ML IJ SOLN
25.0000 mg | Freq: Once | INTRAMUSCULAR | Status: AC
  Administered 2019-12-14: 25 mg via INTRAVENOUS

## 2019-12-14 MED ORDER — VANCOMYCIN HCL 1000 MG IV SOLR
1000.0000 mg | Freq: Once | INTRAVENOUS | Status: AC
  Administered 2019-12-14: 1000 mg via INTRAVENOUS
  Filled 2019-12-14: qty 1000

## 2019-12-14 NOTE — Patient Instructions (Signed)

## 2019-12-14 NOTE — Progress Notes (Signed)
Pt tolerated Vancomycin until end of infusion when she reported itching on face/scalp.  Denies any other symptoms.  No rash present.  Speaking in full sentences.  Given IV benadryl, tolerated well, symptoms resolved.  PA Lucianne Lei aware and adding orders for benadryl admin during future IV abx administrations.

## 2019-12-15 ENCOUNTER — Ambulatory Visit: Payer: 59 | Admitting: Plastic Surgery

## 2019-12-15 ENCOUNTER — Other Ambulatory Visit: Payer: Self-pay | Admitting: Emergency Medicine

## 2019-12-15 ENCOUNTER — Inpatient Hospital Stay: Payer: 59

## 2019-12-15 ENCOUNTER — Other Ambulatory Visit: Payer: Self-pay

## 2019-12-15 VITALS — BP 147/64 | HR 87 | Temp 98.2°F | Resp 20

## 2019-12-15 DIAGNOSIS — L03313 Cellulitis of chest wall: Secondary | ICD-10-CM

## 2019-12-15 DIAGNOSIS — N61 Mastitis without abscess: Secondary | ICD-10-CM | POA: Diagnosis not present

## 2019-12-15 DIAGNOSIS — Z9012 Acquired absence of left breast and nipple: Secondary | ICD-10-CM

## 2019-12-15 MED ORDER — DIPHENHYDRAMINE HCL 50 MG/ML IJ SOLN
INTRAMUSCULAR | Status: AC
  Filled 2019-12-15: qty 1

## 2019-12-15 MED ORDER — DEXTROSE 5 % IV SOLN
2.0000 g | Freq: Once | INTRAVENOUS | Status: AC
  Administered 2019-12-15: 2 g via INTRAVENOUS
  Filled 2019-12-15: qty 20

## 2019-12-15 MED ORDER — DIPHENHYDRAMINE HCL 50 MG/ML IJ SOLN
25.0000 mg | Freq: Once | INTRAMUSCULAR | Status: AC
  Administered 2019-12-15: 25 mg via INTRAVENOUS

## 2019-12-15 MED ORDER — SODIUM CHLORIDE 0.9 % IV SOLN
INTRAVENOUS | Status: DC
  Filled 2019-12-15: qty 250

## 2019-12-15 MED ORDER — VANCOMYCIN HCL 1000 MG IV SOLR
1000.0000 mg | Freq: Once | INTRAVENOUS | Status: AC
  Administered 2019-12-15: 1000 mg via INTRAVENOUS
  Filled 2019-12-15: qty 1000

## 2019-12-16 ENCOUNTER — Other Ambulatory Visit: Payer: Self-pay

## 2019-12-16 ENCOUNTER — Inpatient Hospital Stay: Payer: 59

## 2019-12-16 ENCOUNTER — Inpatient Hospital Stay (HOSPITAL_BASED_OUTPATIENT_CLINIC_OR_DEPARTMENT_OTHER): Payer: 59 | Admitting: Medical

## 2019-12-16 VITALS — BP 149/90 | HR 76 | Temp 98.5°F | Resp 18

## 2019-12-16 DIAGNOSIS — Z170 Estrogen receptor positive status [ER+]: Secondary | ICD-10-CM

## 2019-12-16 DIAGNOSIS — L03313 Cellulitis of chest wall: Secondary | ICD-10-CM

## 2019-12-16 DIAGNOSIS — N61 Mastitis without abscess: Secondary | ICD-10-CM | POA: Diagnosis not present

## 2019-12-16 DIAGNOSIS — C50412 Malignant neoplasm of upper-outer quadrant of left female breast: Secondary | ICD-10-CM

## 2019-12-16 MED ORDER — SODIUM CHLORIDE 0.9 % IV SOLN
INTRAVENOUS | Status: DC
  Filled 2019-12-16: qty 250

## 2019-12-16 MED ORDER — VANCOMYCIN HCL 1000 MG IV SOLR
1000.0000 mg | Freq: Once | INTRAVENOUS | Status: AC
  Administered 2019-12-17: 1000 mg via INTRAVENOUS
  Filled 2019-12-16: qty 1000

## 2019-12-16 MED ORDER — DEXAMETHASONE SODIUM PHOSPHATE 10 MG/ML IJ SOLN
10.0000 mg | Freq: Once | INTRAMUSCULAR | Status: AC
  Administered 2019-12-16: 10 mg via INTRAVENOUS

## 2019-12-16 MED ORDER — DIPHENHYDRAMINE HCL 50 MG/ML IJ SOLN
INTRAMUSCULAR | Status: AC
  Filled 2019-12-16: qty 1

## 2019-12-16 MED ORDER — DIPHENHYDRAMINE HCL 50 MG/ML IJ SOLN
25.0000 mg | Freq: Once | INTRAMUSCULAR | Status: AC
  Administered 2019-12-16: 25 mg via INTRAVENOUS

## 2019-12-16 MED ORDER — VANCOMYCIN HCL 1000 MG IV SOLR
1000.0000 mg | Freq: Once | INTRAVENOUS | Status: AC
  Administered 2019-12-16: 1000 mg via INTRAVENOUS
  Filled 2019-12-16: qty 1000

## 2019-12-16 MED ORDER — DEXAMETHASONE SODIUM PHOSPHATE 10 MG/ML IJ SOLN
INTRAMUSCULAR | Status: AC
  Filled 2019-12-16: qty 1

## 2019-12-16 MED ORDER — DEXTROSE 5 % IV SOLN
2.0000 g | Freq: Once | INTRAVENOUS | Status: AC
  Administered 2019-12-17: 2 g via INTRAVENOUS
  Filled 2019-12-16: qty 20

## 2019-12-16 MED ORDER — DEXTROSE 5 % IV SOLN
2.0000 g | INTRAVENOUS | Status: DC
  Administered 2019-12-16: 2 g via INTRAVENOUS
  Filled 2019-12-16: qty 20

## 2019-12-16 NOTE — Patient Instructions (Signed)
Ceftriaxone Injection What is this medicine? CEFTRIAXONE (sef try AX one) is a cephalosporin antibiotic. It treats some infections caused by bacteria. It will not work for colds, the flu, or other viruses. This medicine may be used for other purposes; ask your health care provider or pharmacist if you have questions. COMMON BRAND NAME(S): Ceftrisol Plus, Rocephin What should I tell my health care provider before I take this medicine? They need to know if you have any of these conditions:  any chronic illness  bowel disease, like colitis  both kidney and liver disease  high bilirubin level in newborn patients  an unusual or allergic reaction to ceftriaxone, other cephalosporin or penicillin antibiotics, foods, dyes, or preservatives  pregnant or trying to get pregnant  breast-feeding How should I use this medicine? This drug is injected into a muscle or a vein. It is usually given by a health care provider in a hospital or clinic setting. If you get this drug at home, you will be taught how to prepare and give it. Use exactly as directed. Take it as directed on the prescription label at the same time every day. Keep taking it unless your health care provider tells you to stop. It is important that you put your used needles and syringes in a special sharps container. Do not put them in a trash can. If you do not have a sharps container, call your pharmacist or health care provider to get one. Talk to your health care provider about the use of this drug in children. While it may be prescribed for children as young as newborns for selected conditions, precautions do apply. Overdosage: If you think you have taken too much of this medicine contact a poison control center or emergency room at once. NOTE: This medicine is only for you. Do not share this medicine with others. What if I miss a dose? It is important not to miss your dose. Call your health care provider if you are unable to keep an  appointment. If you give yourself this drug at home and you miss a dose, take it as soon as you can. If it is almost time for your next dose, take only that dose. Do not take double or extra doses. What may interact with this medicine? Do not take this medicine with any of the following medications:  intravenous calcium This medicine may also interact with the following medications:  birth control pills This list may not describe all possible interactions. Give your health care provider a list of all the medicines, herbs, non-prescription drugs, or dietary supplements you use. Also tell them if you smoke, drink alcohol, or use illegal drugs. Some items may interact with your medicine. What should I watch for while using this medicine? Tell your doctor or health care provider if your symptoms do not improve or if they get worse. This medicine may cause serious skin reactions. They can happen weeks to months after starting the medicine. Contact your health care provider right away if you notice fevers or flu-like symptoms with a rash. The rash may be red or purple and then turn into blisters or peeling of the skin. Or, you might notice a red rash with swelling of the face, lips or lymph nodes in your neck or under your arms. Do not treat diarrhea with over the counter products. Contact your doctor if you have diarrhea that lasts more than 2 days or if it is severe and watery. If you are being treated for   a sexually transmitted disease, avoid sexual contact until you have finished your treatment. Having sex can infect your sexual partner. Calcium may bind to this medicine and cause lung or kidney problems. Avoid calcium products while taking this medicine and for 48 hours after taking the last dose of this medicine. What side effects may I notice from receiving this medicine? Side effects that you should report to your doctor or health care professional as soon as possible:  allergic reactions like  skin rash, itching or hives, swelling of the face, lips, or tongue  breathing problems  fever, chills  irregular heartbeat  pain when passing urine  redness, blistering, peeling, or loosening of the skin, including inside the mouth  seizures  stomach pain, cramps  unusual bleeding, bruising  unusually weak or tired Side effects that usually do not require medical attention (report to your doctor or health care professional if they continue or are bothersome):  diarrhea  dizzy, drowsy  headache  nausea, vomiting  pain, swelling, irritation where injected  stomach upset  sweating This list may not describe all possible side effects. Call your doctor for medical advice about side effects. You may report side effects to FDA at 1-800-FDA-1088. Where should I keep my medicine? Keep out of the reach of children and pets. You will be instructed on how to store this drug. Protect from light. Throw away any unused drug after the expiration date. NOTE: This sheet is a summary. It may not cover all possible information. If you have questions about this medicine, talk to your doctor, pharmacist, or health care provider.  2020 Elsevier/Gold Standard (2018-09-09 18:29:21)  Vancomycin injection What is this medicine? VANCOMYCIN Lucianne Lei koe MYE sin) is a glycopeptide antibiotic. It is used to treat certain kinds of bacterial infections. It will not work for colds, flu, or other viral infections. This medicine may be used for other purposes; ask your health care provider or pharmacist if you have questions. COMMON BRAND NAME(S): Glo Herring What should I tell my health care provider before I take this medicine? They need to know if you have any of these conditions:  dehydration  hearing loss  kidney disease  other chronic illness  an unusual or allergic reaction to vancomycin, other medicines, foods, dyes, or preservatives  pregnant or trying to get  pregnant  breast-feeding How should I use this medicine? This medicine is infused into a vein. It is usually given by a health care provider in a hospital or clinic. If you receive this medicine at home, you will receive special instructions. Take your medicine at regular intervals. Do not take your medicine more often than directed. Take all of your medicine as directed even if you think you are better. Do not skip doses or stop your medicine early. It is important that you put your used needles and syringes in a special sharps container. Do not put them in a trash can. If you do not have a sharps container, call your pharmacist or healthcare provider to get one. Talk to your pediatrician regarding the use of this medicine in children. While this drug may be prescribed for even very young infants for selected conditions, precautions do apply. Overdosage: If you think you have taken too much of this medicine contact a poison control center or emergency room at once. NOTE: This medicine is only for you. Do not share this medicine with others. What if I miss a dose? If you miss a dose, take it as soon as  you can. If it is almost time for your next dose, take only that dose. Do not take double or extra doses. What may interact with this medicine?  amphotericin B  anesthetics  bacitracin  birth control pills  cisplatin  colistin  diuretics  other aminoglycoside antibiotics  polymyxin B This list may not describe all possible interactions. Give your health care provider a list of all the medicines, herbs, non-prescription drugs, or dietary supplements you use. Also tell them if you smoke, drink alcohol, or use illegal drugs. Some items may interact with your medicine. What should I watch for while using this medicine? Tell your doctor or health care provider if your symptoms do not improve or if you get new symptoms. Your condition and lab work will be monitored while you are taking this  medicine. Do not treat diarrhea with over the counter products. Contact your doctor if you have diarrhea that lasts more than 2 days or if it is severe and watery. This medicine may cause serious skin reactions. They can happen weeks to months after starting the medicine. Contact your health care provider right away if you notice fevers or flu-like symptoms with a rash. The rash may be red or purple and then turn into blisters or peeling of the skin. Or, you might notice a red rash with swelling of the face, lips or lymph nodes in your neck or under your arms. What side effects may I notice from receiving this medicine? Side effects that you should report to your doctor or health care professional as soon as possible:  allergic reactions like skin rash, itching or hives, swelling of the face, lips, or tongue  breathing difficulty, wheezing  change in amount, color of urine  change in hearing  chest pain  dizziness  fever, chills  flushing of the face and neck (reddening)  low blood pressure  rash, fever, and swollen lymph nodes  redness, blistering, peeling or loosening of the skin, including inside the mouth  unusual bleeding or bruising  unusually weak or tired Side effects that usually do not require medical attention (report to your doctor or health care professional if they continue or are bothersome):  nausea, vomiting  pain, swelling where injected  stomach cramps This list may not describe all possible side effects. Call your doctor for medical advice about side effects. You may report side effects to FDA at 1-800-FDA-1088. Where should I keep my medicine? Keep out of the reach of children. You will be instructed on how to store this medicine, if needed. Throw away any unused medicine after the expiration date on the label. NOTE: This sheet is a summary. It may not cover all possible information. If you have questions about this medicine, talk to your doctor,  pharmacist, or health care provider.  2020 Elsevier/Gold Standard (2018-05-14 16:14:12)

## 2019-12-16 NOTE — Progress Notes (Signed)
Lauren Garrison was seen in the infusion room today as she presents for ongoing dosing with Rocephin and vancomycin.  She is scheduled to have a left breast expander removed next Wednesday.  She continues to have some erythema along her right medial breast however her left mastectomy scar appears to be less erythematous and less warm to the touch.  We will proceed with her dosing of vancomycin and Rocephin premedicated with IV Benadryl today.  She will return tomorrow for additional dosing of antibiotics and will have this repeated on Monday and Tuesday of next week also.  She then will proceed with her surgical removal of her tissue expander next Wednesday.  Sandi Mealy, MHS, PA-C Physician Assistant

## 2019-12-16 NOTE — Progress Notes (Signed)
Pt reports itching during Vancomycin infusion despite premed of 25 mg IV benadryl. Dex 10 mg per VO from V Tanner PA. Pharmacy notified.[name] Pharmacist recommend slowing rate of Vancomycin infusion by half. Pt reports relief of itching w/ decadron
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
## 2019-12-17 ENCOUNTER — Inpatient Hospital Stay: Payer: 59

## 2019-12-17 ENCOUNTER — Other Ambulatory Visit: Payer: Self-pay

## 2019-12-17 VITALS — BP 143/65 | HR 85 | Temp 97.5°F | Resp 18

## 2019-12-17 DIAGNOSIS — L03313 Cellulitis of chest wall: Secondary | ICD-10-CM

## 2019-12-17 DIAGNOSIS — N61 Mastitis without abscess: Secondary | ICD-10-CM | POA: Diagnosis not present

## 2019-12-17 MED ORDER — DEXAMETHASONE SODIUM PHOSPHATE 10 MG/ML IJ SOLN
10.0000 mg | Freq: Once | INTRAMUSCULAR | Status: AC
  Administered 2019-12-17: 10 mg via INTRAVENOUS

## 2019-12-17 MED ORDER — DIPHENHYDRAMINE HCL 50 MG/ML IJ SOLN
INTRAMUSCULAR | Status: AC
  Filled 2019-12-17: qty 1

## 2019-12-17 MED ORDER — DIPHENHYDRAMINE HCL 50 MG/ML IJ SOLN
25.0000 mg | Freq: Once | INTRAMUSCULAR | Status: AC
  Administered 2019-12-17: 25 mg via INTRAVENOUS

## 2019-12-17 MED ORDER — DEXAMETHASONE SODIUM PHOSPHATE 10 MG/ML IJ SOLN
INTRAMUSCULAR | Status: AC
  Filled 2019-12-17: qty 1

## 2019-12-17 NOTE — Patient Instructions (Signed)
Ceftriaxone Injection What is this medicine? CEFTRIAXONE (sef try AX one) is a cephalosporin antibiotic. It treats some infections caused by bacteria. It will not work for colds, the flu, or other viruses. This medicine may be used for other purposes; ask your health care provider or pharmacist if you have questions. COMMON BRAND NAME(S): Ceftrisol Plus, Rocephin What should I tell my health care provider before I take this medicine? They need to know if you have any of these conditions:  any chronic illness  bowel disease, like colitis  both kidney and liver disease  high bilirubin level in newborn patients  an unusual or allergic reaction to ceftriaxone, other cephalosporin or penicillin antibiotics, foods, dyes, or preservatives  pregnant or trying to get pregnant  breast-feeding How should I use this medicine? This drug is injected into a muscle or a vein. It is usually given by a health care provider in a hospital or clinic setting. If you get this drug at home, you will be taught how to prepare and give it. Use exactly as directed. Take it as directed on the prescription label at the same time every day. Keep taking it unless your health care provider tells you to stop. It is important that you put your used needles and syringes in a special sharps container. Do not put them in a trash can. If you do not have a sharps container, call your pharmacist or health care provider to get one. Talk to your health care provider about the use of this drug in children. While it may be prescribed for children as young as newborns for selected conditions, precautions do apply. Overdosage: If you think you have taken too much of this medicine contact a poison control center or emergency room at once. NOTE: This medicine is only for you. Do not share this medicine with others. What if I miss a dose? It is important not to miss your dose. Call your health care provider if you are unable to keep an  appointment. If you give yourself this drug at home and you miss a dose, take it as soon as you can. If it is almost time for your next dose, take only that dose. Do not take double or extra doses. What may interact with this medicine? Do not take this medicine with any of the following medications:  intravenous calcium This medicine may also interact with the following medications:  birth control pills This list may not describe all possible interactions. Give your health care provider a list of all the medicines, herbs, non-prescription drugs, or dietary supplements you use. Also tell them if you smoke, drink alcohol, or use illegal drugs. Some items may interact with your medicine. What should I watch for while using this medicine? Tell your doctor or health care provider if your symptoms do not improve or if they get worse. This medicine may cause serious skin reactions. They can happen weeks to months after starting the medicine. Contact your health care provider right away if you notice fevers or flu-like symptoms with a rash. The rash may be red or purple and then turn into blisters or peeling of the skin. Or, you might notice a red rash with swelling of the face, lips or lymph nodes in your neck or under your arms. Do not treat diarrhea with over the counter products. Contact your doctor if you have diarrhea that lasts more than 2 days or if it is severe and watery. If you are being treated for   a sexually transmitted disease, avoid sexual contact until you have finished your treatment. Having sex can infect your sexual partner. Calcium may bind to this medicine and cause lung or kidney problems. Avoid calcium products while taking this medicine and for 48 hours after taking the last dose of this medicine. What side effects may I notice from receiving this medicine? Side effects that you should report to your doctor or health care professional as soon as possible:  allergic reactions like  skin rash, itching or hives, swelling of the face, lips, or tongue  breathing problems  fever, chills  irregular heartbeat  pain when passing urine  redness, blistering, peeling, or loosening of the skin, including inside the mouth  seizures  stomach pain, cramps  unusual bleeding, bruising  unusually weak or tired Side effects that usually do not require medical attention (report to your doctor or health care professional if they continue or are bothersome):  diarrhea  dizzy, drowsy  headache  nausea, vomiting  pain, swelling, irritation where injected  stomach upset  sweating This list may not describe all possible side effects. Call your doctor for medical advice about side effects. You may report side effects to FDA at 1-800-FDA-1088. Where should I keep my medicine? Keep out of the reach of children and pets. You will be instructed on how to store this drug. Protect from light. Throw away any unused drug after the expiration date. NOTE: This sheet is a summary. It may not cover all possible information. If you have questions about this medicine, talk to your doctor, pharmacist, or health care provider.  2020 Elsevier/Gold Standard (2018-09-09 18:29:21)  Vancomycin injection What is this medicine? VANCOMYCIN Lucianne Lei koe MYE sin) is a glycopeptide antibiotic. It is used to treat certain kinds of bacterial infections. It will not work for colds, flu, or other viral infections. This medicine may be used for other purposes; ask your health care provider or pharmacist if you have questions. COMMON BRAND NAME(S): Glo Herring What should I tell my health care provider before I take this medicine? They need to know if you have any of these conditions:  dehydration  hearing loss  kidney disease  other chronic illness  an unusual or allergic reaction to vancomycin, other medicines, foods, dyes, or preservatives  pregnant or trying to get  pregnant  breast-feeding How should I use this medicine? This medicine is infused into a vein. It is usually given by a health care provider in a hospital or clinic. If you receive this medicine at home, you will receive special instructions. Take your medicine at regular intervals. Do not take your medicine more often than directed. Take all of your medicine as directed even if you think you are better. Do not skip doses or stop your medicine early. It is important that you put your used needles and syringes in a special sharps container. Do not put them in a trash can. If you do not have a sharps container, call your pharmacist or healthcare provider to get one. Talk to your pediatrician regarding the use of this medicine in children. While this drug may be prescribed for even very young infants for selected conditions, precautions do apply. Overdosage: If you think you have taken too much of this medicine contact a poison control center or emergency room at once. NOTE: This medicine is only for you. Do not share this medicine with others. What if I miss a dose? If you miss a dose, take it as soon as  you can. If it is almost time for your next dose, take only that dose. Do not take double or extra doses. What may interact with this medicine?  amphotericin B  anesthetics  bacitracin  birth control pills  cisplatin  colistin  diuretics  other aminoglycoside antibiotics  polymyxin B This list may not describe all possible interactions. Give your health care provider a list of all the medicines, herbs, non-prescription drugs, or dietary supplements you use. Also tell them if you smoke, drink alcohol, or use illegal drugs. Some items may interact with your medicine. What should I watch for while using this medicine? Tell your doctor or health care provider if your symptoms do not improve or if you get new symptoms. Your condition and lab work will be monitored while you are taking this  medicine. Do not treat diarrhea with over the counter products. Contact your doctor if you have diarrhea that lasts more than 2 days or if it is severe and watery. This medicine may cause serious skin reactions. They can happen weeks to months after starting the medicine. Contact your health care provider right away if you notice fevers or flu-like symptoms with a rash. The rash may be red or purple and then turn into blisters or peeling of the skin. Or, you might notice a red rash with swelling of the face, lips or lymph nodes in your neck or under your arms. What side effects may I notice from receiving this medicine? Side effects that you should report to your doctor or health care professional as soon as possible:  allergic reactions like skin rash, itching or hives, swelling of the face, lips, or tongue  breathing difficulty, wheezing  change in amount, color of urine  change in hearing  chest pain  dizziness  fever, chills  flushing of the face and neck (reddening)  low blood pressure  rash, fever, and swollen lymph nodes  redness, blistering, peeling or loosening of the skin, including inside the mouth  unusual bleeding or bruising  unusually weak or tired Side effects that usually do not require medical attention (report to your doctor or health care professional if they continue or are bothersome):  nausea, vomiting  pain, swelling where injected  stomach cramps This list may not describe all possible side effects. Call your doctor for medical advice about side effects. You may report side effects to FDA at 1-800-FDA-1088. Where should I keep my medicine? Keep out of the reach of children. You will be instructed on how to store this medicine, if needed. Throw away any unused medicine after the expiration date on the label. NOTE: This sheet is a summary. It may not cover all possible information. If you have questions about this medicine, talk to your doctor,  pharmacist, or health care provider.  2020 Elsevier/Gold Standard (2018-05-14 16:14:12)

## 2019-12-19 ENCOUNTER — Inpatient Hospital Stay: Payer: 59 | Attending: Oncology | Admitting: Medical

## 2019-12-19 ENCOUNTER — Other Ambulatory Visit (HOSPITAL_COMMUNITY)
Admission: RE | Admit: 2019-12-19 | Discharge: 2019-12-19 | Disposition: A | Discharge status: Home / Self Care | Payer: 59 | Source: Ambulatory Visit | Attending: Plastic Surgery | Admitting: Plastic Surgery

## 2019-12-19 ENCOUNTER — Other Ambulatory Visit: Payer: Self-pay

## 2019-12-19 VITALS — BP 137/68 | HR 71 | Temp 98.8°F | Resp 18 | Ht 65.0 in | Wt 237.9 lb

## 2019-12-19 DIAGNOSIS — Z5111 Encounter for antineoplastic chemotherapy: Secondary | ICD-10-CM | POA: Diagnosis present

## 2019-12-19 DIAGNOSIS — N61 Mastitis without abscess: Secondary | ICD-10-CM | POA: Diagnosis present

## 2019-12-19 DIAGNOSIS — C50412 Malignant neoplasm of upper-outer quadrant of left female breast: Secondary | ICD-10-CM | POA: Diagnosis present

## 2019-12-19 DIAGNOSIS — Z170 Estrogen receptor positive status [ER+]: Secondary | ICD-10-CM | POA: Insufficient documentation

## 2019-12-19 DIAGNOSIS — Z01812 Encounter for preprocedural laboratory examination: Secondary | ICD-10-CM | POA: Diagnosis present

## 2019-12-19 DIAGNOSIS — L03313 Cellulitis of chest wall: Secondary | ICD-10-CM

## 2019-12-19 DIAGNOSIS — Z20822 Contact with and (suspected) exposure to covid-19: Secondary | ICD-10-CM | POA: Diagnosis not present

## 2019-12-19 LAB — SARS CORONAVIRUS 2 (TAT 6-24 HRS): SARS Coronavirus 2: NEGATIVE

## 2019-12-19 MED ORDER — VANCOMYCIN HCL 1000 MG IV SOLR
1000.0000 mg | Freq: Once | INTRAVENOUS | Status: AC
  Administered 2019-12-19: 1000 mg via INTRAVENOUS
  Filled 2019-12-19: qty 1000

## 2019-12-19 MED ORDER — SODIUM CHLORIDE 0.9 % IV SOLN
10.0000 mg | Freq: Once | INTRAVENOUS | Status: AC
  Administered 2019-12-19: 10 mg via INTRAVENOUS
  Filled 2019-12-19: qty 10

## 2019-12-19 MED ORDER — DEXTROSE 5 % IV SOLN
2.0000 g | Freq: Once | INTRAVENOUS | Status: AC
  Administered 2019-12-19: 2 g via INTRAVENOUS
  Filled 2019-12-19: qty 20

## 2019-12-19 MED ORDER — DIPHENHYDRAMINE HCL 50 MG/ML IJ SOLN
25.0000 mg | Freq: Once | INTRAMUSCULAR | Status: AC
  Administered 2019-12-19: 25 mg via INTRAVENOUS

## 2019-12-19 NOTE — Progress Notes (Signed)
Patient returns for Rocphin and vancomycin.

## 2019-12-19 NOTE — Patient Instructions (Signed)
Vancomycin capsules What is this medicine? VANCOMYCIN Lucianne Lei koe MYE sin) is a glycopeptide antibiotic. It is used to treat certain kinds of bacterial infections in the bowel. It will not work for colds, flu, or other viral infections. This medicine may be used for other purposes; ask your health care provider or pharmacist if you have questions. COMMON BRAND NAME(S): Vancocin What should I tell my health care provider before I take this medicine? They need to know if you have any of these conditions:  bowel, intestines, stomach disease  kidney disease  an unusual or allergic reaction to vancomycin, other medicines, foods, dyes, or preservatives  pregnant or trying to get pregnant  breast-feeding How should I use this medicine? Take this medicine by mouth with a glass of water. Follow the directions on the prescription label. Take your medicine at regular intervals. Do not take your medicine more often than directed. Take all of your medicine as directed even if you think you are better. Do not skip doses or stop your medicine early. Talk to your pediatrician regarding the use of this medicine in children. Special care may be needed. Overdosage: If you think you have taken too much of this medicine contact a poison control center or emergency room at once. NOTE: This medicine is only for you. Do not share this medicine with others. What if I miss a dose? If you miss a dose, take it as soon as you can. If it is almost time for your next dose, take only that dose. Do not take double or extra doses. What may interact with this medicine?  birth control pills  cholestyramine  colestipol  vancomycin injection This list may not describe all possible interactions. Give your health care provider a list of all the medicines, herbs, non-prescription drugs, or dietary supplements you use. Also tell them if you smoke, drink alcohol, or use illegal drugs. Some items may interact with your  medicine. What should I watch for while using this medicine? Tell your doctor or health care provider if your symptoms do not improve or if you get new symptoms. This medicine may cause serious skin reactions. They can happen weeks to months after starting the medicine. Contact your health care provider right away if you notice fevers or flu-like symptoms with a rash. The rash may be red or purple and then turn into blisters or peeling of the skin. Or, you might notice a red rash with swelling of the face, lips or lymph nodes in your neck or under your arms. Avoid taking this medicine within 3 or 4 hours of taking cholestyramine or colestipol. What side effects may I notice from receiving this medicine? Side effects that you should report to your doctor or health care professional as soon as possible:  allergic reactions like skin rash, itching or hives, swelling of the face, lips, or tongue  breathing difficulty  change in amount, color of urine  change in hearing  dizziness  fever, infection  rash, fever, and swollen lymph nodes  redness, blistering, peeling or loosening of the skin, including inside the mouth  unusual bleeding or bruising  unusually weak or tired Side effects that usually do not require medical attention (report to your doctor or health care professional if they continue or are bothersome):  nausea, vomiting  stomach cramps This list may not describe all possible side effects. Call your doctor for medical advice about side effects. You may report side effects to FDA at 1-800-FDA-1088. Where should I  keep my medicine? Keep out of the reach of children. Store at room temperature between 15 and 30 degrees C (59 and 86 degrees F). Throw away any unused medicine after the expiration date. NOTE: This sheet is a summary. It may not cover all possible information. If you have questions about this medicine, talk to your doctor, pharmacist, or health care provider.   2020 Elsevier/Gold Standard (2018-05-14 16:12:37) Ceftriaxone Injection What is this medicine? CEFTRIAXONE (sef try AX one) is a cephalosporin antibiotic. It treats some infections caused by bacteria. It will not work for colds, the flu, or other viruses. This medicine may be used for other purposes; ask your health care provider or pharmacist if you have questions. COMMON BRAND NAME(S): Ceftrisol Plus, Rocephin What should I tell my health care provider before I take this medicine? They need to know if you have any of these conditions:  any chronic illness  bowel disease, like colitis  both kidney and liver disease  high bilirubin level in newborn patients  an unusual or allergic reaction to ceftriaxone, other cephalosporin or penicillin antibiotics, foods, dyes, or preservatives  pregnant or trying to get pregnant  breast-feeding How should I use this medicine? This drug is injected into a muscle or a vein. It is usually given by a health care provider in a hospital or clinic setting. If you get this drug at home, you will be taught how to prepare and give it. Use exactly as directed. Take it as directed on the prescription label at the same time every day. Keep taking it unless your health care provider tells you to stop. It is important that you put your used needles and syringes in a special sharps container. Do not put them in a trash can. If you do not have a sharps container, call your pharmacist or health care provider to get one. Talk to your health care provider about the use of this drug in children. While it may be prescribed for children as young as newborns for selected conditions, precautions do apply. Overdosage: If you think you have taken too much of this medicine contact a poison control center or emergency room at once. NOTE: This medicine is only for you. Do not share this medicine with others. What if I miss a dose? It is important not to miss your dose. Call your  health care provider if you are unable to keep an appointment. If you give yourself this drug at home and you miss a dose, take it as soon as you can. If it is almost time for your next dose, take only that dose. Do not take double or extra doses. What may interact with this medicine? Do not take this medicine with any of the following medications:  intravenous calcium This medicine may also interact with the following medications:  birth control pills This list may not describe all possible interactions. Give your health care provider a list of all the medicines, herbs, non-prescription drugs, or dietary supplements you use. Also tell them if you smoke, drink alcohol, or use illegal drugs. Some items may interact with your medicine. What should I watch for while using this medicine? Tell your doctor or health care provider if your symptoms do not improve or if they get worse. This medicine may cause serious skin reactions. They can happen weeks to months after starting the medicine. Contact your health care provider right away if you notice fevers or flu-like symptoms with a rash. The rash may be red  or purple and then turn into blisters or peeling of the skin. Or, you might notice a red rash with swelling of the face, lips or lymph nodes in your neck or under your arms. Do not treat diarrhea with over the counter products. Contact your doctor if you have diarrhea that lasts more than 2 days or if it is severe and watery. If you are being treated for a sexually transmitted disease, avoid sexual contact until you have finished your treatment. Having sex can infect your sexual partner. Calcium may bind to this medicine and cause lung or kidney problems. Avoid calcium products while taking this medicine and for 48 hours after taking the last dose of this medicine. What side effects may I notice from receiving this medicine? Side effects that you should report to your doctor or health care professional  as soon as possible:  allergic reactions like skin rash, itching or hives, swelling of the face, lips, or tongue  breathing problems  fever, chills  irregular heartbeat  pain when passing urine  redness, blistering, peeling, or loosening of the skin, including inside the mouth  seizures  stomach pain, cramps  unusual bleeding, bruising  unusually weak or tired Side effects that usually do not require medical attention (report to your doctor or health care professional if they continue or are bothersome):  diarrhea  dizzy, drowsy  headache  nausea, vomiting  pain, swelling, irritation where injected  stomach upset  sweating This list may not describe all possible side effects. Call your doctor for medical advice about side effects. You may report side effects to FDA at 1-800-FDA-1088. Where should I keep my medicine? Keep out of the reach of children and pets. You will be instructed on how to store this drug. Protect from light. Throw away any unused drug after the expiration date. NOTE: This sheet is a summary. It may not cover all possible information. If you have questions about this medicine, talk to your doctor, pharmacist, or health care provider.  2020 Elsevier/Gold Standard (2018-09-09 18:29:21)

## 2019-12-20 ENCOUNTER — Ambulatory Visit: Payer: 59 | Admitting: Plastic Surgery

## 2019-12-20 ENCOUNTER — Other Ambulatory Visit: Payer: Self-pay

## 2019-12-20 ENCOUNTER — Inpatient Hospital Stay: Payer: 59

## 2019-12-20 ENCOUNTER — Other Ambulatory Visit: Payer: Self-pay | Admitting: Medical

## 2019-12-20 VITALS — BP 149/72 | HR 84 | Temp 98.5°F | Resp 17

## 2019-12-20 DIAGNOSIS — Z5111 Encounter for antineoplastic chemotherapy: Secondary | ICD-10-CM | POA: Diagnosis not present

## 2019-12-20 DIAGNOSIS — L03313 Cellulitis of chest wall: Secondary | ICD-10-CM

## 2019-12-20 MED ORDER — SODIUM CHLORIDE 0.9 % IV SOLN: 10.0000 mg | Freq: Once | INTRAVENOUS | Status: DC

## 2019-12-20 MED ORDER — SODIUM CHLORIDE 0.9 % IV SOLN
10.0000 mg | Freq: Once | INTRAVENOUS | Status: AC
  Administered 2019-12-20: 10 mg via INTRAVENOUS
  Filled 2019-12-20: qty 10

## 2019-12-20 MED ORDER — SODIUM CHLORIDE 0.9 % IV SOLN
Freq: Once | INTRAVENOUS | Status: AC
  Filled 2019-12-20: qty 250

## 2019-12-20 MED ORDER — DIPHENHYDRAMINE HCL 50 MG/ML IJ SOLN: 25.0000 mg | Freq: Once | INTRAMUSCULAR | Status: DC

## 2019-12-20 MED ORDER — DIPHENHYDRAMINE HCL 50 MG/ML IJ SOLN
INTRAMUSCULAR | Status: AC
  Filled 2019-12-20: qty 1

## 2019-12-20 MED ORDER — DEXTROSE 5 % IV SOLN
2.0000 g | Freq: Once | INTRAVENOUS | Status: AC
  Administered 2019-12-20: 2 g via INTRAVENOUS
  Filled 2019-12-20: qty 20

## 2019-12-20 MED ORDER — VANCOMYCIN HCL 1000 MG IV SOLR
1000.0000 mg | Freq: Once | INTRAVENOUS | Status: AC
  Administered 2019-12-20: 1000 mg via INTRAVENOUS
  Filled 2019-12-20: qty 1000

## 2019-12-20 MED ORDER — DIPHENHYDRAMINE HCL 50 MG/ML IJ SOLN
25.0000 mg | Freq: Once | INTRAMUSCULAR | Status: AC
  Administered 2019-12-20: 25 mg via INTRAVENOUS

## 2019-12-20 NOTE — Patient Instructions (Signed)
Ceftriaxone Injection What is this medicine? CEFTRIAXONE (sef try AX one) is a cephalosporin antibiotic. It treats some infections caused by bacteria. It will not work for colds, the flu, or other viruses. This medicine may be used for other purposes; ask your health care provider or pharmacist if you have questions. COMMON BRAND NAME(S): Ceftrisol Plus, Rocephin What should I tell my health care provider before I take this medicine? They need to know if you have any of these conditions:  any chronic illness  bowel disease, like colitis  both kidney and liver disease  high bilirubin level in newborn patients  an unusual or allergic reaction to ceftriaxone, other cephalosporin or penicillin antibiotics, foods, dyes, or preservatives  pregnant or trying to get pregnant  breast-feeding How should I use this medicine? This drug is injected into a muscle or a vein. It is usually given by a health care provider in a hospital or clinic setting. If you get this drug at home, you will be taught how to prepare and give it. Use exactly as directed. Take it as directed on the prescription label at the same time every day. Keep taking it unless your health care provider tells you to stop. It is important that you put your used needles and syringes in a special sharps container. Do not put them in a trash can. If you do not have a sharps container, call your pharmacist or health care provider to get one. Talk to your health care provider about the use of this drug in children. While it may be prescribed for children as young as newborns for selected conditions, precautions do apply. Overdosage: If you think you have taken too much of this medicine contact a poison control center or emergency room at once. NOTE: This medicine is only for you. Do not share this medicine with others. What if I miss a dose? It is important not to miss your dose. Call your health care provider if you are unable to keep an  appointment. If you give yourself this drug at home and you miss a dose, take it as soon as you can. If it is almost time for your next dose, take only that dose. Do not take double or extra doses. What may interact with this medicine? Do not take this medicine with any of the following medications:  intravenous calcium This medicine may also interact with the following medications:  birth control pills This list may not describe all possible interactions. Give your health care provider a list of all the medicines, herbs, non-prescription drugs, or dietary supplements you use. Also tell them if you smoke, drink alcohol, or use illegal drugs. Some items may interact with your medicine. What should I watch for while using this medicine? Tell your doctor or health care provider if your symptoms do not improve or if they get worse. This medicine may cause serious skin reactions. They can happen weeks to months after starting the medicine. Contact your health care provider right away if you notice fevers or flu-like symptoms with a rash. The rash may be red or purple and then turn into blisters or peeling of the skin. Or, you might notice a red rash with swelling of the face, lips or lymph nodes in your neck or under your arms. Do not treat diarrhea with over the counter products. Contact your doctor if you have diarrhea that lasts more than 2 days or if it is severe and watery. If you are being treated for   a sexually transmitted disease, avoid sexual contact until you have finished your treatment. Having sex can infect your sexual partner. Calcium may bind to this medicine and cause lung or kidney problems. Avoid calcium products while taking this medicine and for 48 hours after taking the last dose of this medicine. What side effects may I notice from receiving this medicine? Side effects that you should report to your doctor or health care professional as soon as possible:  allergic reactions like  skin rash, itching or hives, swelling of the face, lips, or tongue  breathing problems  fever, chills  irregular heartbeat  pain when passing urine  redness, blistering, peeling, or loosening of the skin, including inside the mouth  seizures  stomach pain, cramps  unusual bleeding, bruising  unusually weak or tired Side effects that usually do not require medical attention (report to your doctor or health care professional if they continue or are bothersome):  diarrhea  dizzy, drowsy  headache  nausea, vomiting  pain, swelling, irritation where injected  stomach upset  sweating This list may not describe all possible side effects. Call your doctor for medical advice about side effects. You may report side effects to FDA at 1-800-FDA-1088. Where should I keep my medicine? Keep out of the reach of children and pets. You will be instructed on how to store this drug. Protect from light. Throw away any unused drug after the expiration date. NOTE: This sheet is a summary. It may not cover all possible information. If you have questions about this medicine, talk to your doctor, pharmacist, or health care provider.  2020 Elsevier/Gold Standard (2018-09-09 18:29:21)  Vancomycin injection What is this medicine? VANCOMYCIN Lucianne Lei koe MYE sin) is a glycopeptide antibiotic. It is used to treat certain kinds of bacterial infections. It will not work for colds, flu, or other viral infections. This medicine may be used for other purposes; ask your health care provider or pharmacist if you have questions. COMMON BRAND NAME(S): Glo Herring What should I tell my health care provider before I take this medicine? They need to know if you have any of these conditions:  dehydration  hearing loss  kidney disease  other chronic illness  an unusual or allergic reaction to vancomycin, other medicines, foods, dyes, or preservatives  pregnant or trying to get  pregnant  breast-feeding How should I use this medicine? This medicine is infused into a vein. It is usually given by a health care provider in a hospital or clinic. If you receive this medicine at home, you will receive special instructions. Take your medicine at regular intervals. Do not take your medicine more often than directed. Take all of your medicine as directed even if you think you are better. Do not skip doses or stop your medicine early. It is important that you put your used needles and syringes in a special sharps container. Do not put them in a trash can. If you do not have a sharps container, call your pharmacist or healthcare provider to get one. Talk to your pediatrician regarding the use of this medicine in children. While this drug may be prescribed for even very young infants for selected conditions, precautions do apply. Overdosage: If you think you have taken too much of this medicine contact a poison control center or emergency room at once. NOTE: This medicine is only for you. Do not share this medicine with others. What if I miss a dose? If you miss a dose, take it as soon as  you can. If it is almost time for your next dose, take only that dose. Do not take double or extra doses. What may interact with this medicine?  amphotericin B  anesthetics  bacitracin  birth control pills  cisplatin  colistin  diuretics  other aminoglycoside antibiotics  polymyxin B This list may not describe all possible interactions. Give your health care provider a list of all the medicines, herbs, non-prescription drugs, or dietary supplements you use. Also tell them if you smoke, drink alcohol, or use illegal drugs. Some items may interact with your medicine. What should I watch for while using this medicine? Tell your doctor or health care provider if your symptoms do not improve or if you get new symptoms. Your condition and lab work will be monitored while you are taking this  medicine. Do not treat diarrhea with over the counter products. Contact your doctor if you have diarrhea that lasts more than 2 days or if it is severe and watery. This medicine may cause serious skin reactions. They can happen weeks to months after starting the medicine. Contact your health care provider right away if you notice fevers or flu-like symptoms with a rash. The rash may be red or purple and then turn into blisters or peeling of the skin. Or, you might notice a red rash with swelling of the face, lips or lymph nodes in your neck or under your arms. What side effects may I notice from receiving this medicine? Side effects that you should report to your doctor or health care professional as soon as possible:  allergic reactions like skin rash, itching or hives, swelling of the face, lips, or tongue  breathing difficulty, wheezing  change in amount, color of urine  change in hearing  chest pain  dizziness  fever, chills  flushing of the face and neck (reddening)  low blood pressure  rash, fever, and swollen lymph nodes  redness, blistering, peeling or loosening of the skin, including inside the mouth  unusual bleeding or bruising  unusually weak or tired Side effects that usually do not require medical attention (report to your doctor or health care professional if they continue or are bothersome):  nausea, vomiting  pain, swelling where injected  stomach cramps This list may not describe all possible side effects. Call your doctor for medical advice about side effects. You may report side effects to FDA at 1-800-FDA-1088. Where should I keep my medicine? Keep out of the reach of children. You will be instructed on how to store this medicine, if needed. Throw away any unused medicine after the expiration date on the label. NOTE: This sheet is a summary. It may not cover all possible information. If you have questions about this medicine, talk to your doctor,  pharmacist, or health care provider.  2020 Elsevier/Gold Standard (2018-05-14 16:14:12)

## 2019-12-21 ENCOUNTER — Ambulatory Visit (HOSPITAL_BASED_OUTPATIENT_CLINIC_OR_DEPARTMENT_OTHER): Payer: 59 | Admitting: Anesthesiology

## 2019-12-21 ENCOUNTER — Encounter (HOSPITAL_BASED_OUTPATIENT_CLINIC_OR_DEPARTMENT_OTHER): Payer: Self-pay | Admitting: Plastic Surgery

## 2019-12-21 ENCOUNTER — Telehealth: Payer: Self-pay

## 2019-12-21 ENCOUNTER — Encounter (HOSPITAL_BASED_OUTPATIENT_CLINIC_OR_DEPARTMENT_OTHER)
Admission: RE | Disposition: A | Discharge status: Home / Self Care | Payer: Self-pay | Source: Ambulatory Visit | Attending: Plastic Surgery

## 2019-12-21 ENCOUNTER — Other Ambulatory Visit: Payer: Self-pay

## 2019-12-21 ENCOUNTER — Ambulatory Visit (HOSPITAL_BASED_OUTPATIENT_CLINIC_OR_DEPARTMENT_OTHER)
Admission: RE | Admit: 2019-12-21 | Discharge: 2019-12-21 | Disposition: A | Discharge status: Home / Self Care | Payer: 59 | Source: Ambulatory Visit | Attending: Plastic Surgery | Admitting: Plastic Surgery

## 2019-12-21 DIAGNOSIS — Z808 Family history of malignant neoplasm of other organs or systems: Secondary | ICD-10-CM | POA: Diagnosis not present

## 2019-12-21 DIAGNOSIS — I10 Essential (primary) hypertension: Secondary | ICD-10-CM | POA: Diagnosis not present

## 2019-12-21 DIAGNOSIS — Z833 Family history of diabetes mellitus: Secondary | ICD-10-CM | POA: Diagnosis not present

## 2019-12-21 DIAGNOSIS — Z882 Allergy status to sulfonamides status: Secondary | ICD-10-CM | POA: Insufficient documentation

## 2019-12-21 DIAGNOSIS — Z823 Family history of stroke: Secondary | ICD-10-CM | POA: Insufficient documentation

## 2019-12-21 DIAGNOSIS — Z923 Personal history of irradiation: Secondary | ICD-10-CM | POA: Insufficient documentation

## 2019-12-21 DIAGNOSIS — Z881 Allergy status to other antibiotic agents status: Secondary | ICD-10-CM | POA: Insufficient documentation

## 2019-12-21 DIAGNOSIS — Z888 Allergy status to other drugs, medicaments and biological substances status: Secondary | ICD-10-CM | POA: Insufficient documentation

## 2019-12-21 DIAGNOSIS — Z79899 Other long term (current) drug therapy: Secondary | ICD-10-CM | POA: Diagnosis not present

## 2019-12-21 DIAGNOSIS — Z8249 Family history of ischemic heart disease and other diseases of the circulatory system: Secondary | ICD-10-CM | POA: Insufficient documentation

## 2019-12-21 DIAGNOSIS — Z9012 Acquired absence of left breast and nipple: Secondary | ICD-10-CM | POA: Diagnosis not present

## 2019-12-21 DIAGNOSIS — Z45812 Encounter for adjustment or removal of left breast implant: Secondary | ICD-10-CM | POA: Insufficient documentation

## 2019-12-21 DIAGNOSIS — Z853 Personal history of malignant neoplasm of breast: Secondary | ICD-10-CM | POA: Diagnosis not present

## 2019-12-21 DIAGNOSIS — Z9882 Breast implant status: Secondary | ICD-10-CM | POA: Diagnosis not present

## 2019-12-21 HISTORY — PX: TISSUE EXPANDER PLACEMENT: SHX2530

## 2019-12-21 SURGERY — INSERTION, TISSUE EXPANDER
Anesthesia: General | Site: Breast | Laterality: Left

## 2019-12-21 MED ORDER — MIDAZOLAM HCL 5 MG/5ML IJ SOLN
INTRAMUSCULAR | Status: DC | PRN
  Administered 2019-12-21: 2 mg via INTRAVENOUS

## 2019-12-21 MED ORDER — CEFAZOLIN SODIUM-DEXTROSE 2-4 GM/100ML-% IV SOLN
2.0000 g | INTRAVENOUS | Status: AC
  Administered 2019-12-21: 2 g via INTRAVENOUS

## 2019-12-21 MED ORDER — OXYCODONE HCL 5 MG PO TABS: 5.0000 mg | ORAL_TABLET | ORAL | Status: DC | PRN

## 2019-12-21 MED ORDER — FENTANYL CITRATE (PF) 100 MCG/2ML IJ SOLN
INTRAMUSCULAR | Status: AC
  Filled 2019-12-21: qty 2

## 2019-12-21 MED ORDER — CEFAZOLIN SODIUM-DEXTROSE 2-4 GM/100ML-% IV SOLN
INTRAVENOUS | Status: AC
  Filled 2019-12-21: qty 100

## 2019-12-21 MED ORDER — FENTANYL CITRATE (PF) 100 MCG/2ML IJ SOLN: 25.0000 ug | INTRAMUSCULAR | Status: DC | PRN

## 2019-12-21 MED ORDER — LIDOCAINE 2% (20 MG/ML) 5 ML SYRINGE
INTRAMUSCULAR | Status: AC
  Filled 2019-12-21: qty 5

## 2019-12-21 MED ORDER — LIDOCAINE-EPINEPHRINE 1 %-1:100000 IJ SOLN
INTRAMUSCULAR | Status: DC | PRN
  Administered 2019-12-21: 20 mL

## 2019-12-21 MED ORDER — DEXAMETHASONE SODIUM PHOSPHATE 4 MG/ML IJ SOLN
INTRAMUSCULAR | Status: DC | PRN
  Administered 2019-12-21: 10 mg via INTRAVENOUS

## 2019-12-21 MED ORDER — ACETAMINOPHEN 325 MG RE SUPP: 650.0000 mg | RECTAL | Status: DC | PRN

## 2019-12-21 MED ORDER — LIDOCAINE-EPINEPHRINE 2 %-1:100000 IJ SOLN
INTRAMUSCULAR | Status: AC
  Filled 2019-12-21: qty 1

## 2019-12-21 MED ORDER — SCOPOLAMINE 1 MG/3DAYS TD PT72
MEDICATED_PATCH | TRANSDERMAL | Status: AC
  Filled 2019-12-21: qty 1

## 2019-12-21 MED ORDER — ACETAMINOPHEN 325 MG PO TABS: 650.0000 mg | ORAL_TABLET | ORAL | Status: DC | PRN

## 2019-12-21 MED ORDER — DIPHENHYDRAMINE HCL 50 MG/ML IJ SOLN
INTRAMUSCULAR | Status: AC
  Filled 2019-12-21: qty 1

## 2019-12-21 MED ORDER — FENTANYL CITRATE (PF) 100 MCG/2ML IJ SOLN
INTRAMUSCULAR | Status: DC | PRN
  Administered 2019-12-21: 100 ug via INTRAVENOUS
  Administered 2019-12-21: 25 ug via INTRAVENOUS

## 2019-12-21 MED ORDER — SODIUM CHLORIDE 0.9% FLUSH: 3.0000 mL | Freq: Two times a day (BID) | INTRAVENOUS | Status: DC

## 2019-12-21 MED ORDER — SUCCINYLCHOLINE CHLORIDE 200 MG/10ML IV SOSY
PREFILLED_SYRINGE | INTRAVENOUS | Status: AC
  Filled 2019-12-21: qty 10

## 2019-12-21 MED ORDER — ONDANSETRON HCL 4 MG/2ML IJ SOLN
INTRAMUSCULAR | Status: AC
  Filled 2019-12-21: qty 2

## 2019-12-21 MED ORDER — PROPOFOL 500 MG/50ML IV EMUL
INTRAVENOUS | Status: AC
  Filled 2019-12-21: qty 50

## 2019-12-21 MED ORDER — LIDOCAINE-EPINEPHRINE 1 %-1:100000 IJ SOLN
INTRAMUSCULAR | Status: AC
  Filled 2019-12-21: qty 1

## 2019-12-21 MED ORDER — PROPOFOL 500 MG/50ML IV EMUL
INTRAVENOUS | Status: DC | PRN
  Administered 2019-12-21: 200 ug/kg/min via INTRAVENOUS

## 2019-12-21 MED ORDER — OXYCODONE HCL 5 MG/5ML PO SOLN: 5.0000 mg | Freq: Once | ORAL | Status: DC | PRN

## 2019-12-21 MED ORDER — CHLORHEXIDINE GLUCONATE CLOTH 2 % EX PADS: 6.0000 | MEDICATED_PAD | Freq: Once | CUTANEOUS | Status: DC

## 2019-12-21 MED ORDER — EPHEDRINE 5 MG/ML INJ
INTRAVENOUS | Status: AC
  Filled 2019-12-21: qty 10

## 2019-12-21 MED ORDER — SCOPOLAMINE 1 MG/3DAYS TD PT72
MEDICATED_PATCH | TRANSDERMAL | Status: DC | PRN
  Administered 2019-12-21: 1 via TRANSDERMAL

## 2019-12-21 MED ORDER — ACETAMINOPHEN 500 MG PO TABS: 1000.0000 mg | ORAL_TABLET | Freq: Once | ORAL | Status: DC

## 2019-12-21 MED ORDER — MIDAZOLAM HCL 2 MG/2ML IJ SOLN
INTRAMUSCULAR | Status: AC
  Filled 2019-12-21: qty 2

## 2019-12-21 MED ORDER — ONDANSETRON HCL 4 MG/2ML IJ SOLN
INTRAMUSCULAR | Status: DC | PRN
  Administered 2019-12-21: 4 mg via INTRAVENOUS

## 2019-12-21 MED ORDER — PROPOFOL 10 MG/ML IV BOLUS
INTRAVENOUS | Status: AC
  Filled 2019-12-21: qty 20

## 2019-12-21 MED ORDER — DEXAMETHASONE SODIUM PHOSPHATE 10 MG/ML IJ SOLN
INTRAMUSCULAR | Status: AC
  Filled 2019-12-21: qty 1

## 2019-12-21 MED ORDER — SODIUM CHLORIDE 0.9 % IV SOLN: 250.0000 mL | INTRAVENOUS | Status: DC | PRN

## 2019-12-21 MED ORDER — OXYCODONE HCL 5 MG PO TABS: 5.0000 mg | ORAL_TABLET | Freq: Once | ORAL | Status: DC | PRN

## 2019-12-21 MED ORDER — PHENYLEPHRINE 40 MCG/ML (10ML) SYRINGE FOR IV PUSH (FOR BLOOD PRESSURE SUPPORT)
PREFILLED_SYRINGE | INTRAVENOUS | Status: AC
  Filled 2019-12-21: qty 10

## 2019-12-21 MED ORDER — PROPOFOL 10 MG/ML IV BOLUS
INTRAVENOUS | Status: DC | PRN
  Administered 2019-12-21: 200 mg via INTRAVENOUS

## 2019-12-21 MED ORDER — SODIUM CHLORIDE 0.9% FLUSH: 3.0000 mL | INTRAVENOUS | Status: DC | PRN

## 2019-12-21 MED ORDER — LACTATED RINGERS IV SOLN: INTRAVENOUS | Status: DC

## 2019-12-21 MED ORDER — LIDOCAINE HCL (CARDIAC) PF 100 MG/5ML IV SOSY
PREFILLED_SYRINGE | INTRAVENOUS | Status: DC | PRN
  Administered 2019-12-21: 50 mg via INTRAVENOUS

## 2019-12-21 SURGICAL SUPPLY — 66 items
ADH SKN CLS APL DERMABOND .7 (GAUZE/BANDAGES/DRESSINGS)
BAG DECANTER FOR FLEXI CONT (MISCELLANEOUS) ×2 IMPLANT
BINDER BREAST LRG (GAUZE/BANDAGES/DRESSINGS) IMPLANT
BINDER BREAST MEDIUM (GAUZE/BANDAGES/DRESSINGS) IMPLANT
BINDER BREAST XLRG (GAUZE/BANDAGES/DRESSINGS) ×2 IMPLANT
BINDER BREAST XXLRG (GAUZE/BANDAGES/DRESSINGS) IMPLANT
BIOPATCH RED 1 DISK 7.0 (GAUZE/BANDAGES/DRESSINGS) ×2 IMPLANT
BLADE HEX COATED 2.75 (ELECTRODE) ×2 IMPLANT
BLADE SURG 10 STRL SS (BLADE) IMPLANT
BLADE SURG 15 STRL LF DISP TIS (BLADE) ×1 IMPLANT
BLADE SURG 15 STRL SS (BLADE) ×2
BNDG GAUZE ELAST 4 BULKY (GAUZE/BANDAGES/DRESSINGS) IMPLANT
CANISTER SUCT 1200ML W/VALVE (MISCELLANEOUS) ×2 IMPLANT
CORD BIPOLAR FORCEPS 12FT (ELECTRODE) IMPLANT
COVER BACK TABLE 60X90IN (DRAPES) ×2 IMPLANT
COVER MAYO STAND STRL (DRAPES) ×2 IMPLANT
COVER WAND RF STERILE (DRAPES) IMPLANT
DECANTER SPIKE VIAL GLASS SM (MISCELLANEOUS) IMPLANT
DERMABOND ADVANCED (GAUZE/BANDAGES/DRESSINGS)
DERMABOND ADVANCED .7 DNX12 (GAUZE/BANDAGES/DRESSINGS) IMPLANT
DRAIN CHANNEL 19F RND (DRAIN) ×2 IMPLANT
DRAPE LAPAROSCOPIC ABDOMINAL (DRAPES) ×2 IMPLANT
DRSG PAD ABDOMINAL 8X10 ST (GAUZE/BANDAGES/DRESSINGS) ×4 IMPLANT
DRSG TEGADERM 2-3/8X2-3/4 SM (GAUZE/BANDAGES/DRESSINGS) IMPLANT
ELECT BLADE 4.0 EZ CLEAN MEGAD (MISCELLANEOUS) ×2
ELECT COATED BLADE 2.86 ST (ELECTRODE) ×2 IMPLANT
ELECT REM PT RETURN 9FT ADLT (ELECTROSURGICAL) ×2
ELECTRODE BLDE 4.0 EZ CLN MEGD (MISCELLANEOUS) ×1 IMPLANT
ELECTRODE REM PT RTRN 9FT ADLT (ELECTROSURGICAL) ×1 IMPLANT
EVACUATOR SILICONE 100CC (DRAIN) IMPLANT
FUNNEL KELLER 2 DISP (MISCELLANEOUS) IMPLANT
GAUZE SPONGE 4X4 12PLY STRL LF (GAUZE/BANDAGES/DRESSINGS) IMPLANT
GLOVE BIO SURGEON STRL SZ 6.5 (GLOVE) ×4 IMPLANT
GLOVE BIOGEL M STRL SZ7.5 (GLOVE) ×2 IMPLANT
GLOVE BIOGEL PI IND STRL 8 (GLOVE) ×1 IMPLANT
GLOVE BIOGEL PI INDICATOR 8 (GLOVE) ×1
GLOVE SURG SS PI 8.0 STRL IVOR (GLOVE) ×2 IMPLANT
GOWN STRL REUS W/ TWL LRG LVL3 (GOWN DISPOSABLE) ×3 IMPLANT
GOWN STRL REUS W/TWL LRG LVL3 (GOWN DISPOSABLE) ×6
IV NS 500ML (IV SOLUTION)
IV NS 500ML BAXH (IV SOLUTION) IMPLANT
KIT FILL SYSTEM UNIVERSAL (SET/KITS/TRAYS/PACK) IMPLANT
MANIFOLD NEPTUNE II (INSTRUMENTS) IMPLANT
NEEDLE HYPO 25X1 1.5 SAFETY (NEEDLE) IMPLANT
PACK BASIN DAY SURGERY FS (CUSTOM PROCEDURE TRAY) ×2 IMPLANT
PENCIL SMOKE EVACUATOR (MISCELLANEOUS) ×2 IMPLANT
PIN SAFETY STERILE (MISCELLANEOUS) ×2 IMPLANT
SLEEVE SCD COMPRESS KNEE MED (MISCELLANEOUS) ×2 IMPLANT
SPONGE LAP 18X18 RF (DISPOSABLE) ×4 IMPLANT
STRIP SUTURE WOUND CLOSURE 1/2 (MISCELLANEOUS) IMPLANT
SUT MNCRL AB 3-0 PS2 18 (SUTURE) ×2 IMPLANT
SUT MNCRL AB 4-0 PS2 18 (SUTURE) ×4 IMPLANT
SUT MON AB 5-0 PS2 18 (SUTURE) ×2 IMPLANT
SUT PDS AB 2-0 CT2 27 (SUTURE) ×6 IMPLANT
SUT SILK 3 0 PS 1 (SUTURE) ×2 IMPLANT
SUT VIC AB 3-0 SH 27 (SUTURE) ×4
SUT VIC AB 3-0 SH 27X BRD (SUTURE) ×2 IMPLANT
SUT VICRYL 4-0 PS2 18IN ABS (SUTURE) ×2 IMPLANT
SYR 50ML LL SCALE MARK (SYRINGE) IMPLANT
SYR BULB IRRIG 60ML STRL (SYRINGE) ×2 IMPLANT
SYR CONTROL 10ML LL (SYRINGE) IMPLANT
TOWEL GREEN STERILE FF (TOWEL DISPOSABLE) ×4 IMPLANT
TRAY DSU PREP LF (CUSTOM PROCEDURE TRAY) ×2 IMPLANT
TUBE CONNECTING 20X1/4 (TUBING) ×2 IMPLANT
UNDERPAD 30X36 HEAVY ABSORB (UNDERPADS AND DIAPERS) ×4 IMPLANT
YANKAUER SUCT BULB TIP NO VENT (SUCTIONS) ×2 IMPLANT

## 2019-12-21 NOTE — Op Note (Signed)
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 female born on 1960/11/22, is here for treatment of left breast cancer.  She underwent a mastectomy last month.  She has decided to remove the expander and move on with treatment.   PROCEDURE DETAILS:  The patient was seen prior to surgery and marked.  The IV antibiotics were given. The patient was taken to the operating room and given a general anesthetic. A standard time out was performed and all information was confirmed by those in the room. SCDs were placed.'s prepped and draped in the sterile fashion.  The area was marked with a marking pen in elliptical fashion to include the previous incision.  The 10 blade was used to make the excision of the skin.  This included a 3 x 15 cm area of skin.  The Bovie was used to dissect down to the muscle.  Superior and inferior flaps were raised in order to have a tension-free closure.  This was done with the Bovie.  Hemostasis was achieved with electrocautery.  I then cut through the ADM and muscle junction.  This exposed the expander.  The expander was released and removed.  There was a little bit of excess ADM that I excised off for an area of 1 x 10 cm.  The pocket was irrigated with saline solution I removed the anterior capsule.  Hemostasis was achieved with electrocautery.  The anterior portion of the ADM was kept in place and I resutured this to the pectoralis muscle.  The reason for that was so that the pectoralis muscle would not window shade superiorly and create a contracture.  This was done with 3-0 PDS.  A drain was placed  in the lateral aspect in the breast pocket deep to the muscle.  The drain was secured with a 3-0 silk.  All of the tissue excised was sent to pathology.  The deep layers were closed with 3 and 4-0 Monocryl.  Dermabond was applied with Steri-Strips and a honeycomb dressing.  The patient was allowed to wake up and taken to recovery room in stable condition at the end of the case. The family was notified at the end of the case.   The advanced practice practitioner (APP) assisted throughout the case.  The APP was essential in retraction and counter traction when needed to make the case progress smoothly.  This retraction and assistance made it possible to see the tissue plans for the procedure.  The assistance was needed for blood control, tissue re-approximation and assisted with closure of the incision site.

## 2019-12-21 NOTE — Transfer of Care (Signed)
Immediate Anesthesia Transfer of Care Note  Patient: Lauren Garrison  Procedure(s) Performed: TISSUE EXPANDER REMOVAL (Left Breast)  Patient Location: PACU  Anesthesia Type:General  Level of Consciousness: awake, alert , oriented, drowsy and patient cooperative  Airway & Oxygen Therapy: Patient Spontanous Breathing and Patient connected to face mask oxygen  Post-op Assessment: Report given to RN and Post -op Vital signs reviewed and stable  Post vital signs: Reviewed and stable  Last Vitals:  Vitals Value Taken Time  BP    Temp    Pulse 92 12/21/19 1025  Resp 17 12/21/19 1025  SpO2 93 % 12/21/19 1025  Vitals shown include unvalidated device data.  Last Pain:  Vitals:   12/21/19 0714  TempSrc: Oral  PainSc: 0-No pain         Complications: No complications documented.
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
## 2019-12-21 NOTE — Anesthesia Procedure Notes (Signed)
Procedure Name: LMA Insertion Date/Time: 12/21/2019 8:41 AM Performed by: Willa Frater, CRNA Pre-anesthesia Checklist: Patient identified, Emergency Drugs available, Suction available and Patient being monitored Patient Re-evaluated:Patient Re-evaluated prior to induction Oxygen Delivery Method: Circle system utilized Preoxygenation: Pre-oxygenation with 100% oxygen Induction Type: IV induction Ventilation: Mask ventilation without difficulty LMA: LMA inserted LMA Size: 4.0 Number of attempts: 1 Airway Equipment and Method: Bite block Placement Confirmation: positive ETCO2 Tube secured with: Tape Dental Injury: Teeth and Oropharynx as per pre-operative assessment

## 2019-12-21 NOTE — Anesthesia Postprocedure Evaluation (Signed)
Anesthesia Post Note  Patient: Caileen Veracruz  Procedure(s) Performed: TISSUE EXPANDER REMOVAL (Left Breast)     Patient location during evaluation: PACU Anesthesia Type: General Level of consciousness: awake and alert Pain management: pain level controlled Vital Signs Assessment: post-procedure vital signs reviewed and stable Respiratory status: spontaneous breathing, nonlabored ventilation, respiratory function stable and patient connected to nasal cannula oxygen Cardiovascular status: blood pressure returned to baseline and stable Postop Assessment: no apparent nausea or vomiting Anesthetic complications: no   No complications documented.  Last Vitals:  Vitals:   12/21/19 1109 12/21/19 1213  BP:  (!) 153/71  Pulse: 71 79  Resp: 14 16  Temp:  37.1 C  SpO2: 92% 96%    Last Pain:  Vitals:   12/21/19 1148  TempSrc:   PainSc: 0-No pain                 Bron Snellings L Magdalina Whitehead

## 2019-12-21 NOTE — Telephone Encounter (Signed)
Patient called requesting prescription for Norco, ibuprofen, and tylenol. She states she forgot to request this before surgery. Her preferred pharmacy is: [id number] - [place], [place]

## 2019-12-21 NOTE — Discharge Instructions (Signed)
INSTRUCTIONS FOR AFTER SURGERY   You will likely have some questions about what to expect following your operation.  The following information will help you and your family understand what to expect when you are discharged from the hospital.  Following these guidelines will help ensure a smooth recovery and reduce risks of complications.  Postoperative instructions include information on: diet, wound care, medications and physical activity.  AFTER SURGERY Expect to go home after the procedure.  In some cases, you may need to spend one night in the hospital for observation.  DIET This surgery does not require a specific diet.  However, I have to mention that the healthier you eat the better your body can start healing. It is important to increasing your protein intake.  This means limiting the foods with added sugar.  Focus on fruits and vegetables and some meat. It is very important to drink water after your surgery.  If your urine is bright yellow, then it is concentrated, and you need to drink more water.  As a general rule after surgery, you should have 8 ounces of water every hour while awake.  If you find you are persistently nauseated or unable to take in liquids let us know.  NO TOBACCO USE or EXPOSURE.  This will slow your healing process and increase the risk of a wound.  WOUND CARE If you have a drain: Clean with baby wipes for three days and then you can shower.   If you have steri-strips / tape directly attached to your skin leave them in place. It is OK to get these wet.  No baths, pools or hot tubs for two weeks. We close your incision to leave the smallest and best-looking scar. No ointment or creams on your incisions until given the go ahead.  Especially not Neosporin (Too many skin reactions with this one).  A few weeks after surgery you can use Mederma and start massaging the scar. We ask you to wear your binder or sports bra for the first 6 weeks around the clock, including while  sleeping. This provides added comfort and helps reduce the fluid accumulation at the surgery site.  ACTIVITY No heavy lifting until cleared by the doctor.  It is OK to walk and climb stairs. In fact, moving your legs is very important to decrease your risk of a blood clot.  It will also help keep you from getting deconditioned.  Every 1 to 2 hours get up and walk for 5 minutes. This will help with a quicker recovery back to normal.  Let pain be your guide so you don't do too much.  NO, you cannot do the spring cleaning and don't plan on taking care of anyone else.  This is your time for TLC.   WORK Everyone returns to work at different times. As a rough guide, most people take at least 1 - 2 weeks off prior to returning to work. If you need documentation for your job, bring the forms to your postoperative follow up visit.  DRIVING Arrange for someone to bring you home from the hospital.  You may be able to drive a few days after surgery but not while taking any narcotics or valium.  BOWEL MOVEMENTS Constipation can occur after anesthesia and while taking pain medication.  It is important to stay ahead for your comfort.  We recommend taking Milk of Magnesia (2 tablespoons; twice a day) while taking the pain pills.  SEROMA This is fluid your body tried to  put in the surgical site.  This is normal but if it creates excessive pain and swelling let us know.  It usually decreases in a few weeks.  MEDICATIONS and PAIN CONTROL At your preoperative visit for you history and physical you were given the following medications: 1. An antibiotic: Start this medication when you get home and take according to the instructions on the bottle. 2. Zofran 4 mg:  This is to treat nausea and vomiting.  You can take this every 6 hours as needed and only if needed. 3. Norco (hydrocodone/acetaminophen) 5/325 mg:  This is only to be used after you have taken the motrin or the tylenol. Every 8 hours as needed. Over the  counter Medication to take: 4. Ibuprofen (Motrin) 600 mg:  Take this every 6 hours.  If you have additional pain then take 500 mg of the tylenol.  Only take the Norco after you have tried these two. 5. Miralax or stool softener of choice: Take this according to the bottle if you take the [place] Call your surgeon's office if any of the following occur: . Fever 101 degrees F or greater . Excessive bleeding or fluid from the incision site. . Pain that increases over time without aid from the medications . Redness, warmth, or pus draining from incision sites . Persistent nausea or inability to take in liquids . Severe misshapen area that underwent the operation.  [facility] Specialist  What is the benefit of having a drain?  During surgery your tissue layers are separated.  This raw surface stimulates your body to fill the space with serous fluid.  This is normal but you don't want that fluid to collect and prevent healing.  A fluid collection can also become infected.  The Jackson-Pratt (JP) drain is used to eliminate this collection of fluid and allow the tissue to heal together.    Jackson-Pratt (JP) bulb    How to care for your drainage and suction unit at home Your drainage catheter will be connected to a collection device. The vacuum caused when the device is compressed allows drainage to collect in the device.    Wendee Copp your hands with soap and water before and after touching the system. . Empty the JP drain every 12 hours once you get home from your procedure. . Record the fluid amount on the record sheet included. . Start with stripping the drain tube to push the clots or excess fluid to the bulb.  Do this by pinching the tube with one hand near your skin.  Then with the other hand squeeze the tubing and work it toward the bulb.  This should be done several times a day.  This may collapse the tube which will correct on its own.   . Use a safety pin to attach  your collection device to your clothing so there is no tension on the insertion site.   . If you have drainage at the skin insertion site, you can apply a gauze dressing and secure it with tape. . If the drain falls out, apply a gauze dressing over the drain insertion site and secure with tape.   To empty the collection device:   . Release the stopper on the top of the collection unit (bulb).  Signa Kell contents into a measuring container such as a plastic medicine cup.  . Record the day and amount of drainage on the attached sheet. . This should be done at least  twice a day.    To compress the Jackson-Pratt Bulb:  . Release the stopper at the top of the bulb. [name] Squeeze the bulb tightly in your fist, squeezing air out of the bulb.  . Replace the stopper while the bulb is compressed.  . Be careful not to spill the contents when squeezing the bulb. . The drainage will start bright red and turn to pink and then yellow with time. . IMPORTANT: If the bulb is not squeezed before adding the stopper it will not draw out the fluid.  Care for the JP drain site and your skin daily:  . You may shower three days after surgery. . Secure the drain to a ribbon or cloth around your waist while showering so it does not pull out while showering. . Be sure your hands are cleaned with soap and water. . Use a clean wet cotton swab to clean the skin around the drain site.  . Use another cotton swab to place Vaseline or antibiotic ointment on the skin around the drain.     Contact your physician if any of the following occur:  [name] The fluid in the bulb becomes cloudy. . Your temperature is greater than 101.4.  [name] The incision opens. . If you have drainage at the skin insertion site, you can apply a gauze dressing and secure it with tape. . If the drain falls out, apply a gauze dressing over the drain insertion site and secure with tape.  . You will usually have more drainage when you are active than while you rest  or are asleep. If the drainage increases significantly or is bloody call the physician                             Bring this record with you to each office visit Date  Drainage Volume  Date   Drainage volume                                                                                                                                                                                            Post Anesthesia Home Care Instructions  Activity: Get plenty of rest for the remainder of the day. A responsible individual must stay with you for 24 hours following the procedure.  For the next 24 hours, DO NOT: -Drive a car -Paediatric nurse -Drink alcoholic beverages -Take any medication unless instructed by your physician -Make any legal decisions or sign important papers.  Meals: Start with liquid foods such as gelatin or soup. Progress to regular foods as tolerated. Avoid greasy,  spicy, heavy foods. If nausea and/or vomiting occur, drink only clear liquids until the nausea and/or vomiting subsides. Call your physician if vomiting continues.  Special Instructions/Symptoms: Your throat may feel dry or sore from the anesthesia or the breathing tube placed in your throat during surgery. If this causes discomfort, gargle with warm salt water. The discomfort should disappear within 24 hours.  If you had a scopolamine patch placed behind your ear for the management of post- operative nausea and/or vomiting:  1. The medication in the patch is effective for 72 hours, after which it should be removed.  Wrap patch in a tissue and discard in the trash. Wash hands thoroughly with soap and water. 2. You may remove the patch earlier than 72 hours if you experience unpleasant side effects which may include dry mouth, dizziness or visual disturbances. 3. Avoid touching the patch. Wash your hands with soap and water after contact with the patch.

## 2019-12-21 NOTE — Anesthesia Preprocedure Evaluation (Addendum)
Anesthesia Evaluation  Patient identified by MRN, date of birth, ID band Patient awake    Reviewed: Allergy & Precautions, NPO status , Patient's Chart, lab work & pertinent test results  History of Anesthesia Complications (+) PONV  Airway Mallampati: II  TM Distance: >3 FB Neck ROM: Full    Dental no notable dental hx.    Pulmonary sleep apnea ,    Pulmonary exam normal breath sounds clear to auscultation       Cardiovascular hypertension, negative cardio ROS Normal cardiovascular exam Rhythm:Regular Rate:Normal     Neuro/Psych  Headaches, negative psych ROS   GI/Hepatic negative GI ROS, Neg liver ROS,   Endo/Other  Morbid obesity (BMI 40)  Renal/GU negative Renal ROS  negative genitourinary   Musculoskeletal  (+) Arthritis ,   Abdominal   Peds  Hematology negative hematology ROS (+)   Anesthesia Other Findings Left breast CA  Reproductive/Obstetrics                            Anesthesia Physical Anesthesia Plan  ASA: III  Anesthesia Plan: General   Post-op Pain Management:    Induction: Intravenous  PONV Risk Score and Plan: 4 or greater and Ondansetron, Dexamethasone, Midazolam and Scopolamine patch - Pre-op  Airway Management Planned: LMA  Additional Equipment:   Intra-op Plan:   Post-operative Plan: Extubation in OR  Informed Consent: I have reviewed the patients History and Physical, chart, labs and discussed the procedure including the risks, benefits and alternatives for the proposed anesthesia with the patient or authorized representative who has indicated his/her understanding and acceptance.     Dental advisory given  Plan Discussed with: CRNA  Anesthesia Plan Comments:         Anesthesia Quick Evaluation

## 2019-12-22 ENCOUNTER — Encounter (HOSPITAL_BASED_OUTPATIENT_CLINIC_OR_DEPARTMENT_OTHER): Payer: Self-pay | Admitting: Plastic Surgery

## 2019-12-22 ENCOUNTER — Other Ambulatory Visit: Payer: Self-pay | Admitting: Surgical

## 2019-12-22 ENCOUNTER — Encounter: Payer: Self-pay | Admitting: *Deleted

## 2019-12-22 LAB — SURGICAL PATHOLOGY

## 2019-12-22 MED ORDER — HYDROCODONE-ACETAMINOPHEN 5-325 MG PO TABS: 1.0000 | ORAL_TABLET | Freq: Three times a day (TID) | ORAL | 0 refills | Status: AC | PRN

## 2019-12-22 MED ORDER — IBUPROFEN 600 MG PO TABS: 600.0000 mg | ORAL_TABLET | Freq: Three times a day (TID) | ORAL | 0 refills | Status: DC | PRN

## 2019-12-22 MED ORDER — ACETAMINOPHEN 500 MG PO TABS: 500.0000 mg | ORAL_TABLET | Freq: Four times a day (QID) | ORAL | 0 refills | Status: DC | PRN

## 2019-12-22 NOTE — Telephone Encounter (Signed)
Completed.

## 2019-12-22 NOTE — Progress Notes (Signed)
Post op pain meds

## 2019-12-27 ENCOUNTER — Ambulatory Visit: Payer: 59

## 2019-12-27 ENCOUNTER — Other Ambulatory Visit: Payer: Self-pay | Admitting: Medical

## 2019-12-27 ENCOUNTER — Other Ambulatory Visit: Payer: 59

## 2019-12-27 DIAGNOSIS — C50412 Malignant neoplasm of upper-outer quadrant of left female breast: Secondary | ICD-10-CM

## 2019-12-27 DIAGNOSIS — Z170 Estrogen receptor positive status [ER+]: Secondary | ICD-10-CM

## 2019-12-28 ENCOUNTER — Inpatient Hospital Stay: Payer: 59

## 2019-12-28 ENCOUNTER — Inpatient Hospital Stay (HOSPITAL_BASED_OUTPATIENT_CLINIC_OR_DEPARTMENT_OTHER): Payer: 59 | Admitting: Medical

## 2019-12-28 ENCOUNTER — Encounter: Payer: Self-pay | Admitting: Licensed Clinical Social Worker

## 2019-12-28 ENCOUNTER — Other Ambulatory Visit: Payer: Self-pay

## 2019-12-28 VITALS — BP 150/63 | HR 95 | Temp 97.7°F | Resp 18 | Ht 64.0 in | Wt 237.0 lb

## 2019-12-28 DIAGNOSIS — Z170 Estrogen receptor positive status [ER+]: Secondary | ICD-10-CM | POA: Diagnosis not present

## 2019-12-28 DIAGNOSIS — C50412 Malignant neoplasm of upper-outer quadrant of left female breast: Secondary | ICD-10-CM | POA: Diagnosis not present

## 2019-12-28 DIAGNOSIS — Z5111 Encounter for antineoplastic chemotherapy: Secondary | ICD-10-CM | POA: Diagnosis not present

## 2019-12-28 LAB — CMP (CANCER CENTER ONLY)
ALT: 36 U/L (ref 0–44)
AST: 19 U/L (ref 15–41)
Albumin: 3 g/dL — ABNORMAL LOW (ref 3.5–5.0)
Alkaline Phosphatase: 110 U/L (ref 38–126)
Anion gap: 9 (ref 5–15)
BUN: 13 mg/dL (ref 6–20)
CO2: 27 mmol/L (ref 22–32)
Calcium: 8.7 mg/dL — ABNORMAL LOW (ref 8.9–10.3)
Chloride: 106 mmol/L (ref 98–111)
Creatinine: 0.83 mg/dL (ref 0.44–1.00)
GFR, Estimated: >60 mL/min (ref >60)
Glucose, Bld: 162 mg/dL — ABNORMAL HIGH (ref 70–99)
Potassium: 3.6 mmol/L (ref 3.5–5.1)
Sodium: 142 mmol/L (ref 135–145)
Total Bilirubin: 0.5 mg/dL (ref 0.3–1.2)
Total Protein: 7.1 g/dL (ref 6.5–8.1)

## 2019-12-28 LAB — CBC WITH DIFFERENTIAL (CANCER CENTER ONLY)
Abs Immature Granulocytes: 0.03 10*3/uL (ref 0.00–0.07)
Basophils Absolute: 0 10*3/uL (ref 0.0–0.1)
Basophils Relative: 1 %
Eosinophils Absolute: 0.1 10*3/uL (ref 0.0–0.5)
Eosinophils Relative: 3 %
HCT: 38.2 % (ref 36.0–46.0)
Hemoglobin: 12.1 g/dL (ref 12.0–15.0)
Immature Granulocytes: 1 %
Lymphocytes Relative: 34 %
Lymphs Abs: 1.7 10*3/uL (ref 0.7–4.0)
MCH: 27.3 pg (ref 26.0–34.0)
MCHC: 31.7 g/dL (ref 30.0–36.0)
MCV: 86 fL (ref 80.0–100.0)
Monocytes Absolute: 0.4 10*3/uL (ref 0.1–1.0)
Monocytes Relative: 8 %
Neutro Abs: 2.8 10*3/uL (ref 1.7–7.7)
Neutrophils Relative %: 53 %
Platelet Count: 202 10*3/uL (ref 150–400)
RBC: 4.44 MIL/uL (ref 3.87–5.11)
RDW: 15.9 % — ABNORMAL HIGH (ref 11.5–15.5)
WBC Count: 5.1 10*3/uL (ref 4.0–10.5)
nRBC: 0 % (ref 0.0–0.2)

## 2019-12-28 MED ORDER — FLUOROURACIL CHEMO INJECTION 2.5 GM/50ML
600.0000 mg/m2 | Freq: Once | INTRAVENOUS | Status: AC
  Administered 2019-12-28: 1350 mg via INTRAVENOUS
  Filled 2019-12-28: qty 27

## 2019-12-28 MED ORDER — SODIUM CHLORIDE 0.9 % IV SOLN
10.0000 mg | Freq: Once | INTRAVENOUS | Status: AC
  Administered 2019-12-28: 10 mg via INTRAVENOUS
  Filled 2019-12-28: qty 10

## 2019-12-28 MED ORDER — PALONOSETRON HCL INJECTION 0.25 MG/5ML
0.2500 mg | Freq: Once | INTRAVENOUS | Status: AC
  Administered 2019-12-28: 0.25 mg via INTRAVENOUS

## 2019-12-28 MED ORDER — PALONOSETRON HCL INJECTION 0.25 MG/5ML
INTRAVENOUS | Status: AC
  Filled 2019-12-28: qty 5

## 2019-12-28 MED ORDER — SODIUM CHLORIDE 0.9 % IV SOLN
600.0000 mg/m2 | Freq: Once | INTRAVENOUS | Status: AC
  Administered 2019-12-28: 1340 mg via INTRAVENOUS
  Filled 2019-12-28: qty 67

## 2019-12-28 MED ORDER — SODIUM CHLORIDE 0.9 % IV SOLN
Freq: Once | INTRAVENOUS | Status: AC
  Filled 2019-12-28: qty 250

## 2019-12-28 MED ORDER — METHOTREXATE SODIUM (PF) CHEMO INJECTION 250 MG/10ML
40.0000 mg/m2 | Freq: Once | INTRAMUSCULAR | Status: AC
  Administered 2019-12-28: 89.25 mg via INTRAVENOUS
  Filled 2019-12-28: qty 3.57

## 2019-12-28 NOTE — Patient Instructions (Signed)
[facility] Discharge Instructions for Patients Receiving Chemotherapy  Today you received the following chemotherapy agents: cyclophosphamide, methotrexate, fluorouracil  To help prevent nausea and vomiting after your treatment, we encourage you to take your nausea medication as needed.   If you develop nausea and vomiting that is not controlled by your nausea medication, call the clinic.   BELOW ARE SYMPTOMS THAT SHOULD BE REPORTED IMMEDIATELY:  *FEVER GREATER THAN 100.5 F  *CHILLS WITH OR WITHOUT FEVER  NAUSEA AND VOMITING THAT IS NOT CONTROLLED WITH YOUR NAUSEA MEDICATION  *UNUSUAL SHORTNESS OF BREATH  *UNUSUAL BRUISING OR BLEEDING  TENDERNESS IN MOUTH AND THROAT WITH OR WITHOUT PRESENCE OF ULCERS  *URINARY PROBLEMS  *BOWEL PROBLEMS  UNUSUAL RASH Items with * indicate a potential emergency and should be followed up as soon as possible.  Feel free to call the clinic should you have any questions or concerns. The clinic phone number is [phone].  Please show the [facility] at check-in to the Emergency Department and triage nurse.  Cyclophosphamide Injection What is this medicine? CYCLOPHOSPHAMIDE (sye kloe FOSS fa mide) is a chemotherapy drug. It slows the growth of cancer cells. This medicine is used to treat many types of cancer like lymphoma, myeloma, leukemia, breast cancer, and ovarian cancer, to name a few. This medicine may be used for other purposes; ask your health care provider or pharmacist if you have questions. COMMON BRAND NAME(S): Cytoxan, Neosar What should I tell my health care provider before I take this medicine? They need to know if you have any of these conditions:  heart disease  history of irregular heartbeat  infection  kidney disease  liver disease  low blood counts, like white cells, platelets, or red blood cells  on hemodialysis  recent or ongoing radiation therapy  scarring or thickening of the  lungs  trouble passing urine  an unusual or allergic reaction to cyclophosphamide, other medicines, foods, dyes, or preservatives  pregnant or trying to get pregnant  breast-feeding How should I use this medicine? This drug is usually given as an injection into a vein or muscle or by infusion into a vein. It is administered in a hospital or clinic by a specially trained health care professional. Talk to your pediatrician regarding the use of this medicine in children. Special care may be needed. Overdosage: If you think you have taken too much of this medicine contact a poison control center or emergency room at once. NOTE: This medicine is only for you. Do not share this medicine with others. What if I miss a dose? It is important not to miss your dose. Call your doctor or health care professional if you are unable to keep an appointment. What may interact with this medicine?  amphotericin B  azathioprine  certain antivirals for HIV or hepatitis  certain medicines for blood pressure, heart disease, irregular heart beat  certain medicines that treat or prevent blood clots like warfarin  certain other medicines for cancer  cyclosporine  etanercept  indomethacin  medicines that relax muscles for surgery  medicines to increase blood counts  metronidazole This list may not describe all possible interactions. Give your health care provider a list of all the medicines, herbs, non-prescription drugs, or dietary supplements you use. Also tell them if you smoke, drink alcohol, or use illegal drugs. Some items may interact with your medicine. What should I watch for while using this medicine? Your condition will be monitored carefully while you are receiving this  medicine. You may need blood work done while you are taking this medicine. Drink water or other fluids as directed. Urinate often, even at night. Some products may contain alcohol. Ask your health care professional if  this medicine contains alcohol. Be sure to tell all health care professionals you are taking this medicine. Certain medicines, like metronidazole and disulfiram, can cause an unpleasant reaction when taken with alcohol. The reaction includes flushing, headache, nausea, vomiting, sweating, and increased thirst. The reaction can last from 30 minutes to several hours. Do not become pregnant while taking this medicine or for 1 year after stopping it. Women should inform their health care professional if they wish to become pregnant or think they might be pregnant. Men should not father a child while taking this medicine and for 4 months after stopping it. There is potential for serious side effects to an unborn child. Talk to your health care professional for more information. Do not breast-feed an infant while taking this medicine or for 1 week after stopping it. This medicine has caused ovarian failure in some women. This medicine may make it more difficult to get pregnant. Talk to your health care professional if you are concerned about your fertility. This medicine has caused decreased sperm counts in some men. This may make it more difficult to father a child. Talk to your health care professional if you are concerned about your fertility. Call your health care professional for advice if you get a fever, chills, or sore throat, or other symptoms of a cold or flu. Do not treat yourself. This medicine decreases your body's ability to fight infections. Try to avoid being around people who are sick. Avoid taking medicines that contain aspirin, acetaminophen, ibuprofen, naproxen, or ketoprofen unless instructed by your health care professional. These medicines may hide a fever. Talk to your health care professional about your risk of cancer. You may be more at risk for certain types of cancer if you take this medicine. If you are going to need surgery or other procedure, tell your health care professional that  you are using this medicine. Be careful brushing or flossing your teeth or using a toothpick because you may get an infection or bleed more easily. If you have any dental work done, tell your dentist you are receiving this medicine. What side effects may I notice from receiving this medicine? Side effects that you should report to your doctor or health care professional as soon as possible:  allergic reactions like skin rash, itching or hives, swelling of the face, lips, or tongue  breathing problems  nausea, vomiting  signs and symptoms of bleeding such as bloody or black, tarry stools; red or dark brown urine; spitting up blood or brown material that looks like coffee grounds; red spots on the skin; unusual bruising or bleeding from the eyes, gums, or nose  signs and symptoms of heart failure like fast, irregular heartbeat, sudden weight gain; swelling of the ankles, feet, hands  signs and symptoms of infection like fever; chills; cough; sore throat; pain or trouble passing urine  signs and symptoms of kidney injury like trouble passing urine or change in the amount of urine  signs and symptoms of liver injury like dark yellow or brown urine; general ill feeling or flu-like symptoms; light-colored stools; loss of appetite; nausea; right upper belly pain; unusually weak or tired; yellowing of the eyes or skin Side effects that usually do not require medical attention (report to your doctor or health care  professional if they continue or are bothersome):  confusion  decreased hearing  diarrhea  facial flushing  hair loss  headache  loss of appetite  missed menstrual periods  signs and symptoms of low red blood cells or anemia such as unusually weak or tired; feeling faint or lightheaded; falls  skin discoloration This list may not describe all possible side effects. Call your doctor for medical advice about side effects. You may report side effects to FDA at  1-800-FDA-1088. Where should I keep my medicine? This drug is given in a hospital or clinic and will not be stored at home. NOTE: This sheet is a summary. It may not cover all possible information. If you have questions about this medicine, talk to your doctor, pharmacist, or health care provider.  2020 Elsevier/Gold Standard (2018-11-08 09:53:29)  Methotrexate injection What is this medicine? METHOTREXATE (METH oh TREX ate) is a chemotherapy drug used to treat cancer including breast cancer, leukemia, and lymphoma. This medicine can also be used to treat psoriasis and certain kinds of arthritis. This medicine may be used for other purposes; ask your health care provider or pharmacist if you have questions. What should I tell my health care provider before I take this medicine? They need to know if you have any of these conditions:  fluid in the stomach area or lungs  if you often drink alcohol  infection or immune system problems  kidney disease  liver disease  low blood counts, like low white cell, platelet, or red cell counts  lung disease  radiation therapy  stomach ulcers  ulcerative colitis  an unusual or allergic reaction to methotrexate, other medicines, foods, dyes, or preservatives  pregnant or trying to get pregnant  breast-feeding How should I use this medicine? This medicine is for infusion into a vein or for injection into muscle or into the spinal fluid (whichever applies). It is usually given by a health care professional in a hospital or clinic setting. In rare cases, you might get this medicine at home. You will be taught how to give this medicine. Use exactly as directed. Take your medicine at regular intervals. Do not take your medicine more often than directed. If this medicine is used for arthritis or psoriasis, it should be taken weekly, NOT daily. It is important that you put your used needles and syringes in a special sharps container. Do not put  them in a trash can. If you do not have a sharps container, call your pharmacist or healthcare provider to get one. Talk to your pediatrician regarding the use of this medicine in children. While this drug may be prescribed for children as young as 2 years for selected conditions, precautions do apply. Overdosage: If you think you have taken too much of this medicine contact a poison control center or emergency room at once. NOTE: This medicine is only for you. Do not share this medicine with others. What if I miss a dose? It is important not to miss your dose. Call your doctor or health care professional if you are unable to keep an appointment. If you give yourself the medicine and you miss a dose, talk with your doctor or health care professional. Do not take double or extra doses. What may interact with this medicine? This medicine may interact with the following medications:  acitretin  aspirin or aspirin-like medicines including salicylates  azathioprine  certain antibiotics like chloramphenicol, penicillin, tetracycline  certain medicines for stomach problems like esomeprazole, omeprazole, pantoprazole  cyclosporine  gold  hydroxychloroquine  live virus vaccines  mercaptopurine  NSAIDs, medicines for pain and inflammation, like ibuprofen or naproxen  other cytotoxic agents  penicillamine  phenylbutazone  phenytoin  probenacid  retinoids such as isotretinoin and tretinoin  steroid medicines like prednisone or cortisone  sulfonamides like sulfasalazine and trimethoprim/sulfamethoxazole  theophylline This list may not describe all possible interactions. Give your health care provider a list of all the medicines, herbs, non-prescription drugs, or dietary supplements you use. Also tell them if you smoke, drink alcohol, or use illegal drugs. Some items may interact with your medicine. What should I watch for while using this medicine? Avoid alcoholic drinks. In  some cases, you may be given additional medicines to help with side effects. Follow all directions for their use. This medicine can make you more sensitive to the sun. Keep out of the sun. If you cannot avoid being in the sun, wear protective clothing and use sunscreen. Do not use sun lamps or tanning beds/booths. You may get drowsy or dizzy. Do not drive, use machinery, or do anything that needs mental alertness until you know how this medicine affects you. Do not stand or sit up quickly, especially if you are an older patient. This reduces the risk of dizzy or fainting spells. You may need blood work done while you are taking this medicine. Call your doctor or health care professional for advice if you get a fever, chills or sore throat, or other symptoms of a cold or flu. Do not treat yourself. This drug decreases your body's ability to fight infections. Try to avoid being around people who are sick. This medicine may increase your risk to bruise or bleed. Call your doctor or health care professional if you notice any unusual bleeding. Check with your doctor or health care professional if you get an attack of severe diarrhea, nausea and vomiting, or if you sweat a lot. The loss of too much body fluid can make it dangerous for you to take this medicine. Talk to your doctor about your risk of cancer. You may be more at risk for certain types of cancers if you take this medicine. Both men and women must use effective birth control with this medicine. Do not become pregnant while taking this medicine or until at least 1 normal menstrual cycle has occurred after stopping it. Women should inform their doctor if they wish to become pregnant or think they might be pregnant. Men should not father a child while taking this medicine and for 3 months after stopping it. There is a potential for serious side effects to an unborn child. Talk to your health care professional or pharmacist for more information. Do not  breast-feed an infant while taking this medicine. What side effects may I notice from receiving this medicine? Side effects that you should report to your doctor or health care professional as soon as possible:  allergic reactions like skin rash, itching or hives, swelling of the face, lips, or tongue  back pain  breathing problems or shortness of breath  confusion  diarrhea  dry, nonproductive cough  low blood counts - this medicine may decrease the number of white blood cells, red blood cells and platelets. You may be at increased risk of infections and bleeding  mouth sores  redness, blistering, peeling or loosening of the skin, including inside the mouth  seizures  severe headaches  signs of infection - fever or chills, cough, sore throat, pain or difficulty passing urine  signs and  symptoms of bleeding such as bloody or black, tarry stools; red or dark-brown urine; spitting up blood or brown material that looks like coffee grounds; red spots on the skin; unusual bruising or bleeding from the eye, gums, or nose  signs and symptoms of kidney injury like trouble passing urine or change in the amount of urine  signs and symptoms of liver injury like dark yellow or brown urine; general ill feeling or flu-like symptoms; light-colored stools; loss of appetite; nausea; right upper belly pain; unusually weak or tired; yellowing of the eyes or skin  stiff neck  vomiting Side effects that usually do not require medical attention (report to your doctor or health care professional if they continue or are bothersome):  dizziness  hair loss  headache  stomach pain  upset stomach This list may not describe all possible side effects. Call your doctor for medical advice about side effects. You may report side effects to FDA at 1-800-FDA-1088. Where should I keep my medicine? If you are using this medicine at home, you will be instructed on how to store this medicine. Throw away  any unused medicine after the expiration date on the label. NOTE: This sheet is a summary. It may not cover all possible information. If you have questions about this medicine, talk to your doctor, pharmacist, or health care provider.  2020 Elsevier/Gold Standard (2016-09-25 13:31:42)  Fluorouracil, 5-FU injection What is this medicine? FLUOROURACIL, 5-FU (flure oh YOOR a sil) is a chemotherapy drug. It slows the growth of cancer cells. This medicine is used to treat many types of cancer like breast cancer, colon or rectal cancer, pancreatic cancer, and stomach cancer. This medicine may be used for other purposes; ask your health care provider or pharmacist if you have questions. COMMON BRAND NAME(S): Adrucil What should I tell my health care provider before I take this medicine? They need to know if you have any of these conditions:  blood disorders  dihydropyrimidine dehydrogenase (DPD) deficiency  infection (especially a virus infection such as chickenpox, cold sores, or herpes)  kidney disease  liver disease  malnourished, poor nutrition  recent or ongoing radiation therapy  an unusual or allergic reaction to fluorouracil, other chemotherapy, other medicines, foods, dyes, or preservatives  pregnant or trying to get pregnant  breast-feeding How should I use this medicine? This drug is given as an infusion or injection into a vein. It is administered in a hospital or clinic by a specially trained health care professional. Talk to your pediatrician regarding the use of this medicine in children. Special care may be needed. Overdosage: If you think you have taken too much of this medicine contact a poison control center or emergency room at once. NOTE: This medicine is only for you. Do not share this medicine with others. What if I miss a dose? It is important not to miss your dose. Call your doctor or health care professional if you are unable to keep an appointment. What may  interact with this medicine?  allopurinol  cimetidine  dapsone  digoxin  hydroxyurea  leucovorin  levamisole  medicines for seizures like ethotoin, fosphenytoin, phenytoin  medicines to increase blood counts like filgrastim, pegfilgrastim, sargramostim  medicines that treat or prevent blood clots like warfarin, enoxaparin, and dalteparin  methotrexate  metronidazole  pyrimethamine  some other chemotherapy drugs like busulfan, cisplatin, estramustine, vinblastine  trimethoprim  trimetrexate  vaccines Talk to your doctor or health care professional before taking any of these medicines:  acetaminophen  aspirin  ibuprofen  ketoprofen  naproxen This list may not describe all possible interactions. Give your health care provider a list of all the medicines, herbs, non-prescription drugs, or dietary supplements you use. Also tell them if you smoke, drink alcohol, or use illegal drugs. Some items may interact with your medicine. What should I watch for while using this medicine? Visit your doctor for checks on your progress. This drug may make you feel generally unwell. This is not uncommon, as chemotherapy can affect healthy cells as well as cancer cells. Report any side effects. Continue your course of treatment even though you feel ill unless your doctor tells you to stop. In some cases, you may be given additional medicines to help with side effects. Follow all directions for their use. Call your doctor or health care professional for advice if you get a fever, chills or sore throat, or other symptoms of a cold or flu. Do not treat yourself. This drug decreases your body's ability to fight infections. Try to avoid being around people who are sick. This medicine may increase your risk to bruise or bleed. Call your doctor or health care professional if you notice any unusual bleeding. Be careful brushing and flossing your teeth or using a toothpick because you may get an  infection or bleed more easily. If you have any dental work done, tell your dentist you are receiving this medicine. Avoid taking products that contain aspirin, acetaminophen, ibuprofen, naproxen, or ketoprofen unless instructed by your doctor. These medicines may hide a fever. Do not become pregnant while taking this medicine. Women should inform their doctor if they wish to become pregnant or think they might be pregnant. There is a potential for serious side effects to an unborn child. Talk to your health care professional or pharmacist for more information. Do not breast-feed an infant while taking this medicine. Men should inform their doctor if they wish to father a child. This medicine may lower sperm counts. Do not treat diarrhea with over the counter products. Contact your doctor if you have diarrhea that lasts more than 2 days or if it is severe and watery. This medicine can make you more sensitive to the sun. Keep out of the sun. If you cannot avoid being in the sun, wear protective clothing and use sunscreen. Do not use sun lamps or tanning beds/booths. What side effects may I notice from receiving this medicine? Side effects that you should report to your doctor or health care professional as soon as possible:  allergic reactions like skin rash, itching or hives, swelling of the face, lips, or tongue  low blood counts - this medicine may decrease the number of white blood cells, red blood cells and platelets. You may be at increased risk for infections and bleeding.  signs of infection - fever or chills, cough, sore throat, pain or difficulty passing urine  signs of decreased platelets or bleeding - bruising, pinpoint red spots on the skin, black, tarry stools, blood in the urine  signs of decreased red blood cells - unusually weak or tired, fainting spells, lightheadedness  breathing problems  changes in vision  chest pain  mouth sores  nausea and vomiting  pain, swelling,  redness at site where injected  pain, tingling, numbness in the hands or feet  redness, swelling, or sores on hands or feet  stomach pain  unusual bleeding Side effects that usually do not require medical attention (report to your doctor or health care professional if they  continue or are bothersome):  changes in finger or toe nails  diarrhea  dry or itchy skin  hair loss  headache  loss of appetite  sensitivity of eyes to the light  stomach upset  unusually teary eyes This list may not describe all possible side effects. Call your doctor for medical advice about side effects. You may report side effects to FDA at 1-800-FDA-1088. Where should I keep my medicine? This drug is given in a hospital or clinic and will not be stored at home. NOTE: This sheet is a summary. It may not cover all possible information. If you have questions about this medicine, talk to your doctor, pharmacist, or health care provider.  2020 Elsevier/Gold Standard (2007-06-09 13:53:16)

## 2019-12-28 NOTE — Progress Notes (Signed)
OK to treat.  Sayward Horvath, MHS, PA-C 

## 2019-12-28 NOTE — Progress Notes (Signed)
[facility] CSW Progress Note  [employer] representative mailed information on breast cancer foundations, including applications for [employer] in [place] as CSW has not been able to see pt in person due to scheduling changes.   Christeen Douglas , LCSW
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
## 2019-12-30 ENCOUNTER — Other Ambulatory Visit: Payer: Self-pay

## 2019-12-30 ENCOUNTER — Ambulatory Visit (INDEPENDENT_AMBULATORY_CARE_PROVIDER_SITE_OTHER): Payer: 59 | Admitting: Plastic Surgery

## 2019-12-30 ENCOUNTER — Encounter: Payer: Self-pay | Admitting: Plastic Surgery

## 2019-12-30 VITALS — BP 125/65 | HR 85 | Temp 98.8°F

## 2019-12-30 DIAGNOSIS — N651 Disproportion of reconstructed breast: Secondary | ICD-10-CM

## 2019-12-30 DIAGNOSIS — Z9012 Acquired absence of left breast and nipple: Secondary | ICD-10-CM

## 2019-12-30 DIAGNOSIS — Z170 Estrogen receptor positive status [ER+]: Secondary | ICD-10-CM

## 2019-12-30 DIAGNOSIS — C50412 Malignant neoplasm of upper-outer quadrant of left female breast: Secondary | ICD-10-CM

## 2019-12-30 NOTE — Progress Notes (Signed)
   Subjective:    Patient ID: Lauren Garrison, female    DOB: 11-20-1960, 59 y.o.   MRN: [id number]  The patient is a very sweet 59 year old female follow-up after removal of her left breast expander.  She had some issues with and decided to have it removed and not replaced.  She seems happy with her decision.  The drain is in and draining as expected.  I will plan to get that out next week.  The patient would like to move ahead with a reduction of the right breast so that it will be a lot easier for her to fit into a bra and into close.  I think that is very reasonable.    Review of Systems  Constitutional: Negative.   HENT: Negative.   Eyes: Negative.   Respiratory: Negative.   Cardiovascular: Negative.   Genitourinary: Negative.   Musculoskeletal: Negative.        Objective:   Physical Exam Vitals and nursing note reviewed.  Constitutional:      Appearance: Normal appearance.  HENT:     Head: Normocephalic and atraumatic.  Cardiovascular:     Rate and Rhythm: Normal rate.     Pulses: Normal pulses.  Pulmonary:     Effort: Pulmonary effort is normal.  Neurological:     General: No focal deficit present.     Mental Status: She is alert. Mental status is at baseline.  Psychiatric:        Mood and Affect: Mood normal.        Behavior: Behavior normal.         Assessment & Plan:     ICD-10-CM   1. Acquired absence of left breast  Z90.12   2. Malignant neoplasm of upper-outer quadrant of left breast in female, estrogen receptor positive ([place])  C50.412    Z17.0   3. Breast asymmetry following reconstructive surgery  N65.1     We will plan to remove the left breast drain next week.  Also plan on a right breast mastopexy reduction for improved symmetry.  Pictures were obtained of the patient and placed in the chart with the patient's or guardian's permission.

## 2020-01-01 NOTE — Progress Notes (Signed)
Patient is a 59 year old female here for follow-up after undergoing removal of left breast tissue expander on 12/21/2019 with Dr. Marla Roe.  She would like to move ahead with reduction of the right breast so would make it easier for her to fit into a bra.  ~2 weeks PO Patient reports she is doing well.  Has no concerns.  Denies fever/chills, nausea/vomiting, breast pain.  Drain output has been approximately 20 cc/day for the last several days.  Incision is intact, clean and dry.  Steri-Strips in place.  There is no signs of infection, redness, drainage, seroma/hematoma.  Drain removed today.  She should apply Vaseline and gauze daily to the drain site for the next several days until closed.  She may shower normally.  Follow-up in 3 to 4 weeks.  Return precautions given.  Call office with any questions/concerns.

## 2020-01-03 ENCOUNTER — Other Ambulatory Visit: Payer: Self-pay | Admitting: *Deleted

## 2020-01-04 ENCOUNTER — Inpatient Hospital Stay (HOSPITAL_BASED_OUTPATIENT_CLINIC_OR_DEPARTMENT_OTHER): Payer: 59 | Admitting: Medical

## 2020-01-04 ENCOUNTER — Other Ambulatory Visit: Payer: Self-pay | Admitting: Medical

## 2020-01-04 ENCOUNTER — Other Ambulatory Visit: Payer: Self-pay

## 2020-01-04 ENCOUNTER — Encounter: Payer: Self-pay | Admitting: Plastic Surgery

## 2020-01-04 ENCOUNTER — Ambulatory Visit (INDEPENDENT_AMBULATORY_CARE_PROVIDER_SITE_OTHER): Payer: 59 | Admitting: Plastic Surgery

## 2020-01-04 VITALS — BP 153/83 | HR 91 | Temp 97.8°F | Resp 17 | Ht 64.0 in | Wt 237.7 lb

## 2020-01-04 VITALS — BP 139/68 | HR 79 | Temp 98.9°F

## 2020-01-04 DIAGNOSIS — Z9012 Acquired absence of left breast and nipple: Secondary | ICD-10-CM

## 2020-01-04 DIAGNOSIS — C50412 Malignant neoplasm of upper-outer quadrant of left female breast: Secondary | ICD-10-CM | POA: Diagnosis not present

## 2020-01-04 DIAGNOSIS — Z5111 Encounter for antineoplastic chemotherapy: Secondary | ICD-10-CM | POA: Diagnosis not present

## 2020-01-04 DIAGNOSIS — Z170 Estrogen receptor positive status [ER+]: Secondary | ICD-10-CM | POA: Diagnosis not present

## 2020-01-04 LAB — CBC WITH DIFFERENTIAL (CANCER CENTER ONLY)
Abs Immature Granulocytes: 0.02 10*3/uL (ref 0.00–0.07)
Basophils Absolute: 0 10*3/uL (ref 0.0–0.1)
Basophils Relative: 0 %
Eosinophils Absolute: 0.2 10*3/uL (ref 0.0–0.5)
Eosinophils Relative: 4 %
HCT: 34.2 % — ABNORMAL LOW (ref 36.0–46.0)
Hemoglobin: 10.9 g/dL — ABNORMAL LOW (ref 12.0–15.0)
Immature Granulocytes: 0 %
Lymphocytes Relative: 32 %
Lymphs Abs: 1.6 10*3/uL (ref 0.7–4.0)
MCH: 27.4 pg (ref 26.0–34.0)
MCHC: 31.9 g/dL (ref 30.0–36.0)
MCV: 85.9 fL (ref 80.0–100.0)
Monocytes Absolute: 0.4 10*3/uL (ref 0.1–1.0)
Monocytes Relative: 8 %
Neutro Abs: 2.7 10*3/uL (ref 1.7–7.7)
Neutrophils Relative %: 56 %
Platelet Count: 167 10*3/uL (ref 150–400)
RBC: 3.98 MIL/uL (ref 3.87–5.11)
RDW: 15.9 % — ABNORMAL HIGH (ref 11.5–15.5)
WBC Count: 4.9 10*3/uL (ref 4.0–10.5)
nRBC: 0 % (ref 0.0–0.2)

## 2020-01-04 LAB — CMP (CANCER CENTER ONLY)
ALT: 17 U/L (ref 0–44)
AST: 9 U/L — ABNORMAL LOW (ref 15–41)
Albumin: 3.2 g/dL — ABNORMAL LOW (ref 3.5–5.0)
Alkaline Phosphatase: 102 U/L (ref 38–126)
Anion gap: 8 (ref 5–15)
BUN: 15 mg/dL (ref 6–20)
CO2: 29 mmol/L (ref 22–32)
Calcium: 8.8 mg/dL — ABNORMAL LOW (ref 8.9–10.3)
Chloride: 104 mmol/L (ref 98–111)
Creatinine: 0.87 mg/dL (ref 0.44–1.00)
GFR, Estimated: >60 mL/min (ref >60)
Glucose, Bld: 128 mg/dL — ABNORMAL HIGH (ref 70–99)
Potassium: 3.9 mmol/L (ref 3.5–5.1)
Sodium: 141 mmol/L (ref 135–145)
Total Bilirubin: 0.5 mg/dL (ref 0.3–1.2)
Total Protein: 6.9 g/dL (ref 6.5–8.1)

## 2020-01-04 NOTE — Progress Notes (Signed)
These results were reviewed with the patient.
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
## 2020-01-10 ENCOUNTER — Encounter: Payer: Self-pay | Admitting: Plastic Surgery

## 2020-01-10 ENCOUNTER — Encounter: Payer: 59 | Admitting: Surgical

## 2020-01-17 ENCOUNTER — Other Ambulatory Visit: Payer: 59

## 2020-01-17 ENCOUNTER — Ambulatory Visit: Payer: 59

## 2020-01-18 ENCOUNTER — Telehealth: Payer: Self-pay | Admitting: *Deleted

## 2020-01-18 ENCOUNTER — Ambulatory Visit: Payer: 59 | Admitting: Internal Medicine

## 2020-01-18 ENCOUNTER — Other Ambulatory Visit: Payer: Self-pay | Admitting: *Deleted

## 2020-01-18 MED ORDER — FLUCONAZOLE 100 MG PO TABS: 100.0000 mg | ORAL_TABLET | Freq: Every day | ORAL | 0 refills | Status: DC

## 2020-01-18 NOTE — Telephone Encounter (Signed)
Received vm call from pt stating she has a yeast infection from her meds & asked for script for Diflucan to be sent to [facility] on [address]. Returned call to pt & she reports vaginal itching & discharge.  She was on ATB a few weeks ago.  Message to Dr Jana Hakim.
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
## 2020-01-25 ENCOUNTER — Encounter: Payer: Self-pay | Admitting: Plastic Surgery

## 2020-01-25 ENCOUNTER — Other Ambulatory Visit: Payer: Self-pay

## 2020-01-25 ENCOUNTER — Ambulatory Visit (INDEPENDENT_AMBULATORY_CARE_PROVIDER_SITE_OTHER): Payer: 59 | Admitting: Plastic Surgery

## 2020-01-25 VITALS — BP 136/71 | HR 106 | Temp 98.8°F

## 2020-01-25 DIAGNOSIS — N651 Disproportion of reconstructed breast: Secondary | ICD-10-CM

## 2020-01-25 DIAGNOSIS — Z9012 Acquired absence of left breast and nipple: Secondary | ICD-10-CM

## 2020-01-25 MED ORDER — IBUPROFEN 100 MG/5ML PO SUSP: 600.0000 mg | Freq: Four times a day (QID) | ORAL | 6 refills | Status: AC | PRN

## 2020-01-25 MED ORDER — HYDROCODONE-ACETAMINOPHEN 5-325 MG PO TABS: 1.0000 | ORAL_TABLET | Freq: Three times a day (TID) | ORAL | 0 refills | Status: AC | PRN

## 2020-01-25 MED ORDER — CEPHALEXIN 500 MG PO CAPS: 500.0000 mg | ORAL_CAPSULE | Freq: Four times a day (QID) | ORAL | 0 refills | Status: AC

## 2020-01-25 MED ORDER — ACETAMINOPHEN 500 MG PO TABS: 500.0000 mg | ORAL_TABLET | Freq: Four times a day (QID) | ORAL | 0 refills | Status: DC | PRN

## 2020-01-25 MED ORDER — ONDANSETRON HCL 4 MG PO TABS: 4.0000 mg | ORAL_TABLET | Freq: Three times a day (TID) | ORAL | 0 refills | Status: DC | PRN

## 2020-01-30 ENCOUNTER — Other Ambulatory Visit: Payer: Self-pay | Admitting: *Deleted

## 2020-01-30 DIAGNOSIS — Z170 Estrogen receptor positive status [ER+]: Secondary | ICD-10-CM

## 2020-01-30 DIAGNOSIS — C50412 Malignant neoplasm of upper-outer quadrant of left female breast: Secondary | ICD-10-CM

## 2020-01-30 NOTE — Progress Notes (Signed)
[facility]  [phone] [phone]     ID: Lauren Garrison DOB: [birth year]  MR#: [id number]  OBS#:[id number]  Patient Care Team: Isaac Bliss, Rayford Halsted, MD as PCP - General (Internal Medicine) Mauro Kaufmann, RN as Oncology Nurse Navigator Rockwell Germany, RN as Oncology Nurse Navigator Chandlor Noecker, Virgie Dad, MD as Consulting Physician (Oncology) Jovita Kussmaul, MD as Consulting Physician (General Surgery) Eppie Gibson, MD as Attending Physician (Radiation Oncology) Tamela Gammon, NP as Nurse Practitioner (Gynecology) Frederik Pear, MD as Consulting Physician (Orthopedic Surgery) Dillingham, [name], DO as Attending Physician (Plastic Surgery) Chauncey Cruel, MD OTHER MD:  CHIEF COMPLAINT: 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    Following tissue expander placement, she developed cellulitis, which was treated with Cipro. She ultimately had the expanders removed on 12/21/2019.  She began adjuvant chemotherapy with cyclophosphamide, methotrexate, and fluorouracil (CMF) on 12/28/2019.  Subsequent cycles have been delayed because of the intercurrent infection but she feels ready to go on with treatment today.   REVIEW OF SYSTEMS: Miyu denies any unusual headaches visual changes cough phlegm production pleurisy shortness of breath or change in bowel or bladder habits.  A detailed review of systems was otherwise stable.   COVID 19 VACCINATION STATUS:    HISTORY OF CURRENT ILLNESS: From the original intake note:  Lauren Garrison has a prior history of left breast cancer for which she underwent a left breast lumpectomy in 2005.   More recently she noted a palpable left breast lump. She underwent bilateral diagnostic mammography  with tomography and bilateral breast ultrasonography at [facility] on 08/15/2019 showing: Breast Density Category C. In the left breast, there is a suspicious obscured mass containing pleomorphic and fine linear calcifications in the upper-outer left breast, just anterior to the patient's previous lumpectomy site. On physical exam, there is a palpable lump in the upper outer left breast. Sonographically, there is a suspicious irregular hypoechoic mass with internal blood flow in the left breast at 2 o'clock, 3 cm from the nipple measuring 3.7 x 1.7 by 2.7 cm. There is a single mildly abnormal node in the left axilla.  In the right breast, there are obscured masses in the medial right breast. Sonography shows simple and mildly complicated cysts in the medial right breast accounting for mammographic findings.  Accordingly on 08/24/2019 she proceeded to biopsy of the left breast area in question. The pathology from this procedure showed ([id number]): invasive mammary carcinoma 2 o'clock, e-cadherin positive, grade 2. Prognostic indicators significant for: estrogen receptor, 95% positive and progesterone receptor, 95% positive, both with strong staining intensity. Proliferation marker Ki67 at 20%. HER2 equivocal (2+) by immunohistochemistry but negative by fluorescent in situ hybridization with a signals ratio 1.21 and number per cell 2.05.   An additional biopsy was taken of the left axilla area in question. The pathology from this procedure showed ([id number]): Invasive mammary carcinoma, pathologically similar to the biopsy of the left breast.   The patient's subsequent history is as detailed below.   PAST MEDICAL HISTORY: Past Medical History:  Diagnosis Date  . Arthritis of left knee 03/04/2019  . Breast cancer ([place]) 2021   [facility] left breast  . Breast cancer, left ([place])   . Cancer [facility]) 2005   breast  . Closed fracture of left distal femur ([place]) 03/04/2019  from [place]  . Complication of  anesthesia   . Hypertension   . Migraine   . Morbid obesity ([place])   . Obesity 03/04/2019  . Personal history of radiation therapy   . PONV (postoperative nausea and vomiting)     PAST SURGICAL HISTORY: Past Surgical History:  Procedure Laterality Date  . BREAST LUMPECTOMY    . BREAST RECONSTRUCTION WITH PLACEMENT OF TISSUE EXPANDER AND FLEX HD (ACELLULAR HYDRATED DERMIS) Left 10/19/2019   Procedure: BREAST RECONSTRUCTION WITH PLACEMENT OF TISSUE EXPANDER AND FLEX HD (ACELLULAR HYDRATED DERMIS);  Surgeon: Wallace Going, DO;  Location: [facility];  Service: Plastics;  Laterality: Left;  . BREAST SURGERY    . COLONOSCOPY    . MASTECTOMY MODIFIED RADICAL Left 10/19/2019   Procedure: LEFT MODIFIED RADICAL MASTECTOMY;  Surgeon: Jovita Kussmaul, MD;  Location: [facility];  Service: General;  Laterality: Left;  . ORIF FEMUR FRACTURE Left 03/03/2019   Procedure: OPEN REDUCTION INTERNAL FIXATION (ORIF) DISTAL FEMUR FRACTURE;  Surgeon: Altamese Wauregan, MD;  Location: [facility];  Service: Orthopedics;  Laterality: Left;  . TISSUE EXPANDER PLACEMENT Left 12/21/2019   Procedure: TISSUE EXPANDER REMOVAL;  Surgeon: Wallace Going, DO;  Location: [facility];  Service: Plastics;  Laterality: Left;  . TUBAL LIGATION      FAMILY HISTORY Family History  Problem Relation Age of Onset  . Diabetes Mother   . CAD Mother   . Hypertension Mother   . CVA Mother   . Diabetes Father   . CAD Father   . Brain cancer Paternal Grandfather        dx after 40yo  . Colon cancer Neg Hx   . Esophageal cancer Neg Hx   . Rectal cancer Neg Hx   . Stomach cancer Neg Hx   Anisa's father died at the age of 85 from CHF. Patients' mother died at the age of 60 from CVA. The patient has 2 brothers and 6 sisters, of which 5 sisters are living. Patient denies anyone in her family having breast, ovarian, prostate, or pancreatic cancer. Ahlaya notes that her paternal grandfather  was diagnosed with brain cancer at an unknown age.    GYNECOLOGIC HISTORY:  No LMP recorded. Patient is postmenopausal. 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  Currently however she is unemployed.  She is divorced and has four children, Arnetia Toland, Ulysees Barns, Leslie Dales, and [employer]. Quincy Carnes is 78, lives in [place], [place], and is a Programmer, multimedia. Ulysees Barns is 54, lives in [place], [place], and is a Patent examiner. Leslie Dales is 54, lives in [place], [place] and is an Human resources officer. Kathryne Sharper is 71, lives in [place], [place], and is a Biomedical scientist. Anela has 3 grandchildren and no great grandchildren. She does not have a church that she attends, but she was a Company secretary at one time.               ADVANCED DIRECTIVES:  Ulysees Barns. [phone]   HEALTH MAINTENANCE: Social History   Tobacco Use  . Smoking status: Never Smoker  . Smokeless tobacco: Never Used  Vaping Use  . Vaping Use: Never used  Substance Use  Topics  . Alcohol use: Yes    Comment: Rare  . Drug use: No     Colonoscopy:09/01/2019 (Dr. Havery Moros), repeat due 2028  PAP:  Bone density:   Allergies  Allergen Reactions  . Lovenox [Enoxaparin Sodium] Shortness Of Breath  . Vancomycin Itching    Itching developed at very end of vancomycin infusion, relieved with IV benadryl  . Sulfa Antibiotics Hives    Current Outpatient Medications  Medication Sig Dispense Refill  . acetaminophen (TYLENOL) 500 MG tablet Take 1 tablet (500 mg total) by mouth every 6 (six) hours as needed. For use AFTER surgery 30 tablet 0  . amLODipine (NORVASC) 10 MG tablet Take 1 tablet (10 mg total) by mouth daily. 90 tablet 1  . fluconazole  (DIFLUCAN) 100 MG tablet Take 1 tablet (100 mg total) by mouth daily. X 10 days 10 tablet 0  . HYDROcodone-acetaminophen (NORCO) 5-325 MG tablet Take 1 tablet by mouth every 8 (eight) hours as needed for up to 7 days for severe pain. For use AFTER Surgery 21 tablet 0  . ibuprofen (ADVIL) 100 MG/5ML suspension Take 30 mLs (600 mg total) by mouth every 6 (six) hours as needed for mild pain or moderate pain. 473 mL 6  . ondansetron (ZOFRAN) 4 MG tablet Take 1 tablet (4 mg total) by mouth every 8 (eight) hours as needed for nausea or vomiting. 20 tablet 0  . prochlorperazine (COMPAZINE) 10 MG tablet Take 1 tablet (10 mg total) by mouth every 6 (six) hours as needed (Nausea or vomiting). 30 tablet 1  . Turmeric 500 MG CAPS Take 1,000 mg by mouth daily.    [name] venlafaxine XR (EFFEXOR-XR) 37.5 MG 24 hr capsule Take 1 capsule (37.5 mg total) by mouth daily with breakfast. 90 capsule 4   No current facility-administered medications for this visit.    OBJECTIVE: African-American woman who appears stated age 65:   01/31/20 1340  BP: (!) 151/64  Pulse: (!) 104  Resp: 18  Temp: 97.6 F (36.4 C)  SpO2: 98%     Body mass index is 42.43 kg/m.   Wt Readings from Last 3 Encounters:  01/31/20 247 lb 3.2 oz (112.1 kg)  01/04/20 237 lb 11.2 oz (107.8 kg)  12/28/19 237 lb (107.5 kg)      ECOG FS:1 - Symptomatic but completely ambulatory  Sclerae unicteric, EOMs intact Wearing a mask No cervical or supraclavicular adenopathy Lungs no rales or rhonchi Heart regular rate and rhythm Abd soft, nontender, positive bowel sounds MSK no focal spinal tenderness, no upper extremity lymphedema Neuro: nonfocal, well oriented, appropriate affect Breasts: The left breast is status post mastectomy with expander removed.  The scar is very irregular.  There is no evidence of local residual or recurrent disease.   LAB RESULTS:  CMP     Component Value Date/Time   NA 140 01/31/2020 1311   K 3.8 01/31/2020  1311   CL 105 01/31/2020 1311   CO2 24 01/31/2020 1311   GLUCOSE 141 (H) 01/31/2020 1311   BUN 10 01/31/2020 1311   CREATININE 0.90 01/31/2020 1311   CALCIUM 9.3 01/31/2020 1311   PROT 7.7 01/31/2020 1311   ALBUMIN 3.5 01/31/2020 1311   AST 18 01/31/2020 1311   ALT 26 01/31/2020 1311   ALKPHOS 125 01/31/2020 1311   BILITOT 0.6 01/31/2020 1311   GFRNONAA >60 01/31/2020 1311   GFRAA >60 08/31/2019 1212    No results found for: TOTALPROTELP, ALBUMINELP, A1GS, A2GS, BETS, BETA2SER, [place], MSPIKE,  SPEI  No results found for: Nils Pyle, [facility]  Lab Results  Component Value Date   WBC 6.7 01/31/2020   NEUTROABS 3.8 01/31/2020   HGB 12.5 01/31/2020   HCT 39.6 01/31/2020   MCV 86.5 01/31/2020   PLT 196 01/31/2020      Chemistry      Component Value Date/Time   NA 140 01/31/2020 1311   K 3.8 01/31/2020 1311   CL 105 01/31/2020 1311   CO2 24 01/31/2020 1311   BUN 10 01/31/2020 1311   CREATININE 0.90 01/31/2020 1311      Component Value Date/Time   CALCIUM 9.3 01/31/2020 1311   ALKPHOS 125 01/31/2020 1311   AST 18 01/31/2020 1311   ALT 26 01/31/2020 1311   BILITOT 0.6 01/31/2020 1311       No results found for: LABCA2  No components found for: VHQION629  No results for input(s): INR in the last 168 hours.  No results found for: LABCA2  No results found for: BMW413  No results found for: KGM010  No results found for: UVO536  No results found for: CA2729  No components found for: HGQUANT  No results found for: CEA1 / No results found for: CEA1   No results found for: AFPTUMOR  No results found for: CHROMOGRNA  No results found for: PSA1  (this displays the last labs from the last 3 days)  No results found for: TOTALPROTELP, ALBUMINELP, A1GS, A2GS, BETS, BETA2SER, GAMS, MSPIKE, SPEI (this displays SPEP labs)  No results found for: KPAFRELGTCHN, LAMBDASER, KAPLAMBRATIO (kappa/lambda light chains)  No results found for: HGBA,  HGBA2QUANT, HGBFQUANT, HGBSQUAN (Hemoglobinopathy evaluation)   No results found for: LDH  No results found for: IRON, TIBC, IRONPCTSAT (Iron and TIBC)  No results found for: FERRITIN  Urinalysis    Component Value Date/Time   COLORURINE YELLOW 12/26/2016 0840   APPEARANCEUR CLEAR 12/26/2016 0840   LABSPEC 1.025 12/26/2016 0840   PHURINE 5.0 12/26/2016 0840   GLUCOSEU NEGATIVE 12/26/2016 0840   HGBUR SMALL (A) 12/26/2016 0840   BILIRUBINUR NEGATIVE 12/26/2016 0840   KETONESUR 5 (A) 12/26/2016 0840   PROTEINUR 30 (A) 12/26/2016 0840   NITRITE NEGATIVE 12/26/2016 0840   LEUKOCYTESUR NEGATIVE 12/26/2016 0840    STUDIES: No results found.   ELIGIBLE FOR AVAILABLE RESEARCH PROTOCOL: AET  ASSESSMENT: 59 y.o.  [place], [place] woman   (1) status post left lumpectomy September 2005 at [facility] in [place]             (a) status post adjuvant radiation  (2) status post left breast upper outer quadrant biopsy 08/24/2019 for a clinical T2 N1, stage IIA invasive ductal carcinoma, grade 2, E-cadherin positive, estrogen and progesterone receptor strongly positive, HER-2 not amplified, with an MIB-1 of 20%  (3) status post left modified radical mastectomy 10/19/2019 for a pT2 pN1, stage IIA invasive ductal carcinoma, grade 2, with negative margins.  (a) a total of 9 lymph nodes were removed, one positive (with extracapsular extension)  (b) left expander removed secondary to infection October 2021  (4) genetics testing 09/21/2019 through the [facility] Breast Cancer STAT panel found no deleterious mutations in  ATM, BRCA1, BRCA2, CDH1, CHEK2, PALB2, PTEN, STK11 and TP53.  The report date is September 10, 2019.  Also no pathogenic variants were noted through the [facility] Common Hereditary Cancer Panel: APC, ATM, AXIN2, BARD1, BMPR1A, BRCA1, BRCA2, BRIP1, CDH1, CDK4, CDKN2A (p14ARF), CDKN2A (p16INK4a), CHEK2, CTNNA1, DICER1, EPCAM (Deletion/duplication testing only), GREM1  (promoter region deletion/duplication  testing only), KIT, MEN1, MLH1, MSH2, MSH3, MSH6, MUTYH, NBN, NF1, NHTL1, PALB2, PDGFRA, PMS2, POLD1, POLE, PTEN, RAD50, RAD51C, RAD51D, RNF43, SDHB, SDHC, SDHD, SMAD4, SMARCA4. STK11, TP53, TSC1, TSC2, and VHL.  The following genes were evaluated for sequence changes only: SDHA and HOXB13 c.251G>A variant only.   (5) consider adjuvant radiation depending on prior treatment records  (6) MammaPrint obtained from the initial biopsy read as High Risk, predicting a chemotherapy benefit >12% and a 5-year distant disease free survival of 93% with chemotherapy and hormone therapy  (7)  started cyclophosphamide, methotrexate, and fluorouracil (CMF) on 12/28/2019, to be repeated every 21 days x 8  (a) cycle 2 delayed until 01/31/2020 secondary to intercurrent breast implant infection  (8) antiestrogens to start at the completion of adjuvant treatment   PLAN: Ernst Bowler has recovered from her intervening surgeries and cellulitis.  She is very motivated to continue treatment and very much wished to receive treatment today.  This was done.  She tolerated the first cycle of CMF well.  She will call us if she has any concerns or issues following this cycle.  So far she has not required a port.  She is comfortable with peripheral dosing of the chemotherapy agents  She knows to call for any other issues that may develop before the next visit.  Total encounter time 25 minutes.Chauncey Cruel, MD   01/31/2020 5:31 PM Medical Oncology and Hematology [facility] [place], [place] [address] Tel. [phone]    Fax. [phone]   I, Wilburn Mylar, am acting as scribe for Dr. Virgie Dad. Nikelle Malatesta.  I, Lurline Del MD, have reviewed the above documentation for accuracy and completeness, and I agree with the above.   *Total Encounter Time as defined by the Centers for Medicare and Medicaid Services includes, in addition to the  face-to-face time of a patient visit (documented in the note above) non-face-to-face time: obtaining and reviewing outside history, ordering and reviewing medications, tests or procedures, care coordination (communications with other health care professionals or caregivers) and documentation in the medical record.

## 2020-01-31 ENCOUNTER — Inpatient Hospital Stay: Payer: 59

## 2020-01-31 ENCOUNTER — Other Ambulatory Visit: Payer: Self-pay

## 2020-01-31 ENCOUNTER — Inpatient Hospital Stay: Payer: 59 | Attending: Oncology | Admitting: Oncology

## 2020-01-31 VITALS — BP 151/64 | HR 104 | Temp 97.6°F | Resp 18 | Ht 64.0 in | Wt 247.2 lb

## 2020-01-31 VITALS — HR 96

## 2020-01-31 DIAGNOSIS — Z170 Estrogen receptor positive status [ER+]: Secondary | ICD-10-CM

## 2020-01-31 DIAGNOSIS — Z79899 Other long term (current) drug therapy: Secondary | ICD-10-CM | POA: Insufficient documentation

## 2020-01-31 DIAGNOSIS — Z5111 Encounter for antineoplastic chemotherapy: Secondary | ICD-10-CM | POA: Diagnosis not present

## 2020-01-31 DIAGNOSIS — C50412 Malignant neoplasm of upper-outer quadrant of left female breast: Secondary | ICD-10-CM

## 2020-01-31 LAB — CBC WITH DIFFERENTIAL (CANCER CENTER ONLY)
Abs Immature Granulocytes: 0.03 10*3/uL (ref 0.00–0.07)
Basophils Absolute: 0 10*3/uL (ref 0.0–0.1)
Basophils Relative: 1 %
Eosinophils Absolute: 0.1 10*3/uL (ref 0.0–0.5)
Eosinophils Relative: 1 %
HCT: 39.6 % (ref 36.0–46.0)
Hemoglobin: 12.5 g/dL (ref 12.0–15.0)
Immature Granulocytes: 0 %
Lymphocytes Relative: 33 %
Lymphs Abs: 2.2 10*3/uL (ref 0.7–4.0)
MCH: 27.3 pg (ref 26.0–34.0)
MCHC: 31.6 g/dL (ref 30.0–36.0)
MCV: 86.5 fL (ref 80.0–100.0)
Monocytes Absolute: 0.6 10*3/uL (ref 0.1–1.0)
Monocytes Relative: 8 %
Neutro Abs: 3.8 10*3/uL (ref 1.7–7.7)
Neutrophils Relative %: 57 %
Platelet Count: 196 10*3/uL (ref 150–400)
RBC: 4.58 MIL/uL (ref 3.87–5.11)
RDW: 18.2 % — ABNORMAL HIGH (ref 11.5–15.5)
WBC Count: 6.7 10*3/uL (ref 4.0–10.5)
nRBC: 0 % (ref 0.0–0.2)

## 2020-01-31 LAB — CMP (CANCER CENTER ONLY)
ALT: 26 U/L (ref 0–44)
AST: 18 U/L (ref 15–41)
Albumin: 3.5 g/dL (ref 3.5–5.0)
Alkaline Phosphatase: 125 U/L (ref 38–126)
Anion gap: 11 (ref 5–15)
BUN: 10 mg/dL (ref 6–20)
CO2: 24 mmol/L (ref 22–32)
Calcium: 9.3 mg/dL (ref 8.9–10.3)
Chloride: 105 mmol/L (ref 98–111)
Creatinine: 0.9 mg/dL (ref 0.44–1.00)
GFR, Estimated: >60 mL/min (ref >60)
Glucose, Bld: 141 mg/dL — ABNORMAL HIGH (ref 70–99)
Potassium: 3.8 mmol/L (ref 3.5–5.1)
Sodium: 140 mmol/L (ref 135–145)
Total Bilirubin: 0.6 mg/dL (ref 0.3–1.2)
Total Protein: 7.7 g/dL (ref 6.5–8.1)

## 2020-01-31 MED ORDER — SODIUM CHLORIDE 0.9 % IV SOLN
10.0000 mg | Freq: Once | INTRAVENOUS | Status: AC
  Administered 2020-01-31: 10 mg via INTRAVENOUS
  Filled 2020-01-31: qty 10

## 2020-01-31 MED ORDER — SODIUM CHLORIDE 0.9 % IV SOLN
600.0000 mg/m2 | Freq: Once | INTRAVENOUS | Status: AC
  Administered 2020-01-31: 1340 mg via INTRAVENOUS
  Filled 2020-01-31: qty 67

## 2020-01-31 MED ORDER — METHOTREXATE SODIUM (PF) CHEMO INJECTION 250 MG/10ML
40.4000 mg/m2 | Freq: Once | INTRAMUSCULAR | Status: AC
  Administered 2020-01-31: 90 mg via INTRAVENOUS
  Filled 2020-01-31: qty 3.6

## 2020-01-31 MED ORDER — PALONOSETRON HCL INJECTION 0.25 MG/5ML
INTRAVENOUS | Status: AC
  Filled 2020-01-31: qty 5

## 2020-01-31 MED ORDER — PALONOSETRON HCL INJECTION 0.25 MG/5ML
0.2500 mg | Freq: Once | INTRAVENOUS | Status: AC
Start: 2020-01-31 — End: 2020-01-31
  Administered 2020-01-31: 0.25 mg via INTRAVENOUS

## 2020-01-31 MED ORDER — SODIUM CHLORIDE 0.9 % IV SOLN
Freq: Once | INTRAVENOUS | Status: AC
  Filled 2020-01-31: qty 250

## 2020-01-31 MED ORDER — FLUOROURACIL CHEMO INJECTION 2.5 GM/50ML
600.0000 mg/m2 | Freq: Once | INTRAVENOUS | Status: AC
  Administered 2020-01-31: 1350 mg via INTRAVENOUS
  Filled 2020-01-31: qty 27

## 2020-01-31 NOTE — Patient Instructions (Signed)
[facility] Discharge Instructions for Patients Receiving Chemotherapy  Today you received the following chemotherapy agents: cyclophosphamide, methotrexate, fluorouracil  To help prevent nausea and vomiting after your treatment, we encourage you to take your nausea medication as needed.   If you develop nausea and vomiting that is not controlled by your nausea medication, call the clinic.   BELOW ARE SYMPTOMS THAT SHOULD BE REPORTED IMMEDIATELY:  *FEVER GREATER THAN 100.5 F  *CHILLS WITH OR WITHOUT FEVER  NAUSEA AND VOMITING THAT IS NOT CONTROLLED WITH YOUR NAUSEA MEDICATION  *UNUSUAL SHORTNESS OF BREATH  *UNUSUAL BRUISING OR BLEEDING  TENDERNESS IN MOUTH AND THROAT WITH OR WITHOUT PRESENCE OF ULCERS  *URINARY PROBLEMS  *BOWEL PROBLEMS  UNUSUAL RASH Items with * indicate a potential emergency and should be followed up as soon as possible.  Feel free to call the clinic should you have any questions or concerns. The clinic phone number is [phone].  Please show the [facility] at check-in to the Emergency Department and triage nurse.

## 2020-02-01 ENCOUNTER — Other Ambulatory Visit: Payer: Self-pay

## 2020-02-01 ENCOUNTER — Encounter (HOSPITAL_BASED_OUTPATIENT_CLINIC_OR_DEPARTMENT_OTHER): Payer: Self-pay | Admitting: Plastic Surgery

## 2020-02-02 ENCOUNTER — Encounter: Payer: Self-pay | Admitting: *Deleted

## 2020-02-02 ENCOUNTER — Telehealth: Payer: Self-pay | Admitting: Oncology

## 2020-02-02 NOTE — Telephone Encounter (Signed)
Scheduled appts per 12/14 los. Left voicemail with appt date and time.  

## 2020-02-06 ENCOUNTER — Other Ambulatory Visit (HOSPITAL_COMMUNITY)
Admission: RE | Admit: 2020-02-06 | Discharge: 2020-02-06 | Disposition: A | Discharge status: Home / Self Care | Payer: 59 | Source: Ambulatory Visit | Attending: Plastic Surgery | Admitting: Plastic Surgery

## 2020-02-06 DIAGNOSIS — Z01812 Encounter for preprocedural laboratory examination: Secondary | ICD-10-CM | POA: Diagnosis not present

## 2020-02-06 DIAGNOSIS — Z20822 Contact with and (suspected) exposure to covid-19: Secondary | ICD-10-CM | POA: Insufficient documentation

## 2020-02-06 LAB — SARS CORONAVIRUS 2 (TAT 6-24 HRS): SARS Coronavirus 2: NEGATIVE

## 2020-02-09 ENCOUNTER — Ambulatory Visit (HOSPITAL_BASED_OUTPATIENT_CLINIC_OR_DEPARTMENT_OTHER): Payer: 59 | Admitting: Anesthesiology

## 2020-02-09 ENCOUNTER — Other Ambulatory Visit: Payer: Self-pay

## 2020-02-09 ENCOUNTER — Encounter (HOSPITAL_BASED_OUTPATIENT_CLINIC_OR_DEPARTMENT_OTHER): Payer: Self-pay | Admitting: Plastic Surgery

## 2020-02-09 ENCOUNTER — Ambulatory Visit (HOSPITAL_BASED_OUTPATIENT_CLINIC_OR_DEPARTMENT_OTHER)
Admission: RE | Admit: 2020-02-09 | Discharge: 2020-02-09 | Disposition: A | Discharge status: Home / Self Care | Payer: 59 | Source: Ambulatory Visit | Attending: Plastic Surgery | Admitting: Plastic Surgery

## 2020-02-09 ENCOUNTER — Encounter (HOSPITAL_BASED_OUTPATIENT_CLINIC_OR_DEPARTMENT_OTHER)
Admission: RE | Disposition: A | Discharge status: Home / Self Care | Payer: Self-pay | Source: Ambulatory Visit | Attending: Plastic Surgery

## 2020-02-09 DIAGNOSIS — N62 Hypertrophy of breast: Secondary | ICD-10-CM | POA: Diagnosis not present

## 2020-02-09 DIAGNOSIS — N651 Disproportion of reconstructed breast: Secondary | ICD-10-CM | POA: Diagnosis not present

## 2020-02-09 DIAGNOSIS — Z853 Personal history of malignant neoplasm of breast: Secondary | ICD-10-CM | POA: Diagnosis not present

## 2020-02-09 DIAGNOSIS — Z9012 Acquired absence of left breast and nipple: Secondary | ICD-10-CM | POA: Diagnosis not present

## 2020-02-09 DIAGNOSIS — Z882 Allergy status to sulfonamides status: Secondary | ICD-10-CM | POA: Diagnosis not present

## 2020-02-09 DIAGNOSIS — M545 Low back pain, unspecified: Secondary | ICD-10-CM | POA: Diagnosis not present

## 2020-02-09 DIAGNOSIS — Z881 Allergy status to other antibiotic agents status: Secondary | ICD-10-CM | POA: Diagnosis not present

## 2020-02-09 DIAGNOSIS — Z888 Allergy status to other drugs, medicaments and biological substances status: Secondary | ICD-10-CM | POA: Insufficient documentation

## 2020-02-09 DIAGNOSIS — Z79899 Other long term (current) drug therapy: Secondary | ICD-10-CM | POA: Diagnosis not present

## 2020-02-09 HISTORY — PX: UNILATERAL BREAST REDUCTION: SHX6885

## 2020-02-09 SURGERY — MAMMOPLASTY, REDUCTION, UNILATERAL
Anesthesia: General | Site: Breast | Laterality: Right

## 2020-02-09 MED ORDER — CEFAZOLIN SODIUM-DEXTROSE 2-4 GM/100ML-% IV SOLN
INTRAVENOUS | Status: AC
  Filled 2020-02-09: qty 100

## 2020-02-09 MED ORDER — FENTANYL CITRATE (PF) 100 MCG/2ML IJ SOLN
INTRAMUSCULAR | Status: DC | PRN
  Administered 2020-02-09: 100 ug via INTRAVENOUS
  Administered 2020-02-09: 50 ug via INTRAVENOUS

## 2020-02-09 MED ORDER — DIPHENHYDRAMINE HCL 50 MG/ML IJ SOLN
INTRAMUSCULAR | Status: DC | PRN
  Administered 2020-02-09: 6.25 mg via INTRAVENOUS

## 2020-02-09 MED ORDER — ACETAMINOPHEN 500 MG PO TABS
1000.0000 mg | ORAL_TABLET | Freq: Once | ORAL | Status: AC
  Administered 2020-02-09: 1000 mg via ORAL

## 2020-02-09 MED ORDER — PROMETHAZINE HCL 25 MG/ML IJ SOLN: 6.2500 mg | INTRAMUSCULAR | Status: DC | PRN

## 2020-02-09 MED ORDER — ACETAMINOPHEN 325 MG PO TABS: 650.0000 mg | ORAL_TABLET | ORAL | Status: DC | PRN

## 2020-02-09 MED ORDER — ONDANSETRON HCL 4 MG/2ML IJ SOLN
INTRAMUSCULAR | Status: AC
  Filled 2020-02-09: qty 2

## 2020-02-09 MED ORDER — OXYCODONE HCL 5 MG PO TABS: 5.0000 mg | ORAL_TABLET | ORAL | Status: DC | PRN

## 2020-02-09 MED ORDER — SODIUM CHLORIDE 0.9% FLUSH: 3.0000 mL | Freq: Two times a day (BID) | INTRAVENOUS | Status: DC

## 2020-02-09 MED ORDER — LIDOCAINE HCL (CARDIAC) PF 100 MG/5ML IV SOSY
PREFILLED_SYRINGE | INTRAVENOUS | Status: DC | PRN
  Administered 2020-02-09: 100 mg via INTRAVENOUS

## 2020-02-09 MED ORDER — ROCURONIUM BROMIDE 10 MG/ML (PF) SYRINGE
PREFILLED_SYRINGE | INTRAVENOUS | Status: AC
  Filled 2020-02-09: qty 10

## 2020-02-09 MED ORDER — OXYCODONE HCL 5 MG/5ML PO SOLN: 5.0000 mg | Freq: Once | ORAL | Status: AC | PRN

## 2020-02-09 MED ORDER — PROPOFOL 500 MG/50ML IV EMUL
INTRAVENOUS | Status: DC | PRN
  Administered 2020-02-09: 250 ug/kg/min via INTRAVENOUS

## 2020-02-09 MED ORDER — DIPHENHYDRAMINE HCL 50 MG/ML IJ SOLN
INTRAMUSCULAR | Status: AC
  Filled 2020-02-09: qty 1

## 2020-02-09 MED ORDER — DEXAMETHASONE SODIUM PHOSPHATE 10 MG/ML IJ SOLN
INTRAMUSCULAR | Status: AC
  Filled 2020-02-09: qty 1

## 2020-02-09 MED ORDER — ACETAMINOPHEN 500 MG PO TABS
ORAL_TABLET | ORAL | Status: AC
  Filled 2020-02-09: qty 2

## 2020-02-09 MED ORDER — BUPIVACAINE HCL (PF) 0.25 % IJ SOLN
INTRAMUSCULAR | Status: AC
  Filled 2020-02-09: qty 30

## 2020-02-09 MED ORDER — ONDANSETRON HCL 4 MG/2ML IJ SOLN
INTRAMUSCULAR | Status: DC | PRN
  Administered 2020-02-09 (×2): 4 mg via INTRAVENOUS

## 2020-02-09 MED ORDER — FENTANYL CITRATE (PF) 100 MCG/2ML IJ SOLN: 25.0000 ug | INTRAMUSCULAR | Status: DC | PRN

## 2020-02-09 MED ORDER — DEXAMETHASONE SODIUM PHOSPHATE 4 MG/ML IJ SOLN
INTRAMUSCULAR | Status: DC | PRN
  Administered 2020-02-09: 10 mg via INTRAVENOUS

## 2020-02-09 MED ORDER — SCOPOLAMINE 1 MG/3DAYS TD PT72
1.0000 | MEDICATED_PATCH | Freq: Once | TRANSDERMAL | Status: DC
  Administered 2020-02-09: 1.5 mg via TRANSDERMAL

## 2020-02-09 MED ORDER — CEFAZOLIN SODIUM-DEXTROSE 2-4 GM/100ML-% IV SOLN
2.0000 g | INTRAVENOUS | Status: AC
  Administered 2020-02-09: 2 g via INTRAVENOUS

## 2020-02-09 MED ORDER — SODIUM CHLORIDE 0.9 % IV SOLN: 250.0000 mL | INTRAVENOUS | Status: DC | PRN

## 2020-02-09 MED ORDER — PHENYLEPHRINE HCL (PRESSORS) 10 MG/ML IV SOLN
INTRAVENOUS | Status: DC | PRN
  Administered 2020-02-09: 80 ug via INTRAVENOUS
  Administered 2020-02-09: 120 ug via INTRAVENOUS
  Administered 2020-02-09 (×2): 80 ug via INTRAVENOUS

## 2020-02-09 MED ORDER — SUGAMMADEX SODIUM 500 MG/5ML IV SOLN: INTRAVENOUS | Status: DC | PRN

## 2020-02-09 MED ORDER — SUCCINYLCHOLINE CHLORIDE 200 MG/10ML IV SOSY
PREFILLED_SYRINGE | INTRAVENOUS | Status: AC
  Filled 2020-02-09: qty 10

## 2020-02-09 MED ORDER — SUGAMMADEX SODIUM 500 MG/5ML IV SOLN
INTRAVENOUS | Status: DC | PRN
  Administered 2020-02-09: 300 mg via INTRAVENOUS

## 2020-02-09 MED ORDER — OXYCODONE HCL 5 MG PO TABS
5.0000 mg | ORAL_TABLET | Freq: Once | ORAL | Status: AC | PRN
  Administered 2020-02-09: 5 mg via ORAL

## 2020-02-09 MED ORDER — ACETAMINOPHEN 325 MG RE SUPP: 650.0000 mg | RECTAL | Status: DC | PRN

## 2020-02-09 MED ORDER — OXYCODONE HCL 5 MG PO TABS
ORAL_TABLET | ORAL | Status: AC
  Filled 2020-02-09: qty 1

## 2020-02-09 MED ORDER — FENTANYL CITRATE (PF) 100 MCG/2ML IJ SOLN
25.0000 ug | INTRAMUSCULAR | Status: DC | PRN
  Administered 2020-02-09 (×2): 50 ug via INTRAVENOUS

## 2020-02-09 MED ORDER — PROPOFOL 10 MG/ML IV BOLUS
INTRAVENOUS | Status: DC | PRN
  Administered 2020-02-09: 200 mg via INTRAVENOUS

## 2020-02-09 MED ORDER — PHENYLEPHRINE 40 MCG/ML (10ML) SYRINGE FOR IV PUSH (FOR BLOOD PRESSURE SUPPORT)
PREFILLED_SYRINGE | INTRAVENOUS | Status: AC
  Filled 2020-02-09: qty 10

## 2020-02-09 MED ORDER — FENTANYL CITRATE (PF) 100 MCG/2ML IJ SOLN
INTRAMUSCULAR | Status: AC
  Filled 2020-02-09: qty 2

## 2020-02-09 MED ORDER — EPHEDRINE 5 MG/ML INJ
INTRAVENOUS | Status: AC
  Filled 2020-02-09: qty 10

## 2020-02-09 MED ORDER — ROCURONIUM BROMIDE 100 MG/10ML IV SOLN
INTRAVENOUS | Status: DC | PRN
  Administered 2020-02-09: 60 mg via INTRAVENOUS

## 2020-02-09 MED ORDER — LACTATED RINGERS IV SOLN: INTRAVENOUS | Status: DC

## 2020-02-09 MED ORDER — MIDAZOLAM HCL 2 MG/2ML IJ SOLN
INTRAMUSCULAR | Status: AC
  Filled 2020-02-09: qty 2

## 2020-02-09 MED ORDER — LIDOCAINE-EPINEPHRINE 1 %-1:100000 IJ SOLN
INTRAMUSCULAR | Status: DC | PRN
  Administered 2020-02-09: 20 mL

## 2020-02-09 MED ORDER — CHLORHEXIDINE GLUCONATE CLOTH 2 % EX PADS: 6.0000 | MEDICATED_PAD | Freq: Once | CUTANEOUS | Status: DC

## 2020-02-09 MED ORDER — SODIUM CHLORIDE 0.9% FLUSH: 3.0000 mL | INTRAVENOUS | Status: DC | PRN

## 2020-02-09 MED ORDER — LIDOCAINE-EPINEPHRINE 1 %-1:100000 IJ SOLN
INTRAMUSCULAR | Status: AC
  Filled 2020-02-09: qty 1

## 2020-02-09 MED ORDER — SCOPOLAMINE 1 MG/3DAYS TD PT72
MEDICATED_PATCH | TRANSDERMAL | Status: AC
  Filled 2020-02-09: qty 1

## 2020-02-09 MED ORDER — 0.9 % SODIUM CHLORIDE (POUR BTL) OPTIME
TOPICAL | Status: DC | PRN
  Administered 2020-02-09: 1000 mL

## 2020-02-09 MED ORDER — MIDAZOLAM HCL 5 MG/5ML IJ SOLN
INTRAMUSCULAR | Status: DC | PRN
  Administered 2020-02-09: 2 mg via INTRAVENOUS

## 2020-02-09 SURGICAL SUPPLY — 66 items
BAG DECANTER FOR FLEXI CONT (MISCELLANEOUS) ×2 IMPLANT
BINDER BREAST LRG (GAUZE/BANDAGES/DRESSINGS) IMPLANT
BINDER BREAST MEDIUM (GAUZE/BANDAGES/DRESSINGS) IMPLANT
BINDER BREAST XLRG (GAUZE/BANDAGES/DRESSINGS) IMPLANT
BINDER BREAST XXLRG (GAUZE/BANDAGES/DRESSINGS) ×2 IMPLANT
BIOPATCH RED 1 DISK 7.0 (GAUZE/BANDAGES/DRESSINGS) IMPLANT
BLADE HEX COATED 2.75 (ELECTRODE) ×2 IMPLANT
BLADE KNIFE PERSONA 10 (BLADE) ×4 IMPLANT
BLADE SURG 15 STRL LF DISP TIS (BLADE) IMPLANT
BLADE SURG 15 STRL SS (BLADE)
BNDG GAUZE ELAST 4 BULKY (GAUZE/BANDAGES/DRESSINGS) IMPLANT
CANISTER SUCT 1200ML W/VALVE (MISCELLANEOUS) ×2 IMPLANT
COVER BACK TABLE 60X90IN (DRAPES) ×2 IMPLANT
COVER MAYO STAND STRL (DRAPES) ×2 IMPLANT
COVER WAND RF STERILE (DRAPES) IMPLANT
DECANTER SPIKE VIAL GLASS SM (MISCELLANEOUS) IMPLANT
DERMABOND ADVANCED (GAUZE/BANDAGES/DRESSINGS) ×2
DERMABOND ADVANCED .7 DNX12 (GAUZE/BANDAGES/DRESSINGS) ×2 IMPLANT
DRAIN CHANNEL 19F RND (DRAIN) IMPLANT
DRAPE LAPAROSCOPIC ABDOMINAL (DRAPES) ×2 IMPLANT
DRSG OPSITE POSTOP 4X6 (GAUZE/BANDAGES/DRESSINGS) ×4 IMPLANT
DRSG PAD ABDOMINAL 8X10 ST (GAUZE/BANDAGES/DRESSINGS) ×4 IMPLANT
ELECT BLADE 4.0 EZ CLEAN MEGAD (MISCELLANEOUS)
ELECT REM PT RETURN 9FT ADLT (ELECTROSURGICAL) ×2
ELECTRODE BLDE 4.0 EZ CLN MEGD (MISCELLANEOUS) IMPLANT
ELECTRODE REM PT RTRN 9FT ADLT (ELECTROSURGICAL) ×1 IMPLANT
EVACUATOR SILICONE 100CC (DRAIN) IMPLANT
GLOVE BIO SURGEON STRL SZ 6.5 (GLOVE) ×8 IMPLANT
GOWN STRL REUS W/ TWL LRG LVL3 (GOWN DISPOSABLE) ×2 IMPLANT
GOWN STRL REUS W/TWL LRG LVL3 (GOWN DISPOSABLE) ×2
NDL SAFETY ECLIPSE 18X1.5 (NEEDLE) IMPLANT
NEEDLE FILTER BLUNT 18X 1/2SAF (NEEDLE)
NEEDLE FILTER BLUNT 18X1 1/2 (NEEDLE) IMPLANT
NEEDLE HYPO 18GX1.5 SHARP (NEEDLE)
NEEDLE HYPO 25X1 1.5 SAFETY (NEEDLE) ×2 IMPLANT
NS IRRIG 1000ML POUR BTL (IV SOLUTION) ×2 IMPLANT
PACK BASIN DAY SURGERY FS (CUSTOM PROCEDURE TRAY) ×2 IMPLANT
PAD ALCOHOL SWAB (MISCELLANEOUS) IMPLANT
PAD FOAM SILICONE BACKED (GAUZE/BANDAGES/DRESSINGS) IMPLANT
PENCIL SMOKE EVACUATOR (MISCELLANEOUS) ×2 IMPLANT
PIN SAFETY STERILE (MISCELLANEOUS) IMPLANT
SLEEVE SCD COMPRESS KNEE MED (MISCELLANEOUS) ×2 IMPLANT
SPONGE LAP 18X18 RF (DISPOSABLE) ×4 IMPLANT
STRIP SUTURE WOUND CLOSURE 1/2 (MISCELLANEOUS) ×4 IMPLANT
SUT MNCRL AB 4-0 PS2 18 (SUTURE) ×8 IMPLANT
SUT MON AB 3-0 SH 27 (SUTURE) ×4
SUT MON AB 3-0 SH27 (SUTURE) ×4 IMPLANT
SUT MON AB 5-0 PS2 18 (SUTURE) ×2 IMPLANT
SUT PDS 3-0 CT2 (SUTURE) ×2
SUT PDS AB 2-0 CT2 27 (SUTURE) IMPLANT
SUT PDS II 3-0 CT2 27 ABS (SUTURE) ×1 IMPLANT
SUT SILK 3 0 PS 1 (SUTURE) IMPLANT
SUT VIC AB 3-0 SH 27 (SUTURE)
SUT VIC AB 3-0 SH 27X BRD (SUTURE) IMPLANT
SUT VICRYL 4-0 PS2 18IN ABS (SUTURE) IMPLANT
SYR 50ML LL SCALE MARK (SYRINGE) IMPLANT
SYR BULB IRRIG 60ML STRL (SYRINGE) ×2 IMPLANT
SYR CONTROL 10ML LL (SYRINGE) ×2 IMPLANT
TAPE MEASURE VINYL STERILE (MISCELLANEOUS) IMPLANT
TOWEL GREEN STERILE FF (TOWEL DISPOSABLE) ×4 IMPLANT
TRAY DSU PREP LF (CUSTOM PROCEDURE TRAY) ×2 IMPLANT
TUBE CONNECTING 20X1/4 (TUBING) ×2 IMPLANT
TUBING INFILTRATION IT-10001 (TUBING) IMPLANT
TUBING SET GRADUATE ASPIR 12FT (MISCELLANEOUS) IMPLANT
UNDERPAD 30X36 HEAVY ABSORB (UNDERPADS AND DIAPERS) ×4 IMPLANT
YANKAUER SUCT BULB TIP NO VENT (SUCTIONS) ×2 IMPLANT

## 2020-02-09 NOTE — Discharge Instructions (Signed)
Next dose of Tylenol can be given after 5:38 PM.    Post Anesthesia Home Care Instructions  Activity: Get plenty of rest for the remainder of the day. A responsible individual must stay with you for 24 hours following the procedure.  For the next 24 hours, DO NOT: -Drive a car -Paediatric nurse -Drink alcoholic beverages -Take any medication unless instructed by your physician -Make any legal decisions or sign important papers.  Meals: Start with liquid foods such as gelatin or soup. Progress to regular foods as tolerated. Avoid greasy, spicy, heavy foods. If nausea and/or vomiting occur, drink only clear liquids until the nausea and/or vomiting subsides. Call your physician if vomiting continues.  Special Instructions/Symptoms: Your throat may feel dry or sore from the anesthesia or the breathing tube placed in your throat during surgery. If this causes discomfort, gargle with warm salt water. The discomfort should disappear within 24 hours.  If you had a scopolamine patch placed behind your ear for the management of post- operative nausea and/or vomiting:  1. The medication in the patch is effective for 72 hours, after which it should be removed.  Wrap patch in a tissue and discard in the trash. Wash hands thoroughly with soap and water. 2. You may remove the patch earlier than 72 hours if you experience unpleasant side effects which may include dry mouth, dizziness or visual disturbances. 3. Avoid touching the patch. Wash your hands with soap and water after contact with the patch.        INSTRUCTIONS FOR AFTER SURGERY   You will likely have some questions about what to expect following your operation.  The following information will help you and your family understand what to expect when you are discharged from the hospital.  Following these guidelines will help ensure a smooth recovery and reduce risks of complications.  Postoperative instructions include information on:  diet, wound care, medications and physical activity.  AFTER SURGERY Expect to go home after the procedure.  In some cases, you may need to spend one night in the hospital for observation.  DIET This surgery does not require a specific diet.  However, I have to mention that the healthier you eat the better your body can start healing. It is important to increasing your protein intake.  This means limiting the foods with added sugar.  Focus on fruits and vegetables and some meat. It is very important to drink water after your surgery.  If your urine is bright yellow, then it is concentrated, and you need to drink more water.  As a general rule after surgery, you should have 8 ounces of water every hour while awake.  If you find you are persistently nauseated or unable to take in liquids let us know.  NO TOBACCO USE or EXPOSURE.  This will slow your healing process and increase the risk of a wound.  WOUND CARE If you don't have a drain: You can shower the day after surgery.  Use fragrance free soap.  Dial, [place], [place] and Cetaphil are usually mild on the skin.  If you have steri-strips / tape directly attached to your skin leave them in place. It is OK to get these wet.  No baths, pools or hot tubs for two weeks. We close your incision to leave the smallest and best-looking scar. No ointment or creams on your incisions until given the go ahead.  Especially not Neosporin (Too many skin reactions with this one).  A few weeks after surgery  you can use Mederma and start massaging the scar. We ask you to wear your binder or sports bra for the first 6 weeks around the clock, including while sleeping. This provides added comfort and helps reduce the fluid accumulation at the surgery site.  ACTIVITY No heavy lifting until cleared by the doctor.  It is OK to walk and climb stairs. In fact, moving your legs is very important to decrease your risk of a blood clot.  It will also help keep you from getting  deconditioned.  Every 1 to 2 hours get up and walk for 5 minutes. This will help with a quicker recovery back to normal.  Let pain be your guide so you don't do too much.  NO, you cannot do the spring cleaning and don't plan on taking care of anyone else.  This is your time for TLC.   WORK Everyone returns to work at different times. As a rough guide, most people take at least 1 - 2 weeks off prior to returning to work. If you need documentation for your job, bring the forms to your postoperative follow up visit.  DRIVING Arrange for someone to bring you home from the hospital.  You may be able to drive a few days after surgery but not while taking any narcotics or valium.  BOWEL MOVEMENTS Constipation can occur after anesthesia and while taking pain medication.  It is important to stay ahead for your comfort.  We recommend taking Milk of Magnesia (2 tablespoons; twice a day) while taking the pain pills.  SEROMA This is fluid your body tried to put in the surgical site.  This is normal but if it creates excessive pain and swelling let us know.  It usually decreases in a few weeks.  MEDICATIONS and PAIN CONTROL At your preoperative visit for you history and physical you were given the following medications: 1. An antibiotic: Start this medication when you get home and take according to the instructions on the bottle. 2. Zofran 4 mg:  This is to treat nausea and vomiting.  You can take this every 6 hours as needed and only if needed. 3. Norco (hydrocodone/acetaminophen) 5/325 mg:  This is only to be used after you have taken the motrin or the tylenol. Every 8 hours as needed. Over the counter Medication to take: 4. Ibuprofen (Motrin) 600 mg:  Take this every 6 hours.  If you have additional pain then take 500 mg of the tylenol.  Only take the Norco after you have tried these two. 5. Miralax or stool softener of choice: Take this according to the bottle if you take the [place] Call  your surgeon's office if any of the following occur: . Fever 101 degrees F or greater . Excessive bleeding or fluid from the incision site. . Pain that increases over time without aid from the medications . Redness, warmth, or pus draining from incision sites . Persistent nausea or inability to take in liquids . Severe misshapen area that underwent the operation.

## 2020-02-09 NOTE — Interval H&P Note (Signed)
History and Physical Interval Note:  02/09/2020 11:16 AM  Lauren Garrison  has presented today for surgery, with the diagnosis of history of breast cancer, breast asymmetry.  The various methods of treatment have been discussed with the patient and family. After consideration of risks, benefits and other options for treatment, the patient has consented to  Procedure(s) with comments: UNILATERAL BREAST REDUCTION WITH LIPOSUCTION (Right) - 2.5 hours, please as a surgical intervention.  The patient's history has been reviewed, patient examined, no change in status, stable for surgery.  I have reviewed the patient's chart and labs.  Questions were answered to the patient's satisfaction.     [name] Lauren Garrison

## 2020-02-09 NOTE — Anesthesia Preprocedure Evaluation (Addendum)
Anesthesia Evaluation  Patient identified by MRN, date of birth, ID band Patient awake    Reviewed: Allergy & Precautions, NPO status , Patient's Chart, lab work & pertinent test results  History of Anesthesia Complications (+) PONV and history of anesthetic complications  Airway Mallampati: II  TM Distance: >3 FB Neck ROM: Full    Dental no notable dental hx.    Pulmonary neg pulmonary ROS,    Pulmonary exam normal        Cardiovascular hypertension, Pt. on medications Normal cardiovascular exam     Neuro/Psych  Headaches, negative psych ROS   GI/Hepatic negative GI ROS, Neg liver ROS,   Endo/Other  Morbid obesity  Renal/GU negative Renal ROS  negative genitourinary   Musculoskeletal  (+) Arthritis ,   Abdominal   Peds  Hematology negative hematology ROS (+)   Anesthesia Other Findings Breast ca  Reproductive/Obstetrics negative OB ROS                            Anesthesia Physical Anesthesia Plan  ASA: III  Anesthesia Plan: General   Post-op Pain Management:    Induction: Intravenous  PONV Risk Score and Plan: 4 or greater and Propofol infusion, Ondansetron, Dexamethasone, TIVA, Midazolam, Scopolamine patch - Pre-op and Treatment may vary due to age or medical condition  Airway Management Planned: Oral ETT  Additional Equipment: None  Intra-op Plan:   Post-operative Plan: Extubation in OR  Informed Consent: I have reviewed the patients History and Physical, chart, labs and discussed the procedure including the risks, benefits and alternatives for the proposed anesthesia with the patient or authorized representative who has indicated his/her understanding and acceptance.     Dental advisory given  Plan Discussed with: CRNA  Anesthesia Plan Comments:        Anesthesia Quick Evaluation

## 2020-02-09 NOTE — Anesthesia Procedure Notes (Signed)
Procedure Name: Intubation Date/Time: 02/09/2020 12:13 PM Performed by: Willa Frater, CRNA Pre-anesthesia Checklist: Patient identified, Emergency Drugs available, Suction available and Patient being monitored Patient Re-evaluated:Patient Re-evaluated prior to induction Oxygen Delivery Method: Circle system utilized Preoxygenation: Pre-oxygenation with 100% oxygen Induction Type: IV induction Ventilation: Mask ventilation without difficulty Laryngoscope Size: Mac and 3 Grade View: Grade II Tube type: Oral Tube size: 7.5 mm Number of attempts: 1 Airway Equipment and Method: Stylet and Oral airway Placement Confirmation: ETT inserted through vocal cords under direct vision,  positive ETCO2 and breath sounds checked- equal and bilateral Secured at: 21 cm Tube secured with: Tape Dental Injury: Teeth and Oropharynx as per pre-operative assessment

## 2020-02-09 NOTE — Anesthesia Postprocedure Evaluation (Signed)
Anesthesia Post Note  Patient: Lauren Garrison  Procedure(s) Performed: RIGHT BREAST REDUCTION (Right Breast)     Patient location during evaluation: PACU Anesthesia Type: General Level of consciousness: awake and alert and oriented Pain management: pain level controlled Vital Signs Assessment: post-procedure vital signs reviewed and stable Respiratory status: spontaneous breathing, nonlabored ventilation and respiratory function stable Cardiovascular status: blood pressure returned to baseline Postop Assessment: no apparent nausea or vomiting Anesthetic complications: no   No complications documented.  Last Vitals:  Vitals:   02/09/20 1415 02/09/20 1420  BP: (!) 145/69   Pulse: 97   Resp: 15   Temp:    SpO2: 92% 95%    Last Pain:  Vitals:   02/09/20 1406  TempSrc:   PainSc: [place]

## 2020-02-09 NOTE — Transfer of Care (Signed)
Immediate Anesthesia Transfer of Care Note  Patient: Lauren Garrison  Procedure(s) Performed: RIGHT BREAST REDUCTION (Right Breast)  Patient Location: PACU  Anesthesia Type:General  Level of Consciousness: awake, alert , oriented, drowsy and patient cooperative  Airway & Oxygen Therapy: Patient Spontanous Breathing and Patient connected to face mask oxygen  Post-op Assessment: Report given to RN and Post -op Vital signs reviewed and stable  Post vital signs: Reviewed and stable  Last Vitals:  Vitals Value Taken Time  BP 173/90 02/09/20 1327  Temp    Pulse 103 02/09/20 1328  Resp 15 02/09/20 1328  SpO2 99 % 02/09/20 1328  Vitals shown include unvalidated device data.  Last Pain:  Vitals:   02/09/20 1133  TempSrc: Oral  PainSc: 2       Patients Stated Pain Goal: 5 (91/91/66 0600)  Complications: No complications documented.

## 2020-02-09 NOTE — Op Note (Addendum)
Breast Reduction Op note:    DATE OF PROCEDURE: 02/09/2020  LOCATION: [place]  SURGEON: Lyndee Leo Sanger Yeison Sippel, DO  ASSISTANT: Roetta Sessions, PA  PREOPERATIVE DIAGNOSIS 1. Left breast mastectomy 2. Breast asymmetry after treatment for cancer 3. Macromastia / Neck Pain / Back Pain  POSTOPERATIVE DIAGNOSIS Same  PROCEDURES 1. Right breast reduction.  Right reduction 063 g  COMPLICATIONS: None.  DRAINS: none  INDICATIONS FOR PROCEDURE Lauren Garrison is a 59 y.o. year-old female born on [birth year],with a history of left breast cancer.  She had reconstruction and then decised to have the expander removed and not proceed.  The right can be made smaller so she can have a better sized prosthetic.    MRN: [id number]  CONSENT Informed consent was obtained directly from the patient. The risks, benefits and alternatives were fully discussed. Specific risks including but not limited to bleeding, infection, hematoma, seroma, scarring, pain, nipple necrosis, asymmetry, poor cosmetic results, and need for further surgery were discussed. The patient had ample opportunity to have her questions answered to her satisfaction.  DESCRIPTION OF PROCEDURE  Patient was brought into the operating room and placed in a supine position.  SCDs were placed and appropriate padding was performed.  Antibiotics were given. The patient underwent general anesthesia and the chest was prepped and draped in a sterile fashion.  A timeout was performed and all information was confirmed to be correct.  Right side: Preoperative markings were confirmed.  Incision lines were injected with 1% Xylocaine with epinephrine.  After waiting for vasoconstriction, the marked lines were incised.  A Wise-pattern superomedial breast reduction was performed by de-epithelializing the pedicle, using bovie to create the superomedial pedicle, and removing breast tissue from the superior, lateral, and inferior  portions of the breast.  Care was taken to not undermine the breast pedicle. Hemostasis was achieved.  The nipple was gently rotated into position and the soft tissue closed with 4-0 Monocryl.   The pocket was irrigated and hemostasis confirmed.  The deep tissues were approximated with 3-0 PDS and Monocryl sutures and the skin was closed with deep dermal and subcuticular 4-0 Monocryl sutures.  The nipple and skin flaps had good capillary refill at the end of the procedure. Dermabond was applied.  A breast binder and ABDs were placed.  The nipple and skin flaps had good capillary refill at the end of the procedure.  The patient tolerated the procedure well. The patient was allowed to wake from anesthesia and taken to the recovery room in satisfactory condition.  The advanced practice practitioner (APP) assisted throughout the case.  The APP was essential in retraction and counter traction when needed to make the case progress smoothly.  This retraction and assistance made it possible to see the tissue plans for the procedure.  The assistance was needed for blood control, tissue re-approximation and assisted with closure of the incision site.

## 2020-02-13 ENCOUNTER — Encounter (HOSPITAL_BASED_OUTPATIENT_CLINIC_OR_DEPARTMENT_OTHER): Payer: Self-pay | Admitting: Plastic Surgery

## 2020-02-13 LAB — SURGICAL PATHOLOGY

## 2020-02-13 NOTE — Addendum Note (Signed)
Addendum  created 02/13/20 1018 by Tawni Millers, CRNA   Charge Capture section accepted

## 2020-02-16 ENCOUNTER — Telehealth: Payer: Self-pay | Admitting: Plastic Surgery

## 2020-02-16 ENCOUNTER — Other Ambulatory Visit: Payer: Self-pay

## 2020-02-16 DIAGNOSIS — C50412 Malignant neoplasm of upper-outer quadrant of left female breast: Secondary | ICD-10-CM

## 2020-02-16 MED ORDER — FLUCONAZOLE 150 MG PO TABS: 150.0000 mg | ORAL_TABLET | Freq: Once | ORAL | 0 refills | Status: AC

## 2020-02-16 NOTE — Telephone Encounter (Signed)
Rx sent to pharmacy. Please call patient and let her know it was sent to [facility] on [address].

## 2020-02-16 NOTE — Telephone Encounter (Signed)
Patient requested Diflucan antibiotic for yeast infection. Please call patient to advise when called in.

## 2020-02-17 ENCOUNTER — Encounter: Payer: Self-pay | Admitting: Surgical

## 2020-02-17 ENCOUNTER — Ambulatory Visit (INDEPENDENT_AMBULATORY_CARE_PROVIDER_SITE_OTHER): Payer: 59 | Admitting: Surgical

## 2020-02-17 ENCOUNTER — Other Ambulatory Visit: Payer: Self-pay

## 2020-02-17 VITALS — BP 137/99 | HR 98 | Temp 99.1°F

## 2020-02-17 DIAGNOSIS — N651 Disproportion of reconstructed breast: Secondary | ICD-10-CM

## 2020-02-17 DIAGNOSIS — Z9012 Acquired absence of left breast and nipple: Secondary | ICD-10-CM

## 2020-02-17 NOTE — Progress Notes (Signed)
Patient is a 59 year old female here for follow-up after her right breast reduction for symmetry on 02/09/2020 with Dr. Ulice Bold.  Pathology showed fibrocystic changes with usual ductal hyperplasia and calcifications, no evidence of malignancy.  Patient had 350 g removed from the right breast. Patient has a history of a left breast mastectomy followed by placement of tissue expander, she had some issues postoperatively and decided to have it removed and not replaced.  She subsequently underwent the right breast reduction for symmetry with a prosthesis.  Patient reports she is doing really well.  She is very pleased with her recovery thus far.  She reports that pain has been very mild.  She has not had any infectious symptoms.  She reports that she is traveling to [place] tomorrow with her grandson.  She has been spending time with him and is very happy about this.  She reports that she is very happy with the outcome of surgery.  Chaperone present on exam On exam left mastectomy incision intact.  The left mastectomy flaps are adherent to the chest wall.  No fluid palpated.  Right breast NAC is viable with good color.  Right breast incision intact.  Honeycomb and Steri-Strip dressing in place.  No erythema noted.  No fluid wave noted with palpation.   Recommend continue her compressive garment 24/7 for a total of 6 weeks postop. Recommend avoiding strenuous activity. Recommend ambulating frequently while on her road trip to decrease risk of DVT. Recommend calling the office with any questions or concerns, she is scheduled to follow-up in 1 week for reevaluation. There is no sign of infection, seroma, hematoma.

## 2020-02-20 ENCOUNTER — Encounter: Payer: Self-pay | Admitting: Adult Health

## 2020-02-20 ENCOUNTER — Inpatient Hospital Stay (HOSPITAL_BASED_OUTPATIENT_CLINIC_OR_DEPARTMENT_OTHER): Payer: 59 | Admitting: Adult Health

## 2020-02-20 ENCOUNTER — Inpatient Hospital Stay: Payer: 59 | Attending: Oncology

## 2020-02-20 ENCOUNTER — Other Ambulatory Visit: Payer: Self-pay

## 2020-02-20 ENCOUNTER — Inpatient Hospital Stay: Payer: 59

## 2020-02-20 VITALS — BP 140/69 | HR 99 | Temp 96.8°F | Resp 18 | Ht 65.0 in | Wt 248.9 lb

## 2020-02-20 DIAGNOSIS — Z170 Estrogen receptor positive status [ER+]: Secondary | ICD-10-CM | POA: Insufficient documentation

## 2020-02-20 DIAGNOSIS — L03313 Cellulitis of chest wall: Secondary | ICD-10-CM | POA: Diagnosis not present

## 2020-02-20 DIAGNOSIS — C50412 Malignant neoplasm of upper-outer quadrant of left female breast: Secondary | ICD-10-CM

## 2020-02-20 DIAGNOSIS — Z5111 Encounter for antineoplastic chemotherapy: Secondary | ICD-10-CM | POA: Diagnosis not present

## 2020-02-20 DIAGNOSIS — C773 Secondary and unspecified malignant neoplasm of axilla and upper limb lymph nodes: Secondary | ICD-10-CM | POA: Insufficient documentation

## 2020-02-20 DIAGNOSIS — Z79899 Other long term (current) drug therapy: Secondary | ICD-10-CM | POA: Insufficient documentation

## 2020-02-20 LAB — CMP (CANCER CENTER ONLY)
ALT: 24 U/L (ref 0–44)
AST: 26 U/L (ref 15–41)
Albumin: 3.4 g/dL — ABNORMAL LOW (ref 3.5–5.0)
Alkaline Phosphatase: 108 U/L (ref 38–126)
Anion gap: 6 (ref 5–15)
BUN: 14 mg/dL (ref 6–20)
CO2: 30 mmol/L (ref 22–32)
Calcium: 9.4 mg/dL (ref 8.9–10.3)
Chloride: 109 mmol/L (ref 98–111)
Creatinine: 0.97 mg/dL (ref 0.44–1.00)
GFR, Estimated: >60 mL/min (ref >60)
Glucose, Bld: 118 mg/dL — ABNORMAL HIGH (ref 70–99)
Potassium: 3.4 mmol/L — ABNORMAL LOW (ref 3.5–5.1)
Sodium: 145 mmol/L (ref 135–145)
Total Bilirubin: 0.7 mg/dL (ref 0.3–1.2)
Total Protein: 7.3 g/dL (ref 6.5–8.1)

## 2020-02-20 LAB — CBC WITH DIFFERENTIAL (CANCER CENTER ONLY)
Abs Immature Granulocytes: 0.01 10*3/uL (ref 0.00–0.07)
Basophils Absolute: 0 10*3/uL (ref 0.0–0.1)
Basophils Relative: 1 %
Eosinophils Absolute: 0.1 10*3/uL (ref 0.0–0.5)
Eosinophils Relative: 3 %
HCT: 38.2 % (ref 36.0–46.0)
Hemoglobin: 12.2 g/dL (ref 12.0–15.0)
Immature Granulocytes: 0 %
Lymphocytes Relative: 41 %
Lymphs Abs: 1.6 10*3/uL (ref 0.7–4.0)
MCH: 27.7 pg (ref 26.0–34.0)
MCHC: 31.9 g/dL (ref 30.0–36.0)
MCV: 86.6 fL (ref 80.0–100.0)
Monocytes Absolute: 0.5 10*3/uL (ref 0.1–1.0)
Monocytes Relative: 14 %
Neutro Abs: 1.6 10*3/uL — ABNORMAL LOW (ref 1.7–7.7)
Neutrophils Relative %: 41 %
Platelet Count: 238 10*3/uL (ref 150–400)
RBC: 4.41 MIL/uL (ref 3.87–5.11)
RDW: 18.3 % — ABNORMAL HIGH (ref 11.5–15.5)
WBC Count: 3.9 10*3/uL — ABNORMAL LOW (ref 4.0–10.5)
nRBC: 0 % (ref 0.0–0.2)

## 2020-02-20 MED ORDER — METHOTREXATE SODIUM (PF) CHEMO INJECTION 250 MG/10ML
40.0000 mg/m2 | Freq: Once | INTRAMUSCULAR | Status: AC
  Administered 2020-02-20: 89.25 mg via INTRAVENOUS
  Filled 2020-02-20: qty 3.57

## 2020-02-20 MED ORDER — PALONOSETRON HCL INJECTION 0.25 MG/5ML
0.2500 mg | Freq: Once | INTRAVENOUS | Status: AC
  Administered 2020-02-20: 0.25 mg via INTRAVENOUS

## 2020-02-20 MED ORDER — SODIUM CHLORIDE 0.9 % IV SOLN
10.0000 mg | Freq: Once | INTRAVENOUS | Status: AC
  Administered 2020-02-20: 10 mg via INTRAVENOUS
  Filled 2020-02-20: qty 10

## 2020-02-20 MED ORDER — FLUOROURACIL CHEMO INJECTION 2.5 GM/50ML
600.0000 mg/m2 | Freq: Once | INTRAVENOUS | Status: AC
  Administered 2020-02-20: 1350 mg via INTRAVENOUS
  Filled 2020-02-20: qty 27

## 2020-02-20 MED ORDER — PALONOSETRON HCL INJECTION 0.25 MG/5ML
INTRAVENOUS | Status: AC
  Filled 2020-02-20: qty 5

## 2020-02-20 MED ORDER — SODIUM CHLORIDE 0.9 % IV SOLN
600.0000 mg/m2 | Freq: Once | INTRAVENOUS | Status: AC
  Administered 2020-02-20: 1340 mg via INTRAVENOUS
  Filled 2020-02-20: qty 67

## 2020-02-20 MED ORDER — SODIUM CHLORIDE 0.9 % IV SOLN
Freq: Once | INTRAVENOUS | Status: AC
  Filled 2020-02-20: qty 250
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
## 2020-02-20 NOTE — Patient Instructions (Signed)
[facility] Discharge Instructions for Patients Receiving Chemotherapy  Today you received the following chemotherapy agents: Cytoxan, Methotrexate, and Flurouracil  To help prevent nausea and vomiting after your treatment, we encourage you to take your nausea medication as directed by your MD.   If you develop nausea and vomiting that is not controlled by your nausea medication, call the clinic.   BELOW ARE SYMPTOMS THAT SHOULD BE REPORTED IMMEDIATELY:  *FEVER GREATER THAN 100.5 F  *CHILLS WITH OR WITHOUT FEVER  NAUSEA AND VOMITING THAT IS NOT CONTROLLED WITH YOUR NAUSEA MEDICATION  *UNUSUAL SHORTNESS OF BREATH  *UNUSUAL BRUISING OR BLEEDING  TENDERNESS IN MOUTH AND THROAT WITH OR WITHOUT PRESENCE OF ULCERS  *URINARY PROBLEMS  *BOWEL PROBLEMS  UNUSUAL RASH Items with * indicate a potential emergency and should be followed up as soon as possible.  Feel free to call the clinic should you have any questions or concerns. The clinic phone number is [phone].  Please show the CHEMO ALERT CARD at check-in to the Emergency Department and triage nurse.   

## 2020-02-20 NOTE — Progress Notes (Signed)
Pt. stable for discharge. Left via ambulation, no respiratory distress noted. 

## 2020-02-24 ENCOUNTER — Ambulatory Visit (INDEPENDENT_AMBULATORY_CARE_PROVIDER_SITE_OTHER): Payer: 59 | Admitting: Plastic Surgery

## 2020-02-24 ENCOUNTER — Other Ambulatory Visit: Payer: Self-pay

## 2020-02-24 ENCOUNTER — Encounter: Payer: Self-pay | Admitting: Plastic Surgery

## 2020-02-24 VITALS — BP 142/76 | HR 97

## 2020-02-24 DIAGNOSIS — N651 Disproportion of reconstructed breast: Secondary | ICD-10-CM

## 2020-02-24 NOTE — Progress Notes (Signed)
The patient is a 60 year old female here for postop follow-up.  She had a right breast reduction.  She had had a left mastectomy with the start of reconstruction when she decided to hold it.  The expander was removed.  She is healing very nicely from the reduction.  The patient states she still has a weird feeling when she swallows she can feel the food or liquid going all the way down.  I am concerned about possible esophagitis.  I have encouraged her to seek an appointment with her PCP to discuss it further.  I also placed a note to the front staff here to help facilitate the appointment with her PCP.  I would like to see her back and 2 weeks to check her reduction.  We will plan to remove any remaining Steri-Strips at that time or she can leave them on if they are not bothering her.  I replaced a few of them today that were close to the inframammary fold.  Continue with the sports bra.  We will plan to see her in follow-up but she knows to call us if she has any questions or concerns.

## 2020-03-07 ENCOUNTER — Other Ambulatory Visit: Payer: Self-pay | Admitting: Internal Medicine

## 2020-03-07 DIAGNOSIS — I10 Essential (primary) hypertension: Secondary | ICD-10-CM

## 2020-03-12 ENCOUNTER — Other Ambulatory Visit: Payer: Self-pay | Admitting: *Deleted

## 2020-03-12 DIAGNOSIS — Z170 Estrogen receptor positive status [ER+]: Secondary | ICD-10-CM

## 2020-03-12 DIAGNOSIS — C50412 Malignant neoplasm of upper-outer quadrant of left female breast: Secondary | ICD-10-CM
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
## 2020-03-13 ENCOUNTER — Inpatient Hospital Stay (HOSPITAL_BASED_OUTPATIENT_CLINIC_OR_DEPARTMENT_OTHER): Payer: 59 | Admitting: Oncology

## 2020-03-13 ENCOUNTER — Inpatient Hospital Stay: Payer: 59

## 2020-03-13 ENCOUNTER — Other Ambulatory Visit: Payer: Self-pay

## 2020-03-13 ENCOUNTER — Encounter: Payer: Self-pay | Admitting: *Deleted

## 2020-03-13 VITALS — BP 127/63 | HR 102 | Temp 97.3°F | Resp 18 | Ht 65.0 in | Wt 248.5 lb

## 2020-03-13 VITALS — HR 97

## 2020-03-13 DIAGNOSIS — Z170 Estrogen receptor positive status [ER+]: Secondary | ICD-10-CM

## 2020-03-13 DIAGNOSIS — N651 Disproportion of reconstructed breast: Secondary | ICD-10-CM | POA: Diagnosis not present

## 2020-03-13 DIAGNOSIS — C50412 Malignant neoplasm of upper-outer quadrant of left female breast: Secondary | ICD-10-CM

## 2020-03-13 DIAGNOSIS — G43009 Migraine without aura, not intractable, without status migrainosus: Secondary | ICD-10-CM | POA: Diagnosis not present

## 2020-03-13 DIAGNOSIS — Z5111 Encounter for antineoplastic chemotherapy: Secondary | ICD-10-CM | POA: Diagnosis not present

## 2020-03-13 LAB — CBC WITH DIFFERENTIAL (CANCER CENTER ONLY)
Abs Immature Granulocytes: 0.01 10*3/uL (ref 0.00–0.07)
Basophils Absolute: 0 10*3/uL (ref 0.0–0.1)
Basophils Relative: 1 %
Eosinophils Absolute: 0.1 10*3/uL (ref 0.0–0.5)
Eosinophils Relative: 3 %
HCT: 40.8 % (ref 36.0–46.0)
Hemoglobin: 13.1 g/dL (ref 12.0–15.0)
Immature Granulocytes: 0 %
Lymphocytes Relative: 58 %
Lymphs Abs: 2 10*3/uL (ref 0.7–4.0)
MCH: 28.4 pg (ref 26.0–34.0)
MCHC: 32.1 g/dL (ref 30.0–36.0)
MCV: 88.5 fL (ref 80.0–100.0)
Monocytes Absolute: 0.5 10*3/uL (ref 0.1–1.0)
Monocytes Relative: 15 %
Neutro Abs: 0.8 10*3/uL — ABNORMAL LOW (ref 1.7–7.7)
Neutrophils Relative %: 23 %
Platelet Count: 280 10*3/uL (ref 150–400)
RBC: 4.61 MIL/uL (ref 3.87–5.11)
RDW: 17.5 % — ABNORMAL HIGH (ref 11.5–15.5)
WBC Count: 3.4 10*3/uL — ABNORMAL LOW (ref 4.0–10.5)
nRBC: 0 % (ref 0.0–0.2)

## 2020-03-13 LAB — CMP (CANCER CENTER ONLY)
ALT: 24 U/L (ref 0–44)
AST: 18 U/L (ref 15–41)
Albumin: 3.5 g/dL (ref 3.5–5.0)
Alkaline Phosphatase: 119 U/L (ref 38–126)
Anion gap: 9 (ref 5–15)
BUN: 9 mg/dL (ref 6–20)
CO2: 26 mmol/L (ref 22–32)
Calcium: 9.2 mg/dL (ref 8.9–10.3)
Chloride: 106 mmol/L (ref 98–111)
Creatinine: 1.01 mg/dL — ABNORMAL HIGH (ref 0.44–1.00)
GFR, Estimated: >60 mL/min (ref >60)
Glucose, Bld: 135 mg/dL — ABNORMAL HIGH (ref 70–99)
Potassium: 3.3 mmol/L — ABNORMAL LOW (ref 3.5–5.1)
Sodium: 141 mmol/L (ref 135–145)
Total Bilirubin: 0.5 mg/dL (ref 0.3–1.2)
Total Protein: 7.5 g/dL (ref 6.5–8.1)

## 2020-03-13 MED ORDER — SODIUM CHLORIDE 0.9 % IV SOLN
10.0000 mg | Freq: Once | INTRAVENOUS | Status: AC
  Administered 2020-03-13: 10 mg via INTRAVENOUS
  Filled 2020-03-13: qty 10

## 2020-03-13 MED ORDER — SODIUM CHLORIDE 0.9 % IV SOLN
Freq: Once | INTRAVENOUS | Status: AC
  Filled 2020-03-13: qty 250

## 2020-03-13 MED ORDER — SODIUM CHLORIDE 0.9 % IV SOLN
600.0000 mg/m2 | Freq: Once | INTRAVENOUS | Status: AC
  Administered 2020-03-13: 1340 mg via INTRAVENOUS
  Filled 2020-03-13: qty 67

## 2020-03-13 MED ORDER — PALONOSETRON HCL INJECTION 0.25 MG/5ML
0.2500 mg | Freq: Once | INTRAVENOUS | Status: AC
  Administered 2020-03-13: 0.25 mg via INTRAVENOUS

## 2020-03-13 MED ORDER — METHOTREXATE SODIUM (PF) CHEMO INJECTION 250 MG/10ML
40.0000 mg/m2 | Freq: Once | INTRAMUSCULAR | Status: AC
  Administered 2020-03-13: 89.25 mg via INTRAVENOUS
  Filled 2020-03-13: qty 3.57

## 2020-03-13 MED ORDER — FLUOROURACIL CHEMO INJECTION 2.5 GM/50ML
600.0000 mg/m2 | Freq: Once | INTRAVENOUS | Status: AC
  Administered 2020-03-13: 1350 mg via INTRAVENOUS
  Filled 2020-03-13: qty 27

## 2020-03-13 MED ORDER — PALONOSETRON HCL INJECTION 0.25 MG/5ML
INTRAVENOUS | Status: AC
  Filled 2020-03-13: qty 5

## 2020-03-13 NOTE — Addendum Note (Signed)
Addended by: Chauncey Cruel on: 03/13/2020 02:36 PM   Modules accepted: Orders

## 2020-03-13 NOTE — Progress Notes (Signed)
Per Dr. Jana Hakim ok to treat today with ANC of 0.8.  Wants pt to return Thursday, Friday, & Saturday for injections.

## 2020-03-13 NOTE — Patient Instructions (Signed)
[facility] Discharge Instructions for Patients Receiving Chemotherapy  Today you received the following chemotherapy agents: Cytoxan, Methotrexate, and Flurouracil  To help prevent nausea and vomiting after your treatment, we encourage you to take your nausea medication as directed by your MD.   If you develop nausea and vomiting that is not controlled by your nausea medication, call the clinic.   BELOW ARE SYMPTOMS THAT SHOULD BE REPORTED IMMEDIATELY:  *FEVER GREATER THAN 100.5 F  *CHILLS WITH OR WITHOUT FEVER  NAUSEA AND VOMITING THAT IS NOT CONTROLLED WITH YOUR NAUSEA MEDICATION  *UNUSUAL SHORTNESS OF BREATH  *UNUSUAL BRUISING OR BLEEDING  TENDERNESS IN MOUTH AND THROAT WITH OR WITHOUT PRESENCE OF ULCERS  *URINARY PROBLEMS  *BOWEL PROBLEMS  UNUSUAL RASH Items with * indicate a potential emergency and should be followed up as soon as possible.  Feel free to call the clinic should you have any questions or concerns. The clinic phone number is [phone].  Please show the CHEMO ALERT CARD at check-in to the Emergency Department and triage nurse.   

## 2020-03-14 ENCOUNTER — Telehealth: Payer: Self-pay | Admitting: Oncology

## 2020-03-14 NOTE — Telephone Encounter (Signed)
Scheduled appts per 1/25 los. Pt to get updated appt calendar at next visit per appt notes.  

## 2020-03-14 NOTE — Telephone Encounter (Signed)
Scheduled appt per 1/25 sch msg - pt aware of appts added

## 2020-03-15 ENCOUNTER — Inpatient Hospital Stay: Payer: 59

## 2020-03-15 ENCOUNTER — Other Ambulatory Visit: Payer: Self-pay | Admitting: Oncology

## 2020-03-15 ENCOUNTER — Other Ambulatory Visit: Payer: Self-pay

## 2020-03-15 VITALS — BP 164/82 | HR 71

## 2020-03-15 DIAGNOSIS — Z5111 Encounter for antineoplastic chemotherapy: Secondary | ICD-10-CM | POA: Diagnosis not present

## 2020-03-15 DIAGNOSIS — L03313 Cellulitis of chest wall: Secondary | ICD-10-CM

## 2020-03-15 MED ORDER — FILGRASTIM-SNDZ 480 MCG/0.8ML IJ SOSY
480.0000 ug | PREFILLED_SYRINGE | Freq: Once | INTRAMUSCULAR | Status: AC
  Administered 2020-03-15: 480 ug via SUBCUTANEOUS

## 2020-03-15 MED ORDER — FILGRASTIM-SNDZ 480 MCG/0.8ML IJ SOSY
PREFILLED_SYRINGE | INTRAMUSCULAR | Status: AC
  Filled 2020-03-15: qty 0.8

## 2020-03-15 NOTE — Patient Instructions (Signed)

## 2020-03-15 NOTE — Progress Notes (Unsigned)
   Dr. Isidore Moos does not feel additional radiation is indicated in this case.  "I would not recommend more RT for the 1/9 + nodes. Hopeful the chemo and antiestrogens will keep her in remission. thanks "

## 2020-03-16 ENCOUNTER — Encounter: Payer: Self-pay | Admitting: Plastic Surgery

## 2020-03-16 ENCOUNTER — Inpatient Hospital Stay: Payer: 59

## 2020-03-16 ENCOUNTER — Other Ambulatory Visit: Payer: Self-pay

## 2020-03-16 ENCOUNTER — Ambulatory Visit (INDEPENDENT_AMBULATORY_CARE_PROVIDER_SITE_OTHER): Payer: 59 | Admitting: Plastic Surgery

## 2020-03-16 VITALS — BP 148/74 | HR 89 | Temp 98.4°F | Ht 65.0 in | Wt 248.0 lb

## 2020-03-16 DIAGNOSIS — Z5111 Encounter for antineoplastic chemotherapy: Secondary | ICD-10-CM | POA: Diagnosis not present

## 2020-03-16 DIAGNOSIS — Z9012 Acquired absence of left breast and nipple: Secondary | ICD-10-CM

## 2020-03-16 DIAGNOSIS — N651 Disproportion of reconstructed breast: Secondary | ICD-10-CM

## 2020-03-16 DIAGNOSIS — L03313 Cellulitis of chest wall: Secondary | ICD-10-CM

## 2020-03-16 MED ORDER — FILGRASTIM-SNDZ 480 MCG/0.8ML IJ SOSY
PREFILLED_SYRINGE | INTRAMUSCULAR | Status: AC
  Filled 2020-03-16: qty 0.8

## 2020-03-16 MED ORDER — FILGRASTIM-SNDZ 480 MCG/0.8ML IJ SOSY
480.0000 ug | PREFILLED_SYRINGE | Freq: Once | INTRAMUSCULAR | Status: AC
  Administered 2020-03-16: 480 ug via SUBCUTANEOUS

## 2020-03-16 NOTE — Patient Instructions (Signed)

## 2020-03-16 NOTE — Progress Notes (Signed)
   Subjective:    Patient ID: Lauren Garrison, female    DOB: 01/13/1961, 60 y.o.   MRN: [id number]  The patient is a 60 year old female here for follow-up on her breast surgery.  She had a left mastectomy and expander placement.  She had complications and decided on removal of the expander.  She has developed a lot of very tight scar tissue on the left breast.  She went ahead and had a right breast reduction for some improved symmetry.  That seems to be healing nicely.  There is no sign of infection.  She is going through chemotherapy and is interested in a left breast reconstruction once her chemotherapy is complete.  She is 5 feet 5 inches tall weighs 248 pounds.  She knows that she will have to improve her BMI prior to any free flap surgery.  She is hoping she can decrease her weight by 50 pounds.   Review of Systems  Constitutional: Negative.   HENT: Negative.   Eyes: Negative.   Respiratory: Negative.   Cardiovascular: Negative.   Gastrointestinal: Negative.   Genitourinary: Negative.        Objective:   Physical Exam Vitals and nursing note reviewed.  Constitutional:      Appearance: Normal appearance.  HENT:     Head: Normocephalic.  Cardiovascular:     Rate and Rhythm: Normal rate.     Pulses: Normal pulses.  Pulmonary:     Effort: Pulmonary effort is normal.  Neurological:     Mental Status: She is alert. Mental status is at baseline.  Psychiatric:        Mood and Affect: Mood normal.        Behavior: Behavior normal.         Assessment & Plan:     ICD-10-CM   1. Breast asymmetry following reconstructive surgery  N65.1   2. Acquired absence of left breast  Z90.12     I am happy to initiate the referral so that she can get more information about left breast reconstruction.  We will set her up to see Dr. Veda Canning. Pictures were obtained of the patient and placed in the chart with the patient's or guardian's permission.

## 2020-03-17 ENCOUNTER — Inpatient Hospital Stay: Payer: 59

## 2020-03-17 VITALS — BP 144/74 | HR 100 | Temp 98.3°F | Resp 18

## 2020-03-17 DIAGNOSIS — Z5111 Encounter for antineoplastic chemotherapy: Secondary | ICD-10-CM | POA: Diagnosis not present

## 2020-03-17 DIAGNOSIS — L03313 Cellulitis of chest wall: Secondary | ICD-10-CM

## 2020-03-17 MED ORDER — FILGRASTIM-SNDZ 480 MCG/0.8ML IJ SOSY
480.0000 ug | PREFILLED_SYRINGE | Freq: Once | INTRAMUSCULAR | Status: AC
  Administered 2020-03-17: 480 ug via SUBCUTANEOUS

## 2020-03-17 NOTE — Patient Instructions (Signed)

## 2020-03-19 ENCOUNTER — Encounter: Payer: Self-pay | Admitting: *Deleted

## 2020-03-20 ENCOUNTER — Other Ambulatory Visit: Payer: Self-pay

## 2020-03-20 ENCOUNTER — Ambulatory Visit: Payer: 59 | Attending: Plastic Surgery

## 2020-03-20 DIAGNOSIS — M25512 Pain in left shoulder: Secondary | ICD-10-CM

## 2020-03-20 DIAGNOSIS — R2230 Localized swelling, mass and lump, unspecified upper limb: Secondary | ICD-10-CM

## 2020-03-20 DIAGNOSIS — Z483 Aftercare following surgery for neoplasm: Secondary | ICD-10-CM | POA: Diagnosis present

## 2020-03-20 DIAGNOSIS — R222 Localized swelling, mass and lump, trunk: Secondary | ICD-10-CM | POA: Insufficient documentation

## 2020-03-20 DIAGNOSIS — R293 Abnormal posture: Secondary | ICD-10-CM
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
## 2020-03-20 NOTE — Patient Instructions (Signed)
Patient was instructed today in a home exercise program today for post op shoulder range of motion. These included active assist shoulder flexion in supine, scapular retraction, and hands behind head external rotation in supine.  She was encouraged to do these twice a day, holding 3 seconds and repeating 5 times .

## 2020-03-27 ENCOUNTER — Ambulatory Visit: Payer: 59

## 2020-03-29 ENCOUNTER — Ambulatory Visit: Payer: 59

## 2020-04-03 ENCOUNTER — Other Ambulatory Visit: Payer: Self-pay

## 2020-04-03 ENCOUNTER — Inpatient Hospital Stay: Payer: 59 | Attending: Oncology

## 2020-04-03 ENCOUNTER — Inpatient Hospital Stay: Payer: 59

## 2020-04-03 ENCOUNTER — Inpatient Hospital Stay (HOSPITAL_BASED_OUTPATIENT_CLINIC_OR_DEPARTMENT_OTHER): Payer: 59 | Admitting: Medical

## 2020-04-03 ENCOUNTER — Other Ambulatory Visit: Payer: Self-pay | Admitting: Oncology

## 2020-04-03 VITALS — BP 155/73 | HR 99 | Temp 98.8°F | Resp 18 | Wt 246.0 lb

## 2020-04-03 DIAGNOSIS — Z170 Estrogen receptor positive status [ER+]: Secondary | ICD-10-CM

## 2020-04-03 DIAGNOSIS — C50412 Malignant neoplasm of upper-outer quadrant of left female breast: Secondary | ICD-10-CM | POA: Diagnosis not present

## 2020-04-03 DIAGNOSIS — E876 Hypokalemia: Secondary | ICD-10-CM

## 2020-04-03 DIAGNOSIS — Z5111 Encounter for antineoplastic chemotherapy: Secondary | ICD-10-CM | POA: Diagnosis present

## 2020-04-03 LAB — CMP (CANCER CENTER ONLY)
ALT: 21 U/L (ref 0–44)
AST: 18 U/L (ref 15–41)
Albumin: 3.7 g/dL (ref 3.5–5.0)
Alkaline Phosphatase: 117 U/L (ref 38–126)
Anion gap: 9 (ref 5–15)
BUN: 8 mg/dL (ref 6–20)
CO2: 25 mmol/L (ref 22–32)
Calcium: 9.1 mg/dL (ref 8.9–10.3)
Chloride: 106 mmol/L (ref 98–111)
Creatinine: 0.85 mg/dL (ref 0.44–1.00)
GFR, Estimated: >60 mL/min (ref >60)
Glucose, Bld: 122 mg/dL — ABNORMAL HIGH (ref 70–99)
Potassium: 3.2 mmol/L — ABNORMAL LOW (ref 3.5–5.1)
Sodium: 140 mmol/L (ref 135–145)
Total Bilirubin: 0.3 mg/dL (ref 0.3–1.2)
Total Protein: 7.6 g/dL (ref 6.5–8.1)

## 2020-04-03 LAB — CBC WITH DIFFERENTIAL (CANCER CENTER ONLY)
Abs Immature Granulocytes: 0.01 10*3/uL (ref 0.00–0.07)
Basophils Absolute: 0 10*3/uL (ref 0.0–0.1)
Basophils Relative: 1 %
Eosinophils Absolute: 0.1 10*3/uL (ref 0.0–0.5)
Eosinophils Relative: 2 %
HCT: 40.7 % (ref 36.0–46.0)
Hemoglobin: 13.2 g/dL (ref 12.0–15.0)
Immature Granulocytes: 0 %
Lymphocytes Relative: 45 %
Lymphs Abs: 1.9 10*3/uL (ref 0.7–4.0)
MCH: 28.6 pg (ref 26.0–34.0)
MCHC: 32.4 g/dL (ref 30.0–36.0)
MCV: 88.3 fL (ref 80.0–100.0)
Monocytes Absolute: 0.5 10*3/uL (ref 0.1–1.0)
Monocytes Relative: 12 %
Neutro Abs: 1.7 10*3/uL (ref 1.7–7.7)
Neutrophils Relative %: 40 %
Platelet Count: 318 10*3/uL (ref 150–400)
RBC: 4.61 MIL/uL (ref 3.87–5.11)
RDW: 15.9 % — ABNORMAL HIGH (ref 11.5–15.5)
WBC Count: 4.4 10*3/uL (ref 4.0–10.5)
nRBC: 0 % (ref 0.0–0.2)

## 2020-04-03 MED ORDER — POTASSIUM CHLORIDE CRYS ER 20 MEQ PO TBCR
40.0000 meq | EXTENDED_RELEASE_TABLET | Freq: Once | ORAL | Status: AC
  Administered 2020-04-03: 40 meq via ORAL

## 2020-04-03 MED ORDER — POTASSIUM CHLORIDE CRYS ER 20 MEQ PO TBCR
EXTENDED_RELEASE_TABLET | ORAL | Status: AC
  Filled 2020-04-03: qty 1

## 2020-04-03 MED ORDER — DEXAMETHASONE SODIUM PHOSPHATE 10 MG/ML IJ SOLN
INTRAMUSCULAR | Status: AC
  Filled 2020-04-03: qty 1

## 2020-04-03 MED ORDER — SODIUM CHLORIDE 0.9 % IV SOLN
10.0000 mg | Freq: Once | INTRAVENOUS | Status: AC
  Administered 2020-04-03: 10 mg via INTRAVENOUS
  Filled 2020-04-03: qty 10

## 2020-04-03 MED ORDER — SODIUM CHLORIDE 0.9 % IV SOLN
Freq: Once | INTRAVENOUS | Status: AC
  Filled 2020-04-03: qty 250

## 2020-04-03 MED ORDER — PALONOSETRON HCL INJECTION 0.25 MG/5ML
INTRAVENOUS | Status: AC
  Filled 2020-04-03: qty 5

## 2020-04-03 MED ORDER — POTASSIUM CHLORIDE CRYS ER 20 MEQ PO TBCR: 20.0000 meq | EXTENDED_RELEASE_TABLET | Freq: Every day | ORAL | 0 refills | Status: DC

## 2020-04-03 MED ORDER — METHOTREXATE SODIUM (PF) CHEMO INJECTION 250 MG/10ML
40.0000 mg/m2 | Freq: Once | INTRAMUSCULAR | Status: AC
Start: 2020-04-03 — End: 2020-04-03
  Administered 2020-04-03: 89.25 mg via INTRAVENOUS
  Filled 2020-04-03: qty 3.57

## 2020-04-03 MED ORDER — PALONOSETRON HCL INJECTION 0.25 MG/5ML
0.2500 mg | Freq: Once | INTRAVENOUS | Status: AC
  Administered 2020-04-03: 0.25 mg via INTRAVENOUS

## 2020-04-03 MED ORDER — SODIUM CHLORIDE 0.9 % IV SOLN
600.0000 mg/m2 | Freq: Once | INTRAVENOUS | Status: AC
  Administered 2020-04-03: 1340 mg via INTRAVENOUS
  Filled 2020-04-03: qty 67

## 2020-04-03 MED ORDER — FLUOROURACIL CHEMO INJECTION 2.5 GM/50ML
600.0000 mg/m2 | Freq: Once | INTRAVENOUS | Status: AC
  Administered 2020-04-03: 1350 mg via INTRAVENOUS
  Filled 2020-04-03: qty 27
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
## 2020-04-03 NOTE — Patient Instructions (Signed)
[place] Discharge Instructions for Patients Receiving Chemotherapy  Today you received the following chemotherapy agents: Cytoxan, Methotrexate, and Flurouracil  To help prevent nausea and vomiting after your treatment, we encourage you to take your nausea medication as directed by your MD.   If you develop nausea and vomiting that is not controlled by your nausea medication, call the clinic.   BELOW ARE SYMPTOMS THAT SHOULD BE REPORTED IMMEDIATELY:  *FEVER GREATER THAN 100.5 F  *CHILLS WITH OR WITHOUT FEVER  NAUSEA AND VOMITING THAT IS NOT CONTROLLED WITH YOUR NAUSEA MEDICATION  *UNUSUAL SHORTNESS OF BREATH  *UNUSUAL BRUISING OR BLEEDING  TENDERNESS IN MOUTH AND THROAT WITH OR WITHOUT PRESENCE OF ULCERS  *URINARY PROBLEMS  *BOWEL PROBLEMS  UNUSUAL RASH Items with * indicate a potential emergency and should be followed up as soon as possible.  Feel free to call the clinic should you have any questions or concerns. The clinic phone number is [phone].  Please show the [place] at check-in to the Emergency Department and triage nurse.

## 2020-04-03 NOTE — Patient Instructions (Signed)
Rehydration, Adult Rehydration is the replacement of body fluids, salts, and minerals (electrolytes) that are lost during dehydration. Dehydration is when there is not enough water or other fluids in the body. This happens when you lose more fluids than you take in. Common causes of dehydration include:  Not drinking enough fluids. This can occur when you are ill or doing activities that require a lot of energy, especially in hot weather.  Conditions that cause loss of water or other fluids, such as diarrhea, vomiting, sweating, or urinating a lot.  Other illnesses, such as fever or infection.  Certain medicines, such as those that remove excess fluid from the body (diuretics). Symptoms of mild or moderate dehydration may include thirst, dry lips and mouth, and dizziness. Symptoms of severe dehydration may include increased heart rate, confusion, fainting, and not urinating. For severe dehydration, you may need to get fluids through an IV at the hospital. For mild or moderate dehydration, you can usually rehydrate at home by drinking certain fluids as told by your health care provider. What are the risks? Generally, rehydration is safe. However, taking in too much fluid (overhydration) can be a problem. This is rare. Overhydration can cause an electrolyte imbalance, kidney failure, or a decrease in salt (sodium) levels in the body. Supplies needed You will need an oral rehydration solution (ORS) if your health care provider tells you to use one. This is a drink to treat dehydration. It can be found in pharmacies and retail stores. How to rehydrate Fluids Follow instructions from your health care provider for rehydration. The kind of fluid and the amount you should drink depend on your condition. In general, you should choose drinks that you prefer.  If told by your health care provider, drink an ORS. ? Make an ORS by following instructions on the package. ? Start by drinking small amounts,  about  cup (120 mL) every 5-10 minutes. ? Slowly increase how much you drink until you have taken the amount recommended by your health care provider.  Drink enough clear fluids to keep your urine pale yellow. If you were told to drink an ORS, finish it first, then start slowly drinking other clear fluids. Drink fluids such as: ? Water. This includes sparkling water and flavored water. Drinking only water can lead to having too little sodium in your body (hyponatremia). Follow the advice of your health care provider. ? Water from ice chips you suck on. ? Fruit juice with water you add to it (diluted). ? Sports drinks. ? Hot or cold herbal teas. ? Broth-based soups. ? Milk or milk products. Food Follow instructions from your health care provider about what to eat while you rehydrate. Your health care provider may recommend that you slowly begin eating regular foods in small amounts.  Eat foods that contain a healthy balance of electrolytes, such as bananas, oranges, potatoes, tomatoes, and spinach.  Avoid foods that are greasy or contain a lot of sugar. In some cases, you may get nutrition through a feeding tube that is passed through your nose and into your stomach (nasogastric tube, or NG tube). This may be done if you have uncontrolled vomiting or diarrhea.   Beverages to avoid Certain beverages may make dehydration worse. While you rehydrate, avoid drinking alcohol.   How to tell if you are recovering from dehydration You may be recovering from dehydration if:  You are urinating more often than before you started rehydrating.  Your urine is pale yellow.  Your energy level   improves.  You vomit less frequently.  You have diarrhea less frequently.  Your appetite improves or returns to normal.  You feel less dizzy or less light-headed.  Your skin tone and color start to look more normal. Follow these instructions at home:  Take over-the-counter and prescription medicines only  as told by your health care provider.  Do not take sodium tablets. Doing this can lead to having too much sodium in your body (hypernatremia). Contact a health care provider if:  You continue to have symptoms of mild or moderate dehydration, such as: ? Thirst. ? Dry lips. ? Slightly dry mouth. ? Dizziness. ? Dark urine or less urine than normal. ? Muscle cramps.  You continue to vomit or have diarrhea. Get help right away if you:  Have symptoms of dehydration that get worse.  Have a fever.  Have a severe headache.  Have been vomiting and the following happens: ? Your vomiting gets worse or does not go away. ? Your vomit includes blood or green matter (bile). ? You cannot eat or drink without vomiting.  Have problems with urination or bowel movements, such as: ? Diarrhea that gets worse or does not go away. ? Blood in your stool (feces). This may cause stool to look black and tarry. ? Not urinating, or urinating only a small amount of very dark urine, within 6-8 hours.  Have trouble breathing.  Have symptoms that get worse with treatment. These symptoms may represent a serious problem that is an emergency. Do not wait to see if the symptoms will go away. Get medical help right away. Call your local emergency services (911 in the [place] Do not drive yourself to the hospital. Summary  Rehydration is the replacement of body fluids and minerals (electrolytes) that are lost during dehydration.  Follow instructions from your health care provider for rehydration. The kind of fluid and amount you should drink depend on your condition.  Slowly increase how much you drink until you have taken the amount recommended by your health care provider.  Contact your health care provider if you continue to show signs of mild or moderate dehydration. This information is not intended to replace advice given to you by your health care provider. Make sure you discuss any questions you have with  your health care provider. Document Revised: 04/06/2019 Document Reviewed: 02/14/2019 Elsevier Patient Education  2021 Elsevier Inc.  
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
## 2020-04-06 NOTE — Progress Notes (Signed)
I noted been entered on the patient's date of her visit stating that she was okay to treat.  Sandi Mealy, MHS, PA-C Physician Assistant

## 2020-04-18 ENCOUNTER — Telehealth: Payer: Self-pay | Admitting: Internal Medicine

## 2020-04-18 ENCOUNTER — Telehealth: Payer: Self-pay

## 2020-04-18 NOTE — Telephone Encounter (Signed)
The patient was wanting to get more information about the sleep tests that Dr. Jerilee Hoh was supposed to order for her.  The patient was also wanting to know if Dr. Jerilee Hoh will accept her son Tilford Pillar. Peri Maris. As a new patient. He is moving here from [place] to help take care of her through this cancer.  Tilford Pillar. Peri Maris. [phone] DOB 06/08[birth year]1981

## 2020-04-18 NOTE — Telephone Encounter (Signed)
Patient called to let us know that she started lactating last night.  She would like to know if this is normal.  Please call.

## 2020-04-19 NOTE — Telephone Encounter (Signed)
Yes on the son

## 2020-04-19 NOTE — Telephone Encounter (Signed)
Called the patient back and informed her that I spoke with [facility] regarding her message.  He stated that she should make an appointment to see her OB/GYN to discuss the lactating.    He stated that we can schedule an appointment to see her in three weeks or so,however should not be something associated with her reduction.  Patient verbalized understanding and agreed.//AB/CMA

## 2020-04-19 NOTE — Telephone Encounter (Signed)
Suzi Roots is working on the referral
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
## 2020-04-24 ENCOUNTER — Inpatient Hospital Stay (HOSPITAL_BASED_OUTPATIENT_CLINIC_OR_DEPARTMENT_OTHER): Payer: 59 | Admitting: Oncology

## 2020-04-24 ENCOUNTER — Inpatient Hospital Stay: Payer: 59

## 2020-04-24 ENCOUNTER — Other Ambulatory Visit: Payer: Self-pay

## 2020-04-24 ENCOUNTER — Inpatient Hospital Stay: Payer: 59 | Attending: Oncology

## 2020-04-24 VITALS — BP 155/75 | HR 88 | Temp 97.5°F | Resp 18 | Ht 65.0 in | Wt 248.9 lb

## 2020-04-24 DIAGNOSIS — E876 Hypokalemia: Secondary | ICD-10-CM

## 2020-04-24 DIAGNOSIS — C50412 Malignant neoplasm of upper-outer quadrant of left female breast: Secondary | ICD-10-CM | POA: Diagnosis not present

## 2020-04-24 DIAGNOSIS — C773 Secondary and unspecified malignant neoplasm of axilla and upper limb lymph nodes: Secondary | ICD-10-CM | POA: Diagnosis not present

## 2020-04-24 DIAGNOSIS — Z170 Estrogen receptor positive status [ER+]: Secondary | ICD-10-CM

## 2020-04-24 DIAGNOSIS — Z5111 Encounter for antineoplastic chemotherapy: Secondary | ICD-10-CM | POA: Diagnosis not present

## 2020-04-24 LAB — CBC WITH DIFFERENTIAL (CANCER CENTER ONLY)
Abs Immature Granulocytes: 0.01 10*3/uL (ref 0.00–0.07)
Basophils Absolute: 0 10*3/uL (ref 0.0–0.1)
Basophils Relative: 1 %
Eosinophils Absolute: 0.1 10*3/uL (ref 0.0–0.5)
Eosinophils Relative: 3 %
HCT: 40.9 % (ref 36.0–46.0)
Hemoglobin: 13.2 g/dL (ref 12.0–15.0)
Immature Granulocytes: 0 %
Lymphocytes Relative: 49 %
Lymphs Abs: 1.9 10*3/uL (ref 0.7–4.0)
MCH: 28.5 pg (ref 26.0–34.0)
MCHC: 32.3 g/dL (ref 30.0–36.0)
MCV: 88.3 fL (ref 80.0–100.0)
Monocytes Absolute: 0.6 10*3/uL (ref 0.1–1.0)
Monocytes Relative: 14 %
Neutro Abs: 1.3 10*3/uL — ABNORMAL LOW (ref 1.7–7.7)
Neutrophils Relative %: 33 %
Platelet Count: 201 10*3/uL (ref 150–400)
RBC: 4.63 MIL/uL (ref 3.87–5.11)
RDW: 15.2 % (ref 11.5–15.5)
WBC Count: 4 10*3/uL (ref 4.0–10.5)
nRBC: 0 % (ref 0.0–0.2)

## 2020-04-24 LAB — CMP (CANCER CENTER ONLY)
ALT: 23 U/L (ref 0–44)
AST: 19 U/L (ref 15–41)
Albumin: 3.6 g/dL (ref 3.5–5.0)
Alkaline Phosphatase: 112 U/L (ref 38–126)
Anion gap: 9 (ref 5–15)
BUN: 11 mg/dL (ref 6–20)
CO2: 24 mmol/L (ref 22–32)
Calcium: 8.9 mg/dL (ref 8.9–10.3)
Chloride: 105 mmol/L (ref 98–111)
Creatinine: 0.97 mg/dL (ref 0.44–1.00)
GFR, Estimated: >60 mL/min (ref >60)
Glucose, Bld: 118 mg/dL — ABNORMAL HIGH (ref 70–99)
Potassium: 3.5 mmol/L (ref 3.5–5.1)
Sodium: 138 mmol/L (ref 135–145)
Total Bilirubin: 0.4 mg/dL (ref 0.3–1.2)
Total Protein: 7.5 g/dL (ref 6.5–8.1)

## 2020-04-24 MED ORDER — SODIUM CHLORIDE 0.9 % IV SOLN
Freq: Once | INTRAVENOUS | Status: AC
  Filled 2020-04-24: qty 250

## 2020-04-24 MED ORDER — METHOTREXATE SODIUM (PF) CHEMO INJECTION 250 MG/10ML
40.0000 mg/m2 | Freq: Once | INTRAMUSCULAR | Status: AC
  Administered 2020-04-24: 89.25 mg via INTRAVENOUS
  Filled 2020-04-24: qty 3.57

## 2020-04-24 MED ORDER — FLUOROURACIL CHEMO INJECTION 2.5 GM/50ML
600.0000 mg/m2 | Freq: Once | INTRAVENOUS | Status: AC
  Administered 2020-04-24: 1350 mg via INTRAVENOUS
  Filled 2020-04-24: qty 27

## 2020-04-24 MED ORDER — PALONOSETRON HCL INJECTION 0.25 MG/5ML
0.2500 mg | Freq: Once | INTRAVENOUS | Status: AC
  Administered 2020-04-24: 0.25 mg via INTRAVENOUS

## 2020-04-24 MED ORDER — PALONOSETRON HCL INJECTION 0.25 MG/5ML
INTRAVENOUS | Status: AC
  Filled 2020-04-24: qty 5

## 2020-04-24 MED ORDER — SODIUM CHLORIDE 0.9 % IV SOLN
10.0000 mg | Freq: Once | INTRAVENOUS | Status: AC
  Administered 2020-04-24: 10 mg via INTRAVENOUS
  Filled 2020-04-24: qty 10

## 2020-04-24 MED ORDER — SODIUM CHLORIDE 0.9 % IV SOLN
600.0000 mg/m2 | Freq: Once | INTRAVENOUS | Status: AC
  Administered 2020-04-24: 1340 mg via INTRAVENOUS
  Filled 2020-04-24: qty 67

## 2020-04-24 NOTE — Progress Notes (Signed)
ANC 1.3 Dr Jana Hakim notified . Per Dr. Jana Hakim pt ok to treat.

## 2020-04-24 NOTE — Patient Instructions (Signed)
[facility] Discharge Instructions for Patients Receiving Chemotherapy  Today you received the following chemotherapy agents: Cytoxan, Methotrexate, 5FU  To help prevent nausea and vomiting after your treatment, we encourage you to take your nausea medication as directed.    If you develop nausea and vomiting that is not controlled by your nausea medication, call the clinic.   BELOW ARE SYMPTOMS THAT SHOULD BE REPORTED IMMEDIATELY:  *FEVER GREATER THAN 100.5 F  *CHILLS WITH OR WITHOUT FEVER  NAUSEA AND VOMITING THAT IS NOT CONTROLLED WITH YOUR NAUSEA MEDICATION  *UNUSUAL SHORTNESS OF BREATH  *UNUSUAL BRUISING OR BLEEDING  TENDERNESS IN MOUTH AND THROAT WITH OR WITHOUT PRESENCE OF ULCERS  *URINARY PROBLEMS  *BOWEL PROBLEMS  UNUSUAL RASH Items with * indicate a potential emergency and should be followed up as soon as possible.  Feel free to call the clinic should you have any questions or concerns. The clinic phone number is [phone].  Please show the CHEMO ALERT CARD at check-in to the Emergency Department and triage nurse.   

## 2020-04-24 NOTE — Progress Notes (Signed)
Ok to treat with ANC of 1.3 per DrMarland Kitchen Jana Hakim.

## 2020-05-15 ENCOUNTER — Inpatient Hospital Stay: Payer: 59

## 2020-05-15 ENCOUNTER — Encounter: Payer: Self-pay | Admitting: *Deleted

## 2020-05-15 ENCOUNTER — Other Ambulatory Visit: Payer: 59

## 2020-05-15 ENCOUNTER — Inpatient Hospital Stay (HOSPITAL_BASED_OUTPATIENT_CLINIC_OR_DEPARTMENT_OTHER): Payer: 59 | Admitting: Medical

## 2020-05-15 ENCOUNTER — Other Ambulatory Visit: Payer: Self-pay

## 2020-05-15 VITALS — BP 159/75 | HR 100 | Temp 98.9°F | Resp 18

## 2020-05-15 DIAGNOSIS — C50412 Malignant neoplasm of upper-outer quadrant of left female breast: Secondary | ICD-10-CM

## 2020-05-15 DIAGNOSIS — D701 Agranulocytosis secondary to cancer chemotherapy: Secondary | ICD-10-CM

## 2020-05-15 DIAGNOSIS — Z170 Estrogen receptor positive status [ER+]: Secondary | ICD-10-CM

## 2020-05-15 DIAGNOSIS — G620 Drug-induced polyneuropathy: Secondary | ICD-10-CM

## 2020-05-15 DIAGNOSIS — Z5111 Encounter for antineoplastic chemotherapy: Secondary | ICD-10-CM | POA: Diagnosis not present

## 2020-05-15 DIAGNOSIS — T451X5A Adverse effect of antineoplastic and immunosuppressive drugs, initial encounter: Secondary | ICD-10-CM

## 2020-05-15 LAB — CBC WITH DIFFERENTIAL (CANCER CENTER ONLY)
Abs Immature Granulocytes: 0.02 10*3/uL (ref 0.00–0.07)
Basophils Absolute: 0 10*3/uL (ref 0.0–0.1)
Basophils Relative: 1 %
Eosinophils Absolute: 0.1 10*3/uL (ref 0.0–0.5)
Eosinophils Relative: 3 %
HCT: 39.6 % (ref 36.0–46.0)
Hemoglobin: 12.5 g/dL (ref 12.0–15.0)
Immature Granulocytes: 1 %
Lymphocytes Relative: 47 %
Lymphs Abs: 1.4 10*3/uL (ref 0.7–4.0)
MCH: 28.5 pg (ref 26.0–34.0)
MCHC: 31.6 g/dL (ref 30.0–36.0)
MCV: 90.2 fL (ref 80.0–100.0)
Monocytes Absolute: 0.5 10*3/uL (ref 0.1–1.0)
Monocytes Relative: 17 %
Neutro Abs: 0.9 10*3/uL — ABNORMAL LOW (ref 1.7–7.7)
Neutrophils Relative %: 31 %
Platelet Count: 234 10*3/uL (ref 150–400)
RBC: 4.39 MIL/uL (ref 3.87–5.11)
RDW: 16.4 % — ABNORMAL HIGH (ref 11.5–15.5)
WBC Count: 3 10*3/uL — ABNORMAL LOW (ref 4.0–10.5)
nRBC: 0 % (ref 0.0–0.2)

## 2020-05-15 LAB — CMP (CANCER CENTER ONLY)
ALT: 49 U/L — ABNORMAL HIGH (ref 0–44)
AST: 28 U/L (ref 15–41)
Albumin: 3.3 g/dL — ABNORMAL LOW (ref 3.5–5.0)
Alkaline Phosphatase: 118 U/L (ref 38–126)
Anion gap: 11 (ref 5–15)
BUN: 13 mg/dL (ref 6–20)
CO2: 25 mmol/L (ref 22–32)
Calcium: 8.7 mg/dL — ABNORMAL LOW (ref 8.9–10.3)
Chloride: 105 mmol/L (ref 98–111)
Creatinine: 0.83 mg/dL (ref 0.44–1.00)
GFR, Estimated: >60 mL/min (ref >60)
Glucose, Bld: 184 mg/dL — ABNORMAL HIGH (ref 70–99)
Potassium: 3.4 mmol/L — ABNORMAL LOW (ref 3.5–5.1)
Sodium: 141 mmol/L (ref 135–145)
Total Bilirubin: 0.3 mg/dL (ref 0.3–1.2)
Total Protein: 7 g/dL (ref 6.5–8.1)

## 2020-05-15 MED ORDER — SODIUM CHLORIDE 0.9 % IV SOLN
10.0000 mg | Freq: Once | INTRAVENOUS | Status: AC
  Administered 2020-05-15: 10 mg via INTRAVENOUS
  Filled 2020-05-15: qty 10

## 2020-05-15 MED ORDER — SODIUM CHLORIDE 0.9 % IV SOLN
Freq: Once | INTRAVENOUS | Status: AC
  Filled 2020-05-15: qty 250

## 2020-05-15 MED ORDER — PALONOSETRON HCL INJECTION 0.25 MG/5ML
INTRAVENOUS | Status: AC
  Filled 2020-05-15: qty 5

## 2020-05-15 MED ORDER — FLUOROURACIL CHEMO INJECTION 2.5 GM/50ML
600.0000 mg/m2 | Freq: Once | INTRAVENOUS | Status: AC
  Administered 2020-05-15: 1350 mg via INTRAVENOUS
  Filled 2020-05-15: qty 27

## 2020-05-15 MED ORDER — PALONOSETRON HCL INJECTION 0.25 MG/5ML
0.2500 mg | Freq: Once | INTRAVENOUS | Status: AC
  Administered 2020-05-15: 0.25 mg via INTRAVENOUS

## 2020-05-15 MED ORDER — GABAPENTIN (ONCE-DAILY) 300 MG PO TABS: 300.0000 mg | ORAL_TABLET | Freq: Every evening | ORAL | 5 refills | Status: DC

## 2020-05-15 MED ORDER — SODIUM CHLORIDE 0.9 % IV SOLN
600.0000 mg/m2 | Freq: Once | INTRAVENOUS | Status: AC
  Administered 2020-05-15: 1340 mg via INTRAVENOUS
  Filled 2020-05-15: qty 67

## 2020-05-15 MED ORDER — METHOTREXATE SODIUM (PF) CHEMO INJECTION 250 MG/10ML
40.0000 mg/m2 | Freq: Once | INTRAMUSCULAR | Status: AC
  Administered 2020-05-15: 89.25 mg via INTRAVENOUS
  Filled 2020-05-15: qty 3.57

## 2020-05-15 NOTE — Patient Instructions (Signed)
[facility] Discharge Instructions for Patients Receiving Chemotherapy  Today you received the following chemotherapy agents: Cytoxan, Methotrexate, 5FU  To help prevent nausea and vomiting after your treatment, we encourage you to take your nausea medication as directed.    If you develop nausea and vomiting that is not controlled by your nausea medication, call the clinic.   BELOW ARE SYMPTOMS THAT SHOULD BE REPORTED IMMEDIATELY:  *FEVER GREATER THAN 100.5 F  *CHILLS WITH OR WITHOUT FEVER  NAUSEA AND VOMITING THAT IS NOT CONTROLLED WITH YOUR NAUSEA MEDICATION  *UNUSUAL SHORTNESS OF BREATH  *UNUSUAL BRUISING OR BLEEDING  TENDERNESS IN MOUTH AND THROAT WITH OR WITHOUT PRESENCE OF ULCERS  *URINARY PROBLEMS  *BOWEL PROBLEMS  UNUSUAL RASH Items with * indicate a potential emergency and should be followed up as soon as possible.  Feel free to call the clinic should you have any questions or concerns. The clinic phone number is [phone].  Please show the [place] at check-in to the Emergency Department and triage nurse.
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
## 2020-05-16 ENCOUNTER — Telehealth: Payer: Self-pay | Admitting: Medical

## 2020-05-16 ENCOUNTER — Telehealth: Payer: Self-pay

## 2020-05-16 NOTE — Addendum Note (Signed)
Addended by: Tora Kindred on: 05/16/2020 02:50 PM   Modules accepted: Orders

## 2020-05-16 NOTE — Telephone Encounter (Signed)
Scheduled per 03/29 los, patient has been called and voicemail was left.

## 2020-05-16 NOTE — Telephone Encounter (Signed)
Returned call from patient regarding injection appointments for 3/31, 4/1, and 4/2. Patient given list of scheduled appointment times. Verbalized understanding and appreciation.

## 2020-05-17 ENCOUNTER — Inpatient Hospital Stay: Payer: 59

## 2020-05-17 ENCOUNTER — Other Ambulatory Visit: Payer: Self-pay

## 2020-05-17 VITALS — BP 142/62 | HR 93

## 2020-05-17 DIAGNOSIS — C50412 Malignant neoplasm of upper-outer quadrant of left female breast: Secondary | ICD-10-CM

## 2020-05-17 DIAGNOSIS — Z5111 Encounter for antineoplastic chemotherapy: Secondary | ICD-10-CM | POA: Diagnosis not present

## 2020-05-17 DIAGNOSIS — Z170 Estrogen receptor positive status [ER+]: Secondary | ICD-10-CM

## 2020-05-17 MED ORDER — FILGRASTIM-SNDZ 480 MCG/0.8ML IJ SOSY
480.0000 ug | PREFILLED_SYRINGE | Freq: Once | INTRAMUSCULAR | Status: AC
  Administered 2020-05-17: 480 ug via SUBCUTANEOUS

## 2020-05-17 MED ORDER — FILGRASTIM-SNDZ 480 MCG/0.8ML IJ SOSY
PREFILLED_SYRINGE | INTRAMUSCULAR | Status: AC
  Filled 2020-05-17: qty 0.8

## 2020-05-18 ENCOUNTER — Inpatient Hospital Stay: Payer: 59 | Attending: Oncology

## 2020-05-18 VITALS — BP 166/83 | HR 95 | Temp 98.9°F | Resp 18

## 2020-05-18 DIAGNOSIS — Z5111 Encounter for antineoplastic chemotherapy: Secondary | ICD-10-CM | POA: Insufficient documentation

## 2020-05-18 DIAGNOSIS — D701 Agranulocytosis secondary to cancer chemotherapy: Secondary | ICD-10-CM | POA: Insufficient documentation

## 2020-05-18 DIAGNOSIS — C50412 Malignant neoplasm of upper-outer quadrant of left female breast: Secondary | ICD-10-CM

## 2020-05-18 DIAGNOSIS — Z170 Estrogen receptor positive status [ER+]: Secondary | ICD-10-CM | POA: Insufficient documentation

## 2020-05-18 DIAGNOSIS — T451X5A Adverse effect of antineoplastic and immunosuppressive drugs, initial encounter: Secondary | ICD-10-CM | POA: Diagnosis not present

## 2020-05-18 MED ORDER — FILGRASTIM-SNDZ 480 MCG/0.8ML IJ SOSY
480.0000 ug | PREFILLED_SYRINGE | Freq: Once | INTRAMUSCULAR | Status: AC
  Administered 2020-05-18: 480 ug via SUBCUTANEOUS

## 2020-05-18 MED ORDER — FILGRASTIM-SNDZ 480 MCG/0.8ML IJ SOSY
PREFILLED_SYRINGE | INTRAMUSCULAR | Status: AC
  Filled 2020-05-18: qty 0.8

## 2020-05-19 ENCOUNTER — Other Ambulatory Visit: Payer: Self-pay

## 2020-05-19 ENCOUNTER — Inpatient Hospital Stay: Payer: 59

## 2020-05-19 VITALS — BP 176/73 | HR 105 | Temp 99.4°F | Resp 19

## 2020-05-19 DIAGNOSIS — C50412 Malignant neoplasm of upper-outer quadrant of left female breast: Secondary | ICD-10-CM

## 2020-05-19 DIAGNOSIS — Z5111 Encounter for antineoplastic chemotherapy: Secondary | ICD-10-CM | POA: Diagnosis not present

## 2020-05-19 DIAGNOSIS — Z170 Estrogen receptor positive status [ER+]: Secondary | ICD-10-CM

## 2020-05-19 MED ORDER — FILGRASTIM-SNDZ 480 MCG/0.8ML IJ SOSY
PREFILLED_SYRINGE | INTRAMUSCULAR | Status: AC
  Filled 2020-05-19: qty 0.8

## 2020-05-19 MED ORDER — FILGRASTIM-SNDZ 480 MCG/0.8ML IJ SOSY
480.0000 ug | PREFILLED_SYRINGE | Freq: Once | INTRAMUSCULAR | Status: AC
  Administered 2020-05-19: 480 ug via SUBCUTANEOUS

## 2020-05-19 MED ORDER — PEGFILGRASTIM-BMEZ 6 MG/0.6ML ~~LOC~~ SOSY
PREFILLED_SYRINGE | SUBCUTANEOUS | Status: AC
  Filled 2020-05-19: qty 0.6

## 2020-05-23 ENCOUNTER — Telehealth: Payer: Self-pay

## 2020-05-23 ENCOUNTER — Encounter: Payer: Self-pay | Admitting: Oncology

## 2020-05-23 NOTE — Progress Notes (Signed)
Patient called to inquire about assistance regarding supplies.  Reviewed notes and advised I reached out back in October introducing myself and offering the one-time $1000 [employer] grant to assist with personal expenses while going through treatment and she states she did not get back to me. Advised she may reach out to me, once dates scheduled after 19th due to availability. She wasn't sure if that would be her last one or not.  Advised as far as supplies, there is a separate grant which can help with the cost and to have [employer] provide PT referral form and invoice and hospital will mail check directly to company. She verbalized understanding.  She has my name and number for any additional financial questions or concerns.

## 2020-05-23 NOTE — Telephone Encounter (Signed)
Err

## 2020-05-24 ENCOUNTER — Encounter: Payer: Self-pay | Admitting: Oncology

## 2020-05-24 ENCOUNTER — Other Ambulatory Visit: Payer: Self-pay

## 2020-05-24 ENCOUNTER — Encounter: Payer: Self-pay | Admitting: Neurology

## 2020-05-24 ENCOUNTER — Ambulatory Visit (INDEPENDENT_AMBULATORY_CARE_PROVIDER_SITE_OTHER): Payer: 59 | Admitting: Neurology

## 2020-05-24 VITALS — BP 150/89 | HR 92 | Ht 64.0 in | Wt 254.0 lb

## 2020-05-24 DIAGNOSIS — R519 Headache, unspecified: Secondary | ICD-10-CM

## 2020-05-24 DIAGNOSIS — R351 Nocturia: Secondary | ICD-10-CM

## 2020-05-24 DIAGNOSIS — R0681 Apnea, not elsewhere classified: Secondary | ICD-10-CM | POA: Diagnosis not present

## 2020-05-24 DIAGNOSIS — G4719 Other hypersomnia: Secondary | ICD-10-CM | POA: Diagnosis not present

## 2020-05-24 DIAGNOSIS — R0683 Snoring: Secondary | ICD-10-CM | POA: Diagnosis not present

## 2020-05-24 NOTE — Progress Notes (Signed)
[employer] will retro back to earliest claim date of 01/25/20 for Zarxio copay.[name]

## 2020-05-24 NOTE — Progress Notes (Signed)
Reviewed patient's account for possible assistance and found eligible copay assistance for Zarxio.  Enrolled patient in [employer] copay program. Patient approved for $10,000 leaving her with a $0 copay after insurance pays for each dose.  Patient will receive a copy of the approval letter in the mail.  A copy will be given to [facility] for billing/claim submissions.   Called patient to advise of approval and savings. She was very Patent attorney.  She has my card for any additional financial questions or concerns.
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
## 2020-05-24 NOTE — Patient Instructions (Signed)

## 2020-05-25 DIAGNOSIS — R200 Anesthesia of skin: Secondary | ICD-10-CM | POA: Insufficient documentation

## 2020-06-04 ENCOUNTER — Other Ambulatory Visit: Payer: Self-pay

## 2020-06-04 DIAGNOSIS — C50412 Malignant neoplasm of upper-outer quadrant of left female breast: Secondary | ICD-10-CM

## 2020-06-04 DIAGNOSIS — Z170 Estrogen receptor positive status [ER+]: Secondary | ICD-10-CM
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
## 2020-06-05 ENCOUNTER — Inpatient Hospital Stay: Payer: 59

## 2020-06-05 ENCOUNTER — Encounter: Payer: Self-pay | Admitting: *Deleted

## 2020-06-05 ENCOUNTER — Telehealth: Payer: Self-pay | Admitting: Oncology

## 2020-06-05 ENCOUNTER — Other Ambulatory Visit: Payer: Self-pay

## 2020-06-05 ENCOUNTER — Other Ambulatory Visit: Payer: 59

## 2020-06-05 ENCOUNTER — Inpatient Hospital Stay (HOSPITAL_BASED_OUTPATIENT_CLINIC_OR_DEPARTMENT_OTHER): Payer: 59 | Admitting: Oncology

## 2020-06-05 VITALS — BP 152/98 | HR 98 | Temp 97.5°F | Resp 18 | Ht 64.0 in | Wt 254.1 lb

## 2020-06-05 DIAGNOSIS — C50412 Malignant neoplasm of upper-outer quadrant of left female breast: Secondary | ICD-10-CM

## 2020-06-05 DIAGNOSIS — Z170 Estrogen receptor positive status [ER+]: Secondary | ICD-10-CM

## 2020-06-05 DIAGNOSIS — Z5111 Encounter for antineoplastic chemotherapy: Secondary | ICD-10-CM | POA: Diagnosis not present

## 2020-06-05 LAB — CBC WITH DIFFERENTIAL (CANCER CENTER ONLY)
Abs Immature Granulocytes: 0.01 10*3/uL (ref 0.00–0.07)
Basophils Absolute: 0 10*3/uL (ref 0.0–0.1)
Basophils Relative: 1 %
Eosinophils Absolute: 0.1 10*3/uL (ref 0.0–0.5)
Eosinophils Relative: 3 %
HCT: 39.8 % (ref 36.0–46.0)
Hemoglobin: 13 g/dL (ref 12.0–15.0)
Immature Granulocytes: 0 %
Lymphocytes Relative: 49 %
Lymphs Abs: 1.6 10*3/uL (ref 0.7–4.0)
MCH: 29.3 pg (ref 26.0–34.0)
MCHC: 32.7 g/dL (ref 30.0–36.0)
MCV: 89.8 fL (ref 80.0–100.0)
Monocytes Absolute: 0.5 10*3/uL (ref 0.1–1.0)
Monocytes Relative: 14 %
Neutro Abs: 1.1 10*3/uL — ABNORMAL LOW (ref 1.7–7.7)
Neutrophils Relative %: 33 %
Platelet Count: 284 10*3/uL (ref 150–400)
RBC: 4.43 MIL/uL (ref 3.87–5.11)
RDW: 16.1 % — ABNORMAL HIGH (ref 11.5–15.5)
WBC Count: 3.3 10*3/uL — ABNORMAL LOW (ref 4.0–10.5)
nRBC: 0 % (ref 0.0–0.2)

## 2020-06-05 LAB — CMP (CANCER CENTER ONLY)
ALT: 29 U/L (ref 0–44)
AST: 22 U/L (ref 15–41)
Albumin: 3.6 g/dL (ref 3.5–5.0)
Alkaline Phosphatase: 123 U/L (ref 38–126)
Anion gap: 12 (ref 5–15)
BUN: 10 mg/dL (ref 6–20)
CO2: 24 mmol/L (ref 22–32)
Calcium: 9.3 mg/dL (ref 8.9–10.3)
Chloride: 106 mmol/L (ref 98–111)
Creatinine: 0.97 mg/dL (ref 0.44–1.00)
GFR, Estimated: >60 mL/min (ref >60)
Glucose, Bld: 126 mg/dL — ABNORMAL HIGH (ref 70–99)
Potassium: 3.7 mmol/L (ref 3.5–5.1)
Sodium: 142 mmol/L (ref 135–145)
Total Bilirubin: 0.4 mg/dL (ref 0.3–1.2)
Total Protein: 7.4 g/dL (ref 6.5–8.1)

## 2020-06-05 MED ORDER — SODIUM CHLORIDE 0.9 % IV SOLN
600.0000 mg/m2 | Freq: Once | INTRAVENOUS | Status: AC
  Administered 2020-06-05: 1340 mg via INTRAVENOUS
  Filled 2020-06-05: qty 67

## 2020-06-05 MED ORDER — FLUOROURACIL CHEMO INJECTION 2.5 GM/50ML
600.0000 mg/m2 | Freq: Once | INTRAVENOUS | Status: AC
  Administered 2020-06-05: 1350 mg via INTRAVENOUS
  Filled 2020-06-05: qty 27

## 2020-06-05 MED ORDER — PALONOSETRON HCL INJECTION 0.25 MG/5ML
INTRAVENOUS | Status: AC
  Filled 2020-06-05: qty 5

## 2020-06-05 MED ORDER — METHOTREXATE SODIUM (PF) CHEMO INJECTION 250 MG/10ML
40.3220 mg/m2 | Freq: Once | INTRAMUSCULAR | Status: AC
  Administered 2020-06-05: 90 mg via INTRAVENOUS
  Filled 2020-06-05: qty 3.6

## 2020-06-05 MED ORDER — SODIUM CHLORIDE 0.9 % IV SOLN
10.0000 mg | Freq: Once | INTRAVENOUS | Status: AC
  Administered 2020-06-05: 10 mg via INTRAVENOUS
  Filled 2020-06-05: qty 10

## 2020-06-05 MED ORDER — SODIUM CHLORIDE 0.9 % IV SOLN
Freq: Once | INTRAVENOUS | Status: AC
  Filled 2020-06-05: qty 250

## 2020-06-05 MED ORDER — PALONOSETRON HCL INJECTION 0.25 MG/5ML
0.2500 mg | Freq: Once | INTRAVENOUS | Status: AC
  Administered 2020-06-05: 0.25 mg via INTRAVENOUS

## 2020-06-05 NOTE — Telephone Encounter (Signed)
Scheduled appts per 4/19 sch msg. Updated calendar printed for pt in infusion today per RN Heidi.

## 2020-06-05 NOTE — Patient Instructions (Signed)
[facility] Discharge Instructions for Patients Receiving Chemotherapy  Today you received the following chemotherapy agents: Cytoxan, Methotrexate, 5FU  To help prevent nausea and vomiting after your treatment, we encourage you to take your nausea medication as directed.    If you develop nausea and vomiting that is not controlled by your nausea medication, call the clinic.   BELOW ARE SYMPTOMS THAT SHOULD BE REPORTED IMMEDIATELY:  *FEVER GREATER THAN 100.5 F  *CHILLS WITH OR WITHOUT FEVER  NAUSEA AND VOMITING THAT IS NOT CONTROLLED WITH YOUR NAUSEA MEDICATION  *UNUSUAL SHORTNESS OF BREATH  *UNUSUAL BRUISING OR BLEEDING  TENDERNESS IN MOUTH AND THROAT WITH OR WITHOUT PRESENCE OF ULCERS  *URINARY PROBLEMS  *BOWEL PROBLEMS  UNUSUAL RASH Items with * indicate a potential emergency and should be followed up as soon as possible.  Feel free to call the clinic should you have any questions or concerns. The clinic phone number is [phone].  Please show the [place] at check-in to the Emergency Department and triage nurse.

## 2020-06-05 NOTE — Progress Notes (Unsigned)
Okay to proceed with CMF with ANC of 1.1 per Dr. Jana Hakim

## 2020-06-06 ENCOUNTER — Telehealth: Payer: Self-pay | Admitting: Oncology

## 2020-06-06 NOTE — Telephone Encounter (Signed)
Scheduled appts per 4/19 sch msg. Pt aware.  

## 2020-06-07 ENCOUNTER — Other Ambulatory Visit: Payer: Self-pay

## 2020-06-07 ENCOUNTER — Inpatient Hospital Stay: Payer: 59

## 2020-06-07 ENCOUNTER — Telehealth: Payer: Self-pay | Admitting: Oncology

## 2020-06-07 DIAGNOSIS — C50412 Malignant neoplasm of upper-outer quadrant of left female breast: Secondary | ICD-10-CM

## 2020-06-07 DIAGNOSIS — Z170 Estrogen receptor positive status [ER+]: Secondary | ICD-10-CM

## 2020-06-07 DIAGNOSIS — Z5111 Encounter for antineoplastic chemotherapy: Secondary | ICD-10-CM | POA: Diagnosis not present

## 2020-06-07 MED ORDER — FILGRASTIM-SNDZ 480 MCG/0.8ML IJ SOSY
480.0000 ug | PREFILLED_SYRINGE | Freq: Once | INTRAMUSCULAR | Status: AC
  Administered 2020-06-07: 480 ug via SUBCUTANEOUS

## 2020-06-07 MED ORDER — FILGRASTIM-SNDZ 480 MCG/0.8ML IJ SOSY
PREFILLED_SYRINGE | INTRAMUSCULAR | Status: AC
  Filled 2020-06-07: qty 0.8

## 2020-06-07 NOTE — Telephone Encounter (Signed)
Scheduled per 4/19 los. Pt will receive an updated appt calendar per next visit

## 2020-06-08 ENCOUNTER — Inpatient Hospital Stay: Payer: 59

## 2020-06-08 ENCOUNTER — Encounter: Payer: Self-pay | Admitting: Oncology

## 2020-06-08 VITALS — BP 150/67 | HR 97 | Temp 99.0°F | Resp 18

## 2020-06-08 DIAGNOSIS — C50412 Malignant neoplasm of upper-outer quadrant of left female breast: Secondary | ICD-10-CM

## 2020-06-08 DIAGNOSIS — Z5111 Encounter for antineoplastic chemotherapy: Secondary | ICD-10-CM | POA: Diagnosis not present

## 2020-06-08 MED ORDER — FILGRASTIM-SNDZ 480 MCG/0.8ML IJ SOSY
480.0000 ug | PREFILLED_SYRINGE | Freq: Once | INTRAMUSCULAR | Status: AC
  Administered 2020-06-08: 480 ug via SUBCUTANEOUS

## 2020-06-08 MED ORDER — FILGRASTIM-SNDZ 480 MCG/0.8ML IJ SOSY
PREFILLED_SYRINGE | INTRAMUSCULAR | Status: AC
  Filled 2020-06-08: qty 0.8

## 2020-06-08 NOTE — Progress Notes (Signed)
Returned call to patient whom left voicemail regarding applying for [employer].  Advised what is needed to apply. She will bring today and meet with me after injection.  She has my card for any additional financial questions or concerns.

## 2020-06-08 NOTE — Progress Notes (Signed)
Met with patient at registration to complete grant process.  Patient approved for one-time $1000 [employer] grant to assist with personal expenses while going through treatment. Discussed in detail expenses and how they are covered. Gave her a copy of the approval letter and expense sheet along with the Outpatient pharmacy information. She received a gift card today from her grant.  She has my card for any additional financial questions or concerns.

## 2020-06-08 NOTE — Patient Instructions (Signed)

## 2020-06-09 ENCOUNTER — Inpatient Hospital Stay: Payer: 59

## 2020-06-09 ENCOUNTER — Other Ambulatory Visit: Payer: Self-pay

## 2020-06-09 VITALS — BP 173/74 | HR 107 | Temp 98.7°F | Resp 20

## 2020-06-09 DIAGNOSIS — Z5111 Encounter for antineoplastic chemotherapy: Secondary | ICD-10-CM | POA: Diagnosis not present

## 2020-06-09 MED ORDER — FILGRASTIM-SNDZ 480 MCG/0.8ML IJ SOSY
480.0000 ug | PREFILLED_SYRINGE | Freq: Once | INTRAMUSCULAR | Status: AC
  Administered 2020-06-09: 480 ug via SUBCUTANEOUS

## 2020-06-09 NOTE — Patient Instructions (Signed)

## 2020-06-11 ENCOUNTER — Encounter: Payer: Self-pay | Admitting: *Deleted

## 2020-06-18 ENCOUNTER — Ambulatory Visit (INDEPENDENT_AMBULATORY_CARE_PROVIDER_SITE_OTHER): Payer: 59 | Admitting: Neurology

## 2020-06-18 DIAGNOSIS — G4719 Other hypersomnia: Secondary | ICD-10-CM

## 2020-06-18 DIAGNOSIS — R351 Nocturia: Secondary | ICD-10-CM

## 2020-06-18 DIAGNOSIS — R0681 Apnea, not elsewhere classified: Secondary | ICD-10-CM

## 2020-06-18 DIAGNOSIS — G4733 Obstructive sleep apnea (adult) (pediatric): Secondary | ICD-10-CM

## 2020-06-18 DIAGNOSIS — R519 Headache, unspecified: Secondary | ICD-10-CM

## 2020-06-18 DIAGNOSIS — R0683 Snoring: Secondary | ICD-10-CM

## 2020-06-19 ENCOUNTER — Other Ambulatory Visit: Payer: Self-pay

## 2020-06-19 ENCOUNTER — Encounter: Payer: Self-pay | Admitting: Oncology

## 2020-06-19 DIAGNOSIS — Z170 Estrogen receptor positive status [ER+]: Secondary | ICD-10-CM

## 2020-06-19 DIAGNOSIS — C50412 Malignant neoplasm of upper-outer quadrant of left female breast: Secondary | ICD-10-CM

## 2020-06-19 NOTE — Progress Notes (Signed)
See procedure note.

## 2020-06-19 NOTE — Progress Notes (Signed)
Pt requesting new referral for physical therapy.  Referral placed.

## 2020-06-25 ENCOUNTER — Other Ambulatory Visit: Payer: Self-pay | Admitting: *Deleted

## 2020-06-25 ENCOUNTER — Telehealth: Payer: Self-pay

## 2020-06-25 DIAGNOSIS — C50412 Malignant neoplasm of upper-outer quadrant of left female breast: Secondary | ICD-10-CM

## 2020-06-25 DIAGNOSIS — Z170 Estrogen receptor positive status [ER+]: Secondary | ICD-10-CM

## 2020-06-25 NOTE — Telephone Encounter (Signed)
-----   Message from Star Age, MD sent at 06/25/2020  8:20 AM EDT ----- Patient referred by Dr. Isaac Bliss, seen by me on 05/24/20, patient had a HST on 06/18/20.    Please call and notify the patient that the recent home sleep test showed obstructive sleep apnea in the severe range. I recommend treatment in the form of autoPAP, which means, that we don't have to bring her in for a sleep study with CPAP, but will let her start using a so called autoPAP machine at home, which is a CPAP-like machine with self-adjusting pressures. We will send the order to a local DME company (of her choice, or as per insurance requirement). The DME representative will fit her with a mask, educate her on how to use the machine, how to put the mask on, etc. I have placed an order in the chart. Please send the order, talk to patient, send report to referring MD. We will need a FU in sleep clinic for 10 weeks post-PAP set up, please arrange that with me or one of our NPs. Also reinforce the need for compliance with treatment. Thanks,   Star Age, MD, PhD [facility])

## 2020-06-25 NOTE — Telephone Encounter (Signed)
I called pt. I advised pt that Dr. Rexene Alberts reviewed their sleep study results and found that pt has severe osa. Dr. Rexene Alberts recommends that pt start auto-pap for treatment. I reviewed PAP compliance expectations with the pt. Pt is agreeable to starting an auto-PAP. I advised pt that an order will be sent to a DME, [employer], and [employer] will call the pt within about one week after they file with the pt's insurance. [employer] will show the pt how to use the machine, fit for masks, and troubleshoot the auto-PAP if needed. Once pt is started on the autopap we will see the pt back within 31-90 days. Pt verbalized understanding to arrive 15 minutes early and bring their auto-PAP. A letter with all of this information in it will be mailed to the pt as a reminder. I verified with the pt that the address we have on file is correct. Pt verbalized understanding of results. Pt had no questions at this time but was encouraged to call back if questions arise. I have sent the order to [employer] and have received confirmation that they have received the order.

## 2020-06-25 NOTE — Progress Notes (Signed)
Patient referred by Dr. Isaac Bliss, seen by me on 05/24/20, patient had a HST on 06/18/20.    Please call and notify the patient that the recent home sleep test showed obstructive sleep apnea in the severe range. I recommend treatment in the form of autoPAP, which means, that we don't have to bring her in for a sleep study with CPAP, but will let her start using a so called autoPAP machine at home, which is a CPAP-like machine with self-adjusting pressures. We will send the order to a local DME company (of her choice, or as per insurance requirement). The DME representative will fit her with a mask, educate her on how to use the machine, how to put the mask on, etc. I have placed an order in the chart. Please send the order, talk to patient, send report to referring MD. We will need a FU in sleep clinic for 10 weeks post-PAP set up, please arrange that with me or one of our NPs. Also reinforce the need for compliance with treatment. Thanks,   Star Age, MD, PhD [facility])

## 2020-06-25 NOTE — Procedures (Signed)
[facility] ([facility])  STUDY DATE: 06/18/20  DOB: Oct 28, 1960  MRN: [id number]  ORDERING CLINICIAN: Star Age, MD, PhD   REFERRING CLINICIAN: Isaac Bliss, Rayford Halsted, MD   CLINICAL INFORMATION/HISTORY: 60 year old woman with a history of breast cancer, status post left mastectomy, status post radiation therapy, currently on chemotherapy, status post right reduction mammoplasty, hypertension, migraine headaches, arthritis, and severe obesity with a BMI of over 40, who reports snoring and excessive daytime somnolence as well as witnessed apneas per family.   Epworth sleepiness score: 19/24.  BMI: 43.6 kg/m  Neck Circumference: 16-1/2"  FINDINGS:   Total Record Time (hours, min): 9 H 14 min  Total Sleep Time (hours, min):  7 H 2 min   Percent REM (%):    0   Calculated pAHI (per hour): 73.5     REM pAHI: N/A    NREM pAHI: N/A Supine AHI: 103.5   Oxygen Saturation (%) Mean: 92  Minimum oxygen saturation (%):        59   O2 Saturation Range (%): 59-99  O2Saturation (minutes) <=88%: 30.5 min  Pulse Mean (bpm):    90  Pulse Range (41-144)   IMPRESSION: OSA (obstructive sleep apnea), severe   RECOMMENDATION:  This home sleep test demonstrates severe obstructive sleep apnea with a total AHI of 73.5/hour and O2 nadir of 59%. REM sleep was not detected. Snoring was noted and ranged in the moderate to severe range. Treatment with positive airway pressure is recommended. The patient will be advised to proceed with an autoPAP titration/trial at home for now. A full night titration study may be considered to optimize treatment settings, if needed down the road. Please note that untreated obstructive sleep apnea may carry additional perioperative morbidity. Patients with significant obstructive sleep apnea should receive perioperative PAP therapy and the surgeons and particularly the anesthesiologist should be informed of the diagnosis and the severity of  the sleep disordered breathing. The patient should be cautioned not to drive, work at heights, or operate dangerous or heavy equipment when tired or sleepy. Review and reiteration of good sleep hygiene measures should be pursued with any patient. Other causes of the patient's symptoms, including circadian rhythm disturbances, an underlying mood disorder, medication effect and/or an underlying medical problem cannot be ruled out based on this test. Clinical correlation is recommended. The patient and her referring provider will be notified of the test results. The patient will be seen in follow up in sleep clinic at [facility].  I certify that I have reviewed the raw data recording prior to the issuance of this report in accordance with the standards of the American Academy of Sleep Medicine (AASM).  INTERPRETING PHYSICIAN:  Star Age, MD, PhD  Board Certified in Neurology and Sleep Medicine  [facility] [address], [place], [place] [address] [phone]  Sleep Summary  Oxygen Saturation Statistics   Start Study Time: End Study Time: Total Recording Time:          11:58:18 PM 9:12:42 AM  9 h, 14 min  Total Sleep Time % REM of Sleep Time:  7 h, 2 min  0.0    Mean: 92 Minimum: 59 Maximum: 99  Mean of Desaturations Nadirs (%):   84  Oxygen Desatur. %:  4-9 10-20 >20 Total  Events Number Total   70  314 16.3 73.0  46 10.7  430 100.0  Oxygen Saturation: <90 <=88 <85 <80 <70  Duration (  minutes): Sleep % 89.2 21.1 75.4 30.5 17.9 7.2 10.5 2.5 3.0 0.7     Respiratory Indices      Total Events REM NREM All Night  pRDI: pAHI 3%: ODI 4%: pAHIc 3%: % CSR: pAHI 4%:  476  476 430   63 0.0 437 N/A N/A N/A N/A N/A N/A N/A N/A 73.5 73.5 66.4 9.7 0.0 67.5       Pulse Rate Statistics during Sleep (BPM)      Mean: 90 Minimum: 41 Maximum: 144        Body Position Statistics  Position Supine Prone Right Left Non-Supine  Sleep (min) 127.0  22.5 1.0 271.9 295.4  Sleep % 30.1 5.3 0.2 64.4 69.9  pRDI 103.5 87.3 N/A 58.6 60.8  pAHI 3% 103.5 87.3 N/A 58.6 60.8  ODI 4% 97.2 61.9 N/A 52.6 53.4     Snoring Statistics Snoring Level (dB) >40 >50 >60 >70 >80 >Threshold (45)  Sleep (min) 166.4 50.4 8.6 0.0 0.0 94.0  Sleep % 39.4 11.9 2.0 0.0 0.0 22.3    Mean: 43 dB

## 2020-06-25 NOTE — Addendum Note (Signed)
Addended by: Star Age on: 06/25/2020 08:20 AM   Modules accepted: Orders

## 2020-06-27 ENCOUNTER — Inpatient Hospital Stay: Payer: 59

## 2020-06-27 ENCOUNTER — Other Ambulatory Visit: Payer: Self-pay

## 2020-06-27 ENCOUNTER — Inpatient Hospital Stay: Payer: 59 | Attending: Oncology

## 2020-06-27 ENCOUNTER — Telehealth: Payer: Self-pay | Admitting: *Deleted

## 2020-06-27 ENCOUNTER — Encounter: Payer: Self-pay | Admitting: *Deleted

## 2020-06-27 VITALS — BP 156/77 | HR 94 | Temp 99.1°F | Resp 20

## 2020-06-27 DIAGNOSIS — Z9221 Personal history of antineoplastic chemotherapy: Secondary | ICD-10-CM

## 2020-06-27 DIAGNOSIS — Z170 Estrogen receptor positive status [ER+]: Secondary | ICD-10-CM | POA: Insufficient documentation

## 2020-06-27 DIAGNOSIS — C773 Secondary and unspecified malignant neoplasm of axilla and upper limb lymph nodes: Secondary | ICD-10-CM | POA: Diagnosis not present

## 2020-06-27 DIAGNOSIS — Z5111 Encounter for antineoplastic chemotherapy: Secondary | ICD-10-CM | POA: Insufficient documentation

## 2020-06-27 DIAGNOSIS — Z5189 Encounter for other specified aftercare: Secondary | ICD-10-CM | POA: Diagnosis not present

## 2020-06-27 DIAGNOSIS — C50412 Malignant neoplasm of upper-outer quadrant of left female breast: Secondary | ICD-10-CM

## 2020-06-27 HISTORY — DX: Personal history of antineoplastic chemotherapy: Z92.21

## 2020-06-27 LAB — CMP (CANCER CENTER ONLY)
ALT: 42 U/L (ref 0–44)
AST: 26 U/L (ref 15–41)
Albumin: 3.8 g/dL (ref 3.5–5.0)
Alkaline Phosphatase: 124 U/L (ref 38–126)
Anion gap: 10 (ref 5–15)
BUN: 15 mg/dL (ref 6–20)
CO2: 25 mmol/L (ref 22–32)
Calcium: 9.3 mg/dL (ref 8.9–10.3)
Chloride: 107 mmol/L (ref 98–111)
Creatinine: 0.96 mg/dL (ref 0.44–1.00)
GFR, Estimated: >60 mL/min (ref >60)
Glucose, Bld: 145 mg/dL — ABNORMAL HIGH (ref 70–99)
Potassium: 3.4 mmol/L — ABNORMAL LOW (ref 3.5–5.1)
Sodium: 142 mmol/L (ref 135–145)
Total Bilirubin: 0.4 mg/dL (ref 0.3–1.2)
Total Protein: 7.6 g/dL (ref 6.5–8.1)

## 2020-06-27 LAB — CBC WITH DIFFERENTIAL (CANCER CENTER ONLY)
Abs Immature Granulocytes: 0.01 10*3/uL (ref 0.00–0.07)
Basophils Absolute: 0 10*3/uL (ref 0.0–0.1)
Basophils Relative: 1 %
Eosinophils Absolute: 0.1 10*3/uL (ref 0.0–0.5)
Eosinophils Relative: 3 %
HCT: 42.1 % (ref 36.0–46.0)
Hemoglobin: 13.6 g/dL (ref 12.0–15.0)
Immature Granulocytes: 0 %
Lymphocytes Relative: 49 %
Lymphs Abs: 1.9 10*3/uL (ref 0.7–4.0)
MCH: 29.6 pg (ref 26.0–34.0)
MCHC: 32.3 g/dL (ref 30.0–36.0)
MCV: 91.5 fL (ref 80.0–100.0)
Monocytes Absolute: 0.6 10*3/uL (ref 0.1–1.0)
Monocytes Relative: 16 %
Neutro Abs: 1.2 10*3/uL — ABNORMAL LOW (ref 1.7–7.7)
Neutrophils Relative %: 31 %
Platelet Count: 264 10*3/uL (ref 150–400)
RBC: 4.6 MIL/uL (ref 3.87–5.11)
RDW: 16.4 % — ABNORMAL HIGH (ref 11.5–15.5)
WBC Count: 3.9 10*3/uL — ABNORMAL LOW (ref 4.0–10.5)
nRBC: 0 % (ref 0.0–0.2)

## 2020-06-27 MED ORDER — SODIUM CHLORIDE 0.9 % IV SOLN
Freq: Once | INTRAVENOUS | Status: AC
Start: 2020-06-27 — End: 2020-06-27
  Filled 2020-06-27: qty 250

## 2020-06-27 MED ORDER — METHOTREXATE SODIUM (PF) CHEMO INJECTION 250 MG/10ML
40.3200 mg/m2 | Freq: Once | INTRAMUSCULAR | Status: AC
Start: 2020-06-27 — End: 2020-06-27
  Administered 2020-06-27: 90 mg via INTRAVENOUS
  Filled 2020-06-27: qty 3.6

## 2020-06-27 MED ORDER — PALONOSETRON HCL INJECTION 0.25 MG/5ML
0.2500 mg | Freq: Once | INTRAVENOUS | Status: AC
  Administered 2020-06-27: 0.25 mg via INTRAVENOUS

## 2020-06-27 MED ORDER — COLD PACK MISC ONCOLOGY
1.0000 | Freq: Once | Status: DC | PRN
  Filled 2020-06-27: qty 1

## 2020-06-27 MED ORDER — PALONOSETRON HCL INJECTION 0.25 MG/5ML
INTRAVENOUS | Status: AC
  Filled 2020-06-27: qty 5

## 2020-06-27 MED ORDER — DEXAMETHASONE SODIUM PHOSPHATE 100 MG/10ML IJ SOLN
10.0000 mg | Freq: Once | INTRAMUSCULAR | Status: AC
  Administered 2020-06-27: 10 mg via INTRAVENOUS
  Filled 2020-06-27: qty 10

## 2020-06-27 MED ORDER — SODIUM CHLORIDE 0.9 % IV SOLN
600.0000 mg/m2 | Freq: Once | INTRAVENOUS | Status: AC
  Administered 2020-06-27: 1340 mg via INTRAVENOUS
  Filled 2020-06-27: qty 67

## 2020-06-27 MED ORDER — FLUOROURACIL CHEMO INJECTION 2.5 GM/50ML
600.0000 mg/m2 | Freq: Once | INTRAVENOUS | Status: AC
Start: 2020-06-27 — End: 2020-06-27
  Administered 2020-06-27: 1350 mg via INTRAVENOUS
  Filled 2020-06-27: qty 27

## 2020-06-27 NOTE — Patient Instructions (Signed)
[facility]  Discharge Instructions: Thank you for choosing [facility] to provide your oncology and hematology care.   If you have a lab appointment with the [facility], please go directly to the [facility] and check in at the registration area.   Wear comfortable clothing and clothing appropriate for easy access to any Portacath or PICC line.   We strive to give you quality time with your provider. You may need to reschedule your appointment if you arrive late (15 or more minutes).  Arriving late affects you and other patients whose appointments are after yours.  Also, if you miss three or more appointments without notifying the office, you may be dismissed from the clinic at the provider's discretion.      For prescription refill requests, have your pharmacy contact our office and allow 72 hours for refills to be completed.    Today you received the following chemotherapy and/or immunotherapy agents: Cytoxan, Methotrexate, 5FU     To help prevent nausea and vomiting after your treatment, we encourage you to take your nausea medication as directed.  BELOW ARE SYMPTOMS THAT SHOULD BE REPORTED IMMEDIATELY: . *FEVER GREATER THAN 100.4 F (38 C) OR HIGHER . *CHILLS OR SWEATING . *NAUSEA AND VOMITING THAT IS NOT CONTROLLED WITH YOUR NAUSEA MEDICATION . *UNUSUAL SHORTNESS OF BREATH . *UNUSUAL BRUISING OR BLEEDING . *URINARY PROBLEMS (pain or burning when urinating, or frequent urination) . *BOWEL PROBLEMS (unusual diarrhea, constipation, pain near the anus) . TENDERNESS IN MOUTH AND THROAT WITH OR WITHOUT PRESENCE OF ULCERS (sore throat, sores in mouth, or a toothache) . UNUSUAL RASH, SWELLING OR PAIN  . UNUSUAL VAGINAL DISCHARGE OR ITCHING   Items with * indicate a potential emergency and should be followed up as soon as possible or go to the Emergency Department if any problems should occur.  Please show the CHEMOTHERAPY ALERT CARD or  IMMUNOTHERAPY ALERT CARD at check-in to the Emergency Department and triage nurse.  Should you have questions after your visit or need to cancel or reschedule your appointment, please contact [facility]  Dept: [phone]  and follow the prompts.  Office hours are 8:00 a.m. to 4:30 p.m. Monday - Friday. Please note that voicemails left after 4:00 p.m. may not be returned until the following business day.  We are closed weekends and major holidays. You have access to a nurse at all times for urgent questions. Please call the main number to the clinic Dept: [phone] and follow the prompts.   For any non-urgent questions, you may also contact your provider using MyChart. We now offer e-Visits for anyone 60 and older to request care online for non-urgent symptoms. For details visit [email or web address].   Also download the MyChart app! Go to the app store, search "MyChart", open the app, select [facility], and log in with your MyChart username and password.  Due to Covid, a mask is required upon entering the hospital/clinic. If you do not have a mask, one will be given to you upon arrival. For doctor visits, patients may have 1 support person aged 60 or older with them. For treatment visits, patients cannot have anyone with them due to current Covid guidelines and our immunocompromised population.

## 2020-06-27 NOTE — Telephone Encounter (Signed)
Per MD review of [place] of 1.2 ok to treat given.  Informed treatment room nurse.

## 2020-06-28 ENCOUNTER — Inpatient Hospital Stay: Payer: 59

## 2020-06-28 ENCOUNTER — Other Ambulatory Visit: Payer: Self-pay

## 2020-06-28 DIAGNOSIS — Z5111 Encounter for antineoplastic chemotherapy: Secondary | ICD-10-CM | POA: Diagnosis not present

## 2020-06-28 DIAGNOSIS — C50412 Malignant neoplasm of upper-outer quadrant of left female breast: Secondary | ICD-10-CM

## 2020-06-28 MED ORDER — FILGRASTIM-SNDZ 480 MCG/0.8ML IJ SOSY
480.0000 ug | PREFILLED_SYRINGE | Freq: Once | INTRAMUSCULAR | Status: AC
Start: 2020-06-28 — End: 2020-06-28
  Administered 2020-06-28: 480 ug via SUBCUTANEOUS

## 2020-06-28 MED ORDER — FILGRASTIM-SNDZ 480 MCG/0.8ML IJ SOSY
PREFILLED_SYRINGE | INTRAMUSCULAR | Status: AC
  Filled 2020-06-28: qty 0.8

## 2020-06-28 NOTE — Patient Instructions (Signed)

## 2020-06-29 ENCOUNTER — Inpatient Hospital Stay: Payer: 59

## 2020-06-29 VITALS — BP 116/73 | HR 82 | Temp 98.6°F | Resp 18

## 2020-06-29 DIAGNOSIS — Z170 Estrogen receptor positive status [ER+]: Secondary | ICD-10-CM

## 2020-06-29 DIAGNOSIS — C50412 Malignant neoplasm of upper-outer quadrant of left female breast: Secondary | ICD-10-CM

## 2020-06-29 DIAGNOSIS — Z5111 Encounter for antineoplastic chemotherapy: Secondary | ICD-10-CM | POA: Diagnosis not present

## 2020-06-29 MED ORDER — FILGRASTIM-SNDZ 480 MCG/0.8ML IJ SOSY
480.0000 ug | PREFILLED_SYRINGE | Freq: Once | INTRAMUSCULAR | Status: AC
  Administered 2020-06-29: 480 ug via SUBCUTANEOUS

## 2020-06-29 MED ORDER — FILGRASTIM-SNDZ 480 MCG/0.8ML IJ SOSY
PREFILLED_SYRINGE | INTRAMUSCULAR | Status: AC
  Filled 2020-06-29: qty 0.8

## 2020-06-29 NOTE — Patient Instructions (Signed)

## 2020-06-30 ENCOUNTER — Other Ambulatory Visit: Payer: Self-pay

## 2020-06-30 ENCOUNTER — Inpatient Hospital Stay: Payer: 59

## 2020-06-30 VITALS — BP 140/67 | HR 82 | Temp 98.4°F | Resp 17

## 2020-06-30 DIAGNOSIS — Z170 Estrogen receptor positive status [ER+]: Secondary | ICD-10-CM

## 2020-06-30 DIAGNOSIS — C50412 Malignant neoplasm of upper-outer quadrant of left female breast: Secondary | ICD-10-CM

## 2020-06-30 DIAGNOSIS — Z5111 Encounter for antineoplastic chemotherapy: Secondary | ICD-10-CM | POA: Diagnosis not present

## 2020-06-30 MED ORDER — FILGRASTIM-SNDZ 480 MCG/0.8ML IJ SOSY
480.0000 ug | PREFILLED_SYRINGE | Freq: Once | INTRAMUSCULAR | Status: AC
Start: 2020-06-30 — End: 2020-06-30
  Administered 2020-06-30: 480 ug via SUBCUTANEOUS

## 2020-06-30 NOTE — Patient Instructions (Signed)

## 2020-07-02 ENCOUNTER — Other Ambulatory Visit: Payer: Self-pay | Admitting: *Deleted

## 2020-07-02 ENCOUNTER — Other Ambulatory Visit: Payer: Self-pay

## 2020-07-02 ENCOUNTER — Ambulatory Visit: Payer: 59 | Attending: Oncology

## 2020-07-02 ENCOUNTER — Telehealth: Payer: Self-pay | Admitting: *Deleted

## 2020-07-02 DIAGNOSIS — Z483 Aftercare following surgery for neoplasm: Secondary | ICD-10-CM | POA: Insufficient documentation

## 2020-07-02 DIAGNOSIS — R222 Localized swelling, mass and lump, trunk: Secondary | ICD-10-CM | POA: Diagnosis present

## 2020-07-02 DIAGNOSIS — R293 Abnormal posture: Secondary | ICD-10-CM | POA: Diagnosis present

## 2020-07-02 DIAGNOSIS — R2230 Localized swelling, mass and lump, unspecified upper limb: Secondary | ICD-10-CM

## 2020-07-02 DIAGNOSIS — M25512 Pain in left shoulder: Secondary | ICD-10-CM | POA: Insufficient documentation

## 2020-07-02 MED ORDER — FLUCONAZOLE 100 MG PO TABS: 100.0000 mg | ORAL_TABLET | Freq: Every day | ORAL | 0 refills | Status: DC
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
## 2020-07-02 NOTE — Telephone Encounter (Signed)
Received call from patient stating she has just finished her last chemo and seems to have some vaginal yeast infection going on.  She had requested Diflucan to help with this.  Rx sent to her pharmacy. Patient appreciative.

## 2020-07-02 NOTE — Patient Instructions (Signed)
Patient was instructed today in a home exercise program today for post op shoulder range of motion. These included active assist shoulder flexion in supine, scapular retraction, and hands behind head external rotation in supine.  She was encouraged to do these twice a day, holding 3 seconds and repeating 5 times.  Educated on importance of relaxing to perform with less difficulty

## 2020-07-04 ENCOUNTER — Other Ambulatory Visit: Payer: Self-pay

## 2020-07-04 ENCOUNTER — Emergency Department (HOSPITAL_COMMUNITY)
Admission: EM | Admit: 2020-07-04 | Discharge: 2020-07-04 | Disposition: A | Discharge status: Home / Self Care | Payer: 59 | Attending: Emergency Medicine | Admitting: Emergency Medicine

## 2020-07-04 ENCOUNTER — Encounter (HOSPITAL_COMMUNITY): Payer: Self-pay

## 2020-07-04 DIAGNOSIS — Z79899 Other long term (current) drug therapy: Secondary | ICD-10-CM | POA: Insufficient documentation

## 2020-07-04 DIAGNOSIS — I10 Essential (primary) hypertension: Secondary | ICD-10-CM

## 2020-07-04 DIAGNOSIS — Z853 Personal history of malignant neoplasm of breast: Secondary | ICD-10-CM | POA: Diagnosis not present

## 2020-07-04 NOTE — ED Triage Notes (Signed)
Pt arrived via walk in, c/o HTN, was at office today for surgery consult and found BP was high 200's/100s. Hx of HTN, no recent med changes. No misses doses of amlodipine.
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
## 2020-07-05 ENCOUNTER — Other Ambulatory Visit: Payer: Self-pay

## 2020-07-06 ENCOUNTER — Ambulatory Visit (INDEPENDENT_AMBULATORY_CARE_PROVIDER_SITE_OTHER): Payer: 59 | Admitting: Internal Medicine

## 2020-07-06 ENCOUNTER — Encounter: Payer: Self-pay | Admitting: Internal Medicine

## 2020-07-06 DIAGNOSIS — C50412 Malignant neoplasm of upper-outer quadrant of left female breast: Secondary | ICD-10-CM

## 2020-07-06 DIAGNOSIS — F339 Major depressive disorder, recurrent, unspecified: Secondary | ICD-10-CM | POA: Diagnosis not present

## 2020-07-06 DIAGNOSIS — I158 Other secondary hypertension: Secondary | ICD-10-CM

## 2020-07-06 DIAGNOSIS — Z170 Estrogen receptor positive status [ER+]: Secondary | ICD-10-CM

## 2020-07-06 MED ORDER — HYDROCHLOROTHIAZIDE 25 MG PO TABS: 25.0000 mg | ORAL_TABLET | Freq: Every day | ORAL | 1 refills | Status: DC

## 2020-07-06 NOTE — Patient Instructions (Signed)
-  Nice seeing you today!!  -Start HCTZ 25 mg daily.  -Check BP 3 times a week and bring measurements to your next visit.  -Remember to schedule CBT sessions.  -Schedule follow up in 8-12 weeks for your physical. Please come in fasting that day.
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
## 2020-07-11 ENCOUNTER — Ambulatory Visit: Payer: 59

## 2020-07-11 ENCOUNTER — Other Ambulatory Visit: Payer: Self-pay

## 2020-07-11 DIAGNOSIS — R222 Localized swelling, mass and lump, trunk: Secondary | ICD-10-CM

## 2020-07-11 DIAGNOSIS — R2230 Localized swelling, mass and lump, unspecified upper limb: Secondary | ICD-10-CM

## 2020-07-11 DIAGNOSIS — M25512 Pain in left shoulder: Secondary | ICD-10-CM

## 2020-07-11 DIAGNOSIS — Z483 Aftercare following surgery for neoplasm: Secondary | ICD-10-CM

## 2020-07-11 DIAGNOSIS — R293 Abnormal posture: Secondary | ICD-10-CM

## 2020-07-11 NOTE — Patient Instructions (Addendum)
SHOULDER: Flexion - Supine (Cane)        Cancer Rehab [phone]    Hold cane in both hands. Raise arms up overhead. Do not allow back to arch. Hold _5__ seconds. Do __5-10__ times; __1-2__ times a day.   SELF ASSISTED WITH OBJECT: Shoulder Abduction / Adduction - Supine    Hold cane with both hands. Move both arms from side to side, keep elbows straight.  Hold when stretch felt for __5__ seconds. Repeat __5-10__ times; __1-2__ times a day. Once this becomes easier progress to third picture bringing affected arm towards ear by staying out to side. Same hold for _5_seconds. Repeat  _5-10_ times, _1-2_ times/day.  SHOULDER: External Rotation - Supine (Cane)    Hold cane with both hands. Rotate arm away from body. Keep elbow on floor and next to body. _5-10__ reps per set, hold 5 seconds, _1-2__ sets per day. Add towel to keep elbow at side.   Self manual lymph drainage: Perform this sequence once a day.  Only give enough pressure to your skin to make the skin move.  Hug yourself.  Do circles at your neck just above your collarbones.  Repeat this 10 times.  Diaphragmatic - Supine   Inhale through nose making navel move out toward hands. Exhale through puckered lips, hands follow navel in. Repeat _5__ times. Rest _10__ seconds between repeats.    Axilla - One at a Time   Using full weight of flat hand and fingers at center of uninvolved armpit, make _10__ in-place circles.   LEG: Inguinal Nodes Stimulation   With small finger side of hand against hip crease on involved side, gently perform circles at the crease. Repeat __10_ times.   1) Axilla to Inguinal Nodes - Sweep   On involved side, sweep _4__ times from armpit along side of trunk to hip crease.  Now gently stretch skin from the involved side to the uninvolved side across the chest at the shoulder line.  Repeat that 4 times.  Then focus on upper chest above incision directing fluid towards chest pathway. Below  incision at side of trunk focus fluid towards side pathway using gentle skin stretches for both.  Finish by doing the pathways as described above going from your involved armpit to the same side groin and going across your upper chest from the involved shoulder to the uninvolved shoulder.  Repeat the steps above where you do circles in your left groin and right armpit.
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
## 2020-07-13 ENCOUNTER — Ambulatory Visit: Payer: 59 | Admitting: Physical Therapy

## 2020-07-13 ENCOUNTER — Other Ambulatory Visit: Payer: Self-pay

## 2020-07-13 ENCOUNTER — Encounter: Payer: Self-pay | Admitting: Physical Therapy

## 2020-07-13 DIAGNOSIS — M25512 Pain in left shoulder: Secondary | ICD-10-CM

## 2020-07-13 DIAGNOSIS — R293 Abnormal posture: Secondary | ICD-10-CM

## 2020-07-13 DIAGNOSIS — R222 Localized swelling, mass and lump, trunk: Secondary | ICD-10-CM

## 2020-07-13 DIAGNOSIS — R2230 Localized swelling, mass and lump, unspecified upper limb: Secondary | ICD-10-CM

## 2020-07-13 DIAGNOSIS — Z483 Aftercare following surgery for neoplasm: Secondary | ICD-10-CM
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
## 2020-07-18 ENCOUNTER — Ambulatory Visit: Payer: 59 | Attending: Oncology | Admitting: Physical Therapy

## 2020-07-18 ENCOUNTER — Other Ambulatory Visit: Payer: Self-pay

## 2020-07-18 DIAGNOSIS — R222 Localized swelling, mass and lump, trunk: Secondary | ICD-10-CM | POA: Diagnosis present

## 2020-07-18 DIAGNOSIS — Z483 Aftercare following surgery for neoplasm: Secondary | ICD-10-CM | POA: Diagnosis present

## 2020-07-18 DIAGNOSIS — R2230 Localized swelling, mass and lump, unspecified upper limb: Secondary | ICD-10-CM

## 2020-07-18 DIAGNOSIS — M25512 Pain in left shoulder: Secondary | ICD-10-CM | POA: Diagnosis present

## 2020-07-18 DIAGNOSIS — R293 Abnormal posture: Secondary | ICD-10-CM | POA: Insufficient documentation
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
## 2020-07-23 ENCOUNTER — Ambulatory Visit: Payer: 59

## 2020-07-23 ENCOUNTER — Other Ambulatory Visit: Payer: Self-pay

## 2020-07-23 DIAGNOSIS — M25512 Pain in left shoulder: Secondary | ICD-10-CM

## 2020-07-23 DIAGNOSIS — Z483 Aftercare following surgery for neoplasm: Secondary | ICD-10-CM

## 2020-07-23 DIAGNOSIS — R2230 Localized swelling, mass and lump, unspecified upper limb: Secondary | ICD-10-CM

## 2020-07-23 DIAGNOSIS — R222 Localized swelling, mass and lump, trunk: Secondary | ICD-10-CM

## 2020-07-23 DIAGNOSIS — R293 Abnormal posture: Secondary | ICD-10-CM
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
## 2020-07-25 ENCOUNTER — Other Ambulatory Visit: Payer: Self-pay

## 2020-07-25 ENCOUNTER — Emergency Department (HOSPITAL_COMMUNITY)
Admission: EM | Admit: 2020-07-25 | Discharge: 2020-07-25 | Discharge status: Against Medical Advice | Payer: 59 | Attending: Emergency Medicine | Admitting: Emergency Medicine

## 2020-07-25 ENCOUNTER — Encounter (HOSPITAL_COMMUNITY): Payer: Self-pay | Admitting: *Deleted

## 2020-07-25 ENCOUNTER — Ambulatory Visit (INDEPENDENT_AMBULATORY_CARE_PROVIDER_SITE_OTHER): Payer: 59 | Admitting: Psychologist

## 2020-07-25 DIAGNOSIS — Z79899 Other long term (current) drug therapy: Secondary | ICD-10-CM | POA: Diagnosis not present

## 2020-07-25 DIAGNOSIS — F32A Depression, unspecified: Secondary | ICD-10-CM | POA: Diagnosis present

## 2020-07-25 DIAGNOSIS — R45851 Suicidal ideations: Secondary | ICD-10-CM | POA: Diagnosis not present

## 2020-07-25 DIAGNOSIS — F332 Major depressive disorder, recurrent severe without psychotic features: Secondary | ICD-10-CM | POA: Diagnosis not present

## 2020-07-25 DIAGNOSIS — Z853 Personal history of malignant neoplasm of breast: Secondary | ICD-10-CM | POA: Insufficient documentation

## 2020-07-25 DIAGNOSIS — I10 Essential (primary) hypertension: Secondary | ICD-10-CM | POA: Diagnosis not present

## 2020-07-25 DIAGNOSIS — Z20822 Contact with and (suspected) exposure to covid-19: Secondary | ICD-10-CM | POA: Diagnosis not present

## 2020-07-25 LAB — CBC WITH DIFFERENTIAL/PLATELET
Abs Immature Granulocytes: 0.03 10*3/uL (ref 0.00–0.07)
Basophils Absolute: 0 10*3/uL (ref 0.0–0.1)
Basophils Relative: 1 %
Eosinophils Absolute: 0.1 10*3/uL (ref 0.0–0.5)
Eosinophils Relative: 1 %
HCT: 38.8 % (ref 36.0–46.0)
Hemoglobin: 12.4 g/dL (ref 12.0–15.0)
Immature Granulocytes: 1 %
Lymphocytes Relative: 23 %
Lymphs Abs: 1.5 10*3/uL (ref 0.7–4.0)
MCH: 29.7 pg (ref 26.0–34.0)
MCHC: 32 g/dL (ref 30.0–36.0)
MCV: 93 fL (ref 80.0–100.0)
Monocytes Absolute: 1 10*3/uL (ref 0.1–1.0)
Monocytes Relative: 15 %
Neutro Abs: 3.9 10*3/uL (ref 1.7–7.7)
Neutrophils Relative %: 59 %
Platelets: 194 10*3/uL (ref 150–400)
RBC: 4.17 MIL/uL (ref 3.87–5.11)
RDW: 14.5 % (ref 11.5–15.5)
WBC: 6.5 10*3/uL (ref 4.0–10.5)
nRBC: 0 % (ref 0.0–0.2)

## 2020-07-25 LAB — COMPREHENSIVE METABOLIC PANEL
ALT: 26 U/L (ref 0–44)
AST: 23 U/L (ref 15–41)
Albumin: 3.7 g/dL (ref 3.5–5.0)
Alkaline Phosphatase: 90 U/L (ref 38–126)
Anion gap: 8 (ref 5–15)
BUN: 14 mg/dL (ref 6–20)
CO2: 29 mmol/L (ref 22–32)
Calcium: 9.1 mg/dL (ref 8.9–10.3)
Chloride: 102 mmol/L (ref 98–111)
Creatinine, Ser: 0.97 mg/dL (ref 0.44–1.00)
GFR, Estimated: >60 mL/min (ref >60)
Glucose, Bld: 137 mg/dL — ABNORMAL HIGH (ref 70–99)
Potassium: 3 mmol/L — ABNORMAL LOW (ref 3.5–5.1)
Sodium: 139 mmol/L (ref 135–145)
Total Bilirubin: 0.5 mg/dL (ref 0.3–1.2)
Total Protein: 8 g/dL (ref 6.5–8.1)

## 2020-07-25 LAB — ETHANOL: Alcohol, Ethyl (B): <10 mg/dL (ref <10)

## 2020-07-25 LAB — RESP PANEL BY RT-PCR (FLU A&B, COVID) ARPGX2
Influenza A by PCR: NEGATIVE
Influenza B by PCR: NEGATIVE
SARS Coronavirus 2 by RT PCR: NEGATIVE

## 2020-07-25 MED ORDER — GABAPENTIN 300 MG PO CAPS: 300.0000 mg | ORAL_CAPSULE | Freq: Every day | ORAL | Status: DC

## 2020-07-25 MED ORDER — VENLAFAXINE HCL ER 37.5 MG PO CP24: 37.5000 mg | ORAL_CAPSULE | Freq: Every day | ORAL | Status: DC

## 2020-07-25 MED ORDER — HYDROCHLOROTHIAZIDE 25 MG PO TABS
25.0000 mg | ORAL_TABLET | Freq: Every day | ORAL | Status: DC
  Filled 2020-07-25: qty 1

## 2020-07-25 MED ORDER — DOXYCYCLINE HYCLATE 100 MG PO TABS: 100.0000 mg | ORAL_TABLET | Freq: Two times a day (BID) | ORAL | Status: DC

## 2020-07-25 MED ORDER — GABAPENTIN (ONCE-DAILY) 300 MG PO TABS
300.0000 mg | ORAL_TABLET | Freq: Every day | ORAL | Status: DC
Start: 2020-07-25 — End: 2020-07-25

## 2020-07-25 MED ORDER — ACETAMINOPHEN 500 MG PO TABS: 500.0000 mg | ORAL_TABLET | Freq: Four times a day (QID) | ORAL | Status: DC | PRN

## 2020-07-25 MED ORDER — AMLODIPINE BESYLATE 5 MG PO TABS: 10.0000 mg | ORAL_TABLET | Freq: Every day | ORAL | Status: DC

## 2020-07-25 NOTE — ED Triage Notes (Signed)
Pt's counselor recommended she come to ED for suicidal ideation after talking to her this morning. She would overdose on pills. She has attempted suicide in 1995 with pills.

## 2020-07-25 NOTE — ED Provider Notes (Cosign Needed)
Emergency Medicine Provider Triage Evaluation Note  Lauren Garrison , a 60 y.o. female  was evaluated in triage.  Pt complains of a suicidal ideation without plan.  Patient reports history of breast cancer, recently completed treatment, has been feeling depressed and suicidal for several months, followed up today for the first time with mental health therapist who advised her she would be required to go to the emergency room today for evaluation.  Patient is here voluntarily, was told that the therapist would call the police for wellness check if she did not come voluntarily.  Reports prior suicide attempt in 1995 when she overdosed on medications and woke up in the emergency room.  Patient feels like she would not attempt suicide because "I could not do that to my family."  Patient is tearful, has a lot of stress in life including financial stress.  Has her son and daughter-in-law in town from [place] and helping care for her through her cancer treatment.  Tried antidepressant from her oncologist, is taking without any improvement in symptoms, is here for medication adjustment. Reports breast infection, on her third round of antibiotics was scheduled to see her surgeon today at 4 PM.  Review of Systems  Positive: Depression  Negative: fever  Physical Exam  BP (!) 161/91 (BP Location: Right Arm)   Pulse 93   Temp 98.7 F (37.1 C) (Oral)   Resp 17   SpO2 96%  Gen:   Awake, no distress   Resp:  Normal effort  MSK:   Moves extremities without difficulty  Other:  tearful  Medical Decision Making  Medically screening exam initiated at 11:36 AM.  Appropriate orders placed.  Liadan Guizar was informed that the remainder of the evaluation will be completed by another provider, this initial triage assessment does not replace that evaluation, and the importance of remaining in the ED until their evaluation is complete.     Tacy Learn, PA-C 07/25/20 1139

## 2020-07-25 NOTE — ED Notes (Signed)
Patient walked outside. Patient reports she is leaving and has an appointment to go to at 4:30 pm.   Explained for patient to come back inside so that this nurse could give her outpatient resources for [facility] and to talk with the provider. Patient does not want to come back in or additional resources.   Tomi Bamberger, EDP made aware and is not going to IVC patient at this time. Patient can leave AMA.
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
## 2020-08-07 ENCOUNTER — Ambulatory Visit (INDEPENDENT_AMBULATORY_CARE_PROVIDER_SITE_OTHER): Payer: 59 | Admitting: Psychologist

## 2020-08-07 DIAGNOSIS — F332 Major depressive disorder, recurrent severe without psychotic features: Secondary | ICD-10-CM

## 2020-08-08 ENCOUNTER — Ambulatory Visit (INDEPENDENT_AMBULATORY_CARE_PROVIDER_SITE_OTHER): Payer: 59 | Admitting: Nurse Practitioner

## 2020-08-08 ENCOUNTER — Other Ambulatory Visit: Payer: Self-pay

## 2020-08-08 VITALS — BP 128/77 | HR 97 | Ht 65.0 in | Wt 253.0 lb

## 2020-08-08 DIAGNOSIS — Z01419 Encounter for gynecological examination (general) (routine) without abnormal findings: Secondary | ICD-10-CM | POA: Diagnosis not present

## 2020-08-08 DIAGNOSIS — C50412 Malignant neoplasm of upper-outer quadrant of left female breast: Secondary | ICD-10-CM

## 2020-08-08 DIAGNOSIS — Z170 Estrogen receptor positive status [ER+]: Secondary | ICD-10-CM

## 2020-08-08 DIAGNOSIS — Z780 Asymptomatic menopausal state: Secondary | ICD-10-CM | POA: Diagnosis not present

## 2020-08-08 NOTE — Progress Notes (Signed)
   Lauren Garrison 1960-05-18 [id number]   History:  60 y.o. G4 P4 presents for annual exam without GYN complaints. Postmenopausal - no HRT, no bleeding. Normal pap history. 2005 left breast cancer and again July 2021 ER/PR+ managed with mastectomy and chemotherapy (completed 06/2020). She has struggled with cellulitis and lymphadenopathy in that breast. Had right breast reduction and is considering reconstruction of left breast. She has been struggling with depression and was started on Effexor. HTN managed by PCP, she was taking amlodipine and HCTZ, but stopped taking a while back to do natural management. Today her BP is good. She checks a few times per week.   Gynecologic History No LMP recorded. Patient is postmenopausal.   Last Pap: 08/03/2019 Results were: normal Last mammogram: 08/2019. Results were: highly suspicious mass at 2 o'clock in left breast (cancer diagnosis following) Last colonoscopy: 08/2019. Results were: Benign polyps Last Dexa: Not indicated  Past medical history, past surgical history, family history and social history were all reviewed and documented in the EPIC chart.  ROS:  A ROS was performed and pertinent positives and negatives are included.  Exam:  Vitals:   08/08/20 1154  BP: 128/77  Pulse: 97  SpO2: 98%  Weight: 253 lb (114.8 kg)  Height: 5\' 5"  (1.651 m)   Body mass index is 42.1 kg/m.  General appearance:  Normal Thyroid:  Symmetrical, normal in size, without palpable masses or nodularity. Respiratory  Auscultation:  Clear without wheezing or rhonchi Cardiovascular  Auscultation:  Regular rate, without rubs, murmurs or gallops  Edema/varicosities:  Not grossly evident Abdominal  Soft,nontender, without masses, guarding or rebound.  Liver/spleen:  No organomegaly noted  Hernia:  None appreciated  Skin  Inspection:  Grossly normal   Breasts: Examined lying and sitting.   Right: Reduction. Scars are heal well. No masses, redness,  swelling, nipple discharge, retractions  Left: Mastectomy Gentitourinary   Inguinal/mons:  Normal without inguinal adenopathy  External genitalia:  Normal  BUS/Urethra/Skene's glands:  Normal  Vagina:  Normal  Cervix:  Normal  Uterus:  Difficult to palpate due to body habitus but no gross masses or tenderness  Adnexa/parametria:     Rt: Without masses or tenderness.   Lt: Without masses or tenderness.  Anus and perineum: Normal, non-bleeding hemorrhoid  Digital rectal exam: Normal sphincter tone without palpated masses or tenderness  Assessment/Plan:  60 y.o. G4 P4 presents as new patient to establish care.   Well female exam with routine gynecological exam - Plan: Education provided on SBEs, importance of preventative screenings, current guidelines, high calcium diet, regular exercise, and multivitamin daily. Labs done with PCP and oncology.   Malignant neoplasm of upper-outer quadrant of left breast in female, estrogen receptor positive [facility]) - July 2021 T2N1, stage IIA, ER/PR+ managed with mastectomy and chemotherapy (completed 06/2020). She has struggled with cellulitis and lymphadenopathy in that breast. Had right breast reduction and is considering reconstruction of left breast. Being follow by oncology.   Postmenopausal - no HRT, no bleeding.  Screening for cervical cancer - Normal Pap history.  Will repeat at 5-year interval per guidelines.  Screening for breast cancer - Continue annual screenings of right breast.  Normal breast exam today.  Screening for colon cancer - Will repeat at GI's recommended interval.   Follow up in 1 year for annual    [place], 12:02 PM 08/08/2020

## 2020-08-09 ENCOUNTER — Other Ambulatory Visit: Payer: Self-pay

## 2020-08-09 ENCOUNTER — Ambulatory Visit: Payer: 59

## 2020-08-09 DIAGNOSIS — M25512 Pain in left shoulder: Secondary | ICD-10-CM

## 2020-08-09 DIAGNOSIS — Z483 Aftercare following surgery for neoplasm: Secondary | ICD-10-CM

## 2020-08-09 DIAGNOSIS — R222 Localized swelling, mass and lump, trunk: Secondary | ICD-10-CM

## 2020-08-09 DIAGNOSIS — R2230 Localized swelling, mass and lump, unspecified upper limb: Secondary | ICD-10-CM

## 2020-08-09 DIAGNOSIS — R293 Abnormal posture: Secondary | ICD-10-CM
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
## 2020-08-13 ENCOUNTER — Other Ambulatory Visit: Payer: Self-pay

## 2020-08-13 ENCOUNTER — Ambulatory Visit: Payer: 59

## 2020-08-13 DIAGNOSIS — M25512 Pain in left shoulder: Secondary | ICD-10-CM

## 2020-08-13 DIAGNOSIS — Z483 Aftercare following surgery for neoplasm: Secondary | ICD-10-CM

## 2020-08-13 DIAGNOSIS — R293 Abnormal posture: Secondary | ICD-10-CM

## 2020-08-13 DIAGNOSIS — R2230 Localized swelling, mass and lump, unspecified upper limb: Secondary | ICD-10-CM
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
## 2020-08-15 ENCOUNTER — Ambulatory Visit: Payer: 59

## 2020-08-15 ENCOUNTER — Other Ambulatory Visit: Payer: Self-pay

## 2020-08-15 DIAGNOSIS — M25512 Pain in left shoulder: Secondary | ICD-10-CM

## 2020-08-15 DIAGNOSIS — R293 Abnormal posture: Secondary | ICD-10-CM

## 2020-08-15 DIAGNOSIS — Z483 Aftercare following surgery for neoplasm: Secondary | ICD-10-CM

## 2020-08-15 DIAGNOSIS — R2230 Localized swelling, mass and lump, unspecified upper limb: Secondary | ICD-10-CM
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
## 2020-08-16 ENCOUNTER — Inpatient Hospital Stay (HOSPITAL_BASED_OUTPATIENT_CLINIC_OR_DEPARTMENT_OTHER): Payer: 59 | Admitting: Oncology

## 2020-08-16 ENCOUNTER — Inpatient Hospital Stay: Payer: 59 | Attending: Oncology

## 2020-08-16 ENCOUNTER — Other Ambulatory Visit: Payer: Self-pay

## 2020-08-16 ENCOUNTER — Telehealth: Payer: Self-pay | Admitting: Oncology

## 2020-08-16 VITALS — BP 140/67 | HR 70 | Temp 97.9°F | Resp 18 | Ht 65.0 in | Wt 254.2 lb

## 2020-08-16 DIAGNOSIS — C50412 Malignant neoplasm of upper-outer quadrant of left female breast: Secondary | ICD-10-CM | POA: Diagnosis not present

## 2020-08-16 DIAGNOSIS — Z923 Personal history of irradiation: Secondary | ICD-10-CM | POA: Insufficient documentation

## 2020-08-16 DIAGNOSIS — Z170 Estrogen receptor positive status [ER+]: Secondary | ICD-10-CM | POA: Diagnosis not present

## 2020-08-16 DIAGNOSIS — Z560 Unemployment, unspecified: Secondary | ICD-10-CM | POA: Insufficient documentation

## 2020-08-16 DIAGNOSIS — N6001 Solitary cyst of right breast: Secondary | ICD-10-CM | POA: Insufficient documentation

## 2020-08-16 DIAGNOSIS — F32A Depression, unspecified: Secondary | ICD-10-CM | POA: Insufficient documentation

## 2020-08-16 DIAGNOSIS — M797 Fibromyalgia: Secondary | ICD-10-CM | POA: Insufficient documentation

## 2020-08-16 DIAGNOSIS — Z79811 Long term (current) use of aromatase inhibitors: Secondary | ICD-10-CM | POA: Insufficient documentation

## 2020-08-16 DIAGNOSIS — Z79899 Other long term (current) drug therapy: Secondary | ICD-10-CM | POA: Insufficient documentation

## 2020-08-16 DIAGNOSIS — Z888 Allergy status to other drugs, medicaments and biological substances status: Secondary | ICD-10-CM | POA: Insufficient documentation

## 2020-08-16 DIAGNOSIS — Z9012 Acquired absence of left breast and nipple: Secondary | ICD-10-CM | POA: Insufficient documentation

## 2020-08-16 DIAGNOSIS — Z8249 Family history of ischemic heart disease and other diseases of the circulatory system: Secondary | ICD-10-CM | POA: Insufficient documentation

## 2020-08-16 DIAGNOSIS — N6332 Unspecified lump in axillary tail of the left breast: Secondary | ICD-10-CM | POA: Insufficient documentation

## 2020-08-16 DIAGNOSIS — D701 Agranulocytosis secondary to cancer chemotherapy: Secondary | ICD-10-CM | POA: Insufficient documentation

## 2020-08-16 DIAGNOSIS — Z881 Allergy status to other antibiotic agents status: Secondary | ICD-10-CM | POA: Insufficient documentation

## 2020-08-16 DIAGNOSIS — Z833 Family history of diabetes mellitus: Secondary | ICD-10-CM | POA: Insufficient documentation

## 2020-08-16 DIAGNOSIS — Z6841 Body Mass Index (BMI) 40.0 and over, adult: Secondary | ICD-10-CM | POA: Insufficient documentation

## 2020-08-16 DIAGNOSIS — Z808 Family history of malignant neoplasm of other organs or systems: Secondary | ICD-10-CM | POA: Insufficient documentation

## 2020-08-16 DIAGNOSIS — Z882 Allergy status to sulfonamides status: Secondary | ICD-10-CM | POA: Insufficient documentation

## 2020-08-16 DIAGNOSIS — I10 Essential (primary) hypertension: Secondary | ICD-10-CM | POA: Insufficient documentation

## 2020-08-16 LAB — CMP (CANCER CENTER ONLY)
ALT: 25 U/L (ref 0–44)
AST: 21 U/L (ref 15–41)
Albumin: 3.5 g/dL (ref 3.5–5.0)
Alkaline Phosphatase: 112 U/L (ref 38–126)
Anion gap: 8 (ref 5–15)
BUN: 14 mg/dL (ref 6–20)
CO2: 25 mmol/L (ref 22–32)
Calcium: 9.2 mg/dL (ref 8.9–10.3)
Chloride: 107 mmol/L (ref 98–111)
Creatinine: 0.9 mg/dL (ref 0.44–1.00)
GFR, Estimated: >60 mL/min (ref >60)
Glucose, Bld: 125 mg/dL — ABNORMAL HIGH (ref 70–99)
Potassium: 3.7 mmol/L (ref 3.5–5.1)
Sodium: 140 mmol/L (ref 135–145)
Total Bilirubin: 0.4 mg/dL (ref 0.3–1.2)
Total Protein: 7.5 g/dL (ref 6.5–8.1)

## 2020-08-16 LAB — CBC WITH DIFFERENTIAL (CANCER CENTER ONLY)
Abs Immature Granulocytes: 0 10*3/uL (ref 0.00–0.07)
Basophils Absolute: 0 10*3/uL (ref 0.0–0.1)
Basophils Relative: 1 %
Eosinophils Absolute: 0.1 10*3/uL (ref 0.0–0.5)
Eosinophils Relative: 2 %
HCT: 38.9 % (ref 36.0–46.0)
Hemoglobin: 12.8 g/dL (ref 12.0–15.0)
Immature Granulocytes: 0 %
Lymphocytes Relative: 38 %
Lymphs Abs: 1.5 10*3/uL (ref 0.7–4.0)
MCH: 29.2 pg (ref 26.0–34.0)
MCHC: 32.9 g/dL (ref 30.0–36.0)
MCV: 88.6 fL (ref 80.0–100.0)
Monocytes Absolute: 0.5 10*3/uL (ref 0.1–1.0)
Monocytes Relative: 12 %
Neutro Abs: 1.9 10*3/uL (ref 1.7–7.7)
Neutrophils Relative %: 47 %
Platelet Count: 202 10*3/uL (ref 150–400)
RBC: 4.39 MIL/uL (ref 3.87–5.11)
RDW: 13.7 % (ref 11.5–15.5)
WBC Count: 4 10*3/uL (ref 4.0–10.5)
nRBC: 0 % (ref 0.0–0.2)

## 2020-08-16 MED ORDER — ANASTROZOLE 1 MG PO TABS: 1.0000 mg | ORAL_TABLET | Freq: Every day | ORAL | 4 refills | Status: DC

## 2020-08-16 MED ORDER — VENLAFAXINE HCL ER 150 MG PO CP24: 150.0000 mg | ORAL_CAPSULE | Freq: Every day | ORAL | 4 refills | Status: DC

## 2020-08-16 NOTE — Telephone Encounter (Signed)
Scheduled appointment per 06/30 los. Patient is aware.
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
## 2020-08-17 ENCOUNTER — Encounter: Payer: 59 | Admitting: Rehabilitation

## 2020-08-22 ENCOUNTER — Other Ambulatory Visit: Payer: Self-pay

## 2020-08-22 ENCOUNTER — Ambulatory Visit: Payer: 59 | Attending: Oncology

## 2020-08-22 DIAGNOSIS — M25512 Pain in left shoulder: Secondary | ICD-10-CM | POA: Insufficient documentation

## 2020-08-22 DIAGNOSIS — M25611 Stiffness of right shoulder, not elsewhere classified: Secondary | ICD-10-CM | POA: Insufficient documentation

## 2020-08-22 DIAGNOSIS — R2230 Localized swelling, mass and lump, unspecified upper limb: Secondary | ICD-10-CM

## 2020-08-22 DIAGNOSIS — R222 Localized swelling, mass and lump, trunk: Secondary | ICD-10-CM | POA: Insufficient documentation

## 2020-08-22 DIAGNOSIS — R293 Abnormal posture: Secondary | ICD-10-CM | POA: Insufficient documentation

## 2020-08-22 DIAGNOSIS — Z483 Aftercare following surgery for neoplasm: Secondary | ICD-10-CM
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
## 2020-08-22 NOTE — Patient Instructions (Signed)

## 2020-08-24 ENCOUNTER — Encounter: Payer: Self-pay | Admitting: Oncology

## 2020-08-29 ENCOUNTER — Ambulatory Visit: Payer: Self-pay

## 2020-08-29 ENCOUNTER — Ambulatory Visit: Payer: Self-pay | Admitting: Psychologist

## 2020-09-04 ENCOUNTER — Encounter: Payer: 59 | Admitting: Physical Therapy

## 2020-09-06 ENCOUNTER — Encounter: Payer: 59 | Admitting: Physical Therapy

## 2020-10-29 ENCOUNTER — Telehealth: Payer: Self-pay | Admitting: Neurology

## 2020-10-29 NOTE — Telephone Encounter (Signed)
Pt unable to schedule her initial CPAP f/u at this time due to no insurance.  Pt states he has made her DME aware that she is using the CPAP but unable to make appointment due to being uninsured and unemployed at this time.

## 2020-10-29 NOTE — Telephone Encounter (Signed)
Noted  

## 2020-10-31 NOTE — Telephone Encounter (Signed)
Thank you for the update!

## 2020-11-12 DIAGNOSIS — Z0271 Encounter for disability determination: Secondary | ICD-10-CM

## 2020-12-24 ENCOUNTER — Encounter: Payer: Self-pay | Admitting: Oncology

## 2020-12-31 ENCOUNTER — Other Ambulatory Visit: Payer: 59

## 2020-12-31 ENCOUNTER — Ambulatory Visit: Payer: 59 | Admitting: Oncology

## 2021-01-17 ENCOUNTER — Encounter: Payer: Self-pay | Admitting: Oncology

## 2021-01-17 DIAGNOSIS — S6990XA Unspecified injury of unspecified wrist, hand and finger(s), initial encounter: Secondary | ICD-10-CM | POA: Insufficient documentation

## 2021-01-20 ENCOUNTER — Encounter: Payer: Self-pay | Admitting: Oncology

## 2021-01-23 ENCOUNTER — Ambulatory Visit (INDEPENDENT_AMBULATORY_CARE_PROVIDER_SITE_OTHER): Payer: Medicaid Other | Admitting: Internal Medicine

## 2021-01-23 DIAGNOSIS — R6889 Other general symptoms and signs: Secondary | ICD-10-CM

## 2021-01-23 MED ORDER — OSELTAMIVIR PHOSPHATE 75 MG PO CAPS: 75.0000 mg | ORAL_CAPSULE | Freq: Two times a day (BID) | ORAL | 0 refills | Status: AC

## 2021-01-23 NOTE — Progress Notes (Signed)
Virtual Visit via Telephone Note  I connected with [name] on 01/23/21 at 10:00 AM EST by telephone and verified that I am speaking with the correct person using two identifiers.   I discussed the limitations, risks, security and privacy concerns of performing an evaluation and management service by telephone and the availability of in person appointments. I also discussed with the patient that there may be a patient responsible charge related to this service. The patient expressed understanding and agreed to proceed.  Location patient: home Location provider: work office Participants present for the call: patient, provider Patient did not have a visit in the prior 7 days to address this/these issue(s).   History of Present Illness:  She had initially come in as an appointment to follow-up on chronic medical conditions.  However during the rooming process she was noted to have a temperature of 102.6.  She states that she has been feeling fatigued and having body aches.  She did not know she had a fever this morning and is very apologetic.  She does tell me that her son, who lives with her, had to go to the urgent care 2 days ago where he was diagnosed with influenza A.     Observations/Objective: Patient sounds congested on the phone. I do not appreciate any increased work of breathing. Speech and thought processing are grossly intact. Patient reported vitals: Temp of 102.6   Current Outpatient Medications:    acetaminophen (TYLENOL) 500 MG tablet, Take 1 tablet (500 mg total) by mouth every 6 (six) hours as needed. For use AFTER surgery (Patient taking differently: Take 500 mg by mouth every 6 (six) hours as needed for mild pain, fever or headache.), Disp: 30 tablet, Rfl: 0   amLODipine (NORVASC) 10 MG tablet, TAKE 1 TABLET BY MOUTH EVERY DAY, Disp: 90 tablet, Rfl: 1   anastrozole (ARIMIDEX) 1 MG tablet, Take 1 tablet (1 mg total) by mouth daily., Disp: 90 tablet,  Rfl: 4   doxycycline (VIBRAMYCIN) 100 MG capsule, Take 100 mg by mouth 2 (two) times daily., Disp: , Rfl:    Gabapentin, Once-Daily, 300 MG TABS, Take 300 mg by mouth at bedtime., Disp: 30 tablet, Rfl: 5   hydrochlorothiazide (HYDRODIURIL) 25 MG tablet, Take 1 tablet (25 mg total) by mouth daily., Disp: 90 tablet, Rfl: 1   oseltamivir (TAMIFLU) 75 MG capsule, Take 1 capsule (75 mg total) by mouth 2 (two) times daily for 5 days., Disp: 10 capsule, Rfl: 0   venlafaxine XR (EFFEXOR-XR) 150 MG 24 hr capsule, Take 1 capsule (150 mg total) by mouth daily with breakfast., Disp: 90 capsule, Rfl: 4  Review of Systems:  Constitutional: Positive for fever, chills, diaphoresis, appetite change and fatigue.  HEENT: Denies photophobia, eye pain, redness, hearing loss,  mouth sores, trouble swallowing, neck pain, neck stiffness and tinnitus.   Respiratory: Denies SOB, DOE and chest tightness. Cardiovascular: Denies chest pain, palpitations and leg swelling.  Gastrointestinal: Denies nausea, vomiting, abdominal pain, diarrhea, constipation, blood in stool and abdominal distention.  Genitourinary: Denies dysuria, urgency, frequency, hematuria, flank pain and difficulty urinating.  Endocrine: Denies: hot or cold intolerance, sweats, changes in hair or nails, polyuria, polydipsia. Musculoskeletal: Denies myalgias, back pain, joint swelling, arthralgias and gait problem.  Skin: Denies pallor, rash and wound.  Neurological: Denies dizziness, seizures, syncope, weakness, light-headedness, numbness and headaches.  Hematological: Denies adenopathy. Easy bruising, personal or family bleeding history  Psychiatric/Behavioral: Denies suicidal ideation, mood changes, confusion, nervousness, sleep disturbance and  agitation   Assessment and Plan:  Flu-like symptoms  - Plan: Influenza A and B Ag, Immunoassay, oseltamivir (TAMIFLU) 75 MG capsule -With her flulike symptoms, high fever, body aches, flu exposure and  household contact, I believe it is reasonable to go ahead and treat her for influenza with Tamiflu. -Our clinic is out of point-of-care flu testing so a send out flu test has been requested today.    I discussed the assessment and treatment plan with the patient. The patient was provided an opportunity to ask questions and all were answered. The patient agreed with the plan and demonstrated an understanding of the instructions.   The patient was advised to call back or seek an in-person evaluation if the symptoms worsen or if the condition fails to improve as anticipated.  I provided 22 minutes of non-face-to-face time during this encounter.   Lelon Frohlich, MD [facility]

## 2021-01-25 ENCOUNTER — Telehealth: Payer: Self-pay | Admitting: Internal Medicine

## 2021-01-25 NOTE — Telephone Encounter (Signed)
Pt is calling and would like results of flu test and to report she is still wheezing from 01-23-2021. Please advise

## 2021-01-25 NOTE — Telephone Encounter (Signed)
Patient is aware.  States she is "feeling better".

## 2021-01-27 LAB — INFLUENZA A AND B AG, IMMUNOASSAY
INFLUENZA A ANTIGEN: NOT DETECTED
INFLUENZA B ANTIGEN: NOT DETECTED
MICRO NUMBER:: 12726180
SPECIMEN QUALITY:: ADEQUATE
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
## 2021-02-05 ENCOUNTER — Encounter: Payer: Self-pay | Admitting: Internal Medicine

## 2021-02-05 ENCOUNTER — Inpatient Hospital Stay: Payer: Medicaid Other | Attending: Oncology

## 2021-02-05 ENCOUNTER — Telehealth: Payer: Self-pay

## 2021-02-05 ENCOUNTER — Other Ambulatory Visit: Payer: Self-pay

## 2021-02-05 ENCOUNTER — Inpatient Hospital Stay (HOSPITAL_BASED_OUTPATIENT_CLINIC_OR_DEPARTMENT_OTHER): Payer: Medicaid Other | Admitting: Oncology

## 2021-02-05 ENCOUNTER — Ambulatory Visit (INDEPENDENT_AMBULATORY_CARE_PROVIDER_SITE_OTHER): Payer: Medicaid Other | Admitting: Internal Medicine

## 2021-02-05 VITALS — BP 138/90 | HR 89 | Temp 98.2°F | Ht 65.0 in | Wt 249.0 lb

## 2021-02-05 VITALS — BP 161/63 | HR 98 | Temp 97.9°F | Resp 16 | Ht 65.0 in | Wt 250.4 lb

## 2021-02-05 DIAGNOSIS — I158 Other secondary hypertension: Secondary | ICD-10-CM

## 2021-02-05 DIAGNOSIS — Z0000 Encounter for general adult medical examination without abnormal findings: Secondary | ICD-10-CM | POA: Diagnosis not present

## 2021-02-05 DIAGNOSIS — G4733 Obstructive sleep apnea (adult) (pediatric): Secondary | ICD-10-CM

## 2021-02-05 DIAGNOSIS — C50412 Malignant neoplasm of upper-outer quadrant of left female breast: Secondary | ICD-10-CM | POA: Diagnosis not present

## 2021-02-05 DIAGNOSIS — I10 Essential (primary) hypertension: Secondary | ICD-10-CM | POA: Diagnosis not present

## 2021-02-05 DIAGNOSIS — Z170 Estrogen receptor positive status [ER+]: Secondary | ICD-10-CM | POA: Insufficient documentation

## 2021-02-05 DIAGNOSIS — Z79899 Other long term (current) drug therapy: Secondary | ICD-10-CM | POA: Insufficient documentation

## 2021-02-05 DIAGNOSIS — Z79811 Long term (current) use of aromatase inhibitors: Secondary | ICD-10-CM | POA: Insufficient documentation

## 2021-02-05 DIAGNOSIS — H9190 Unspecified hearing loss, unspecified ear: Secondary | ICD-10-CM

## 2021-02-05 DIAGNOSIS — H539 Unspecified visual disturbance: Secondary | ICD-10-CM | POA: Diagnosis not present

## 2021-02-05 DIAGNOSIS — E559 Vitamin D deficiency, unspecified: Secondary | ICD-10-CM | POA: Diagnosis not present

## 2021-02-05 LAB — CMP (CANCER CENTER ONLY)
ALT: 19 U/L (ref 0–44)
AST: 17 U/L (ref 15–41)
Albumin: 3.9 g/dL (ref 3.5–5.0)
Alkaline Phosphatase: 117 U/L (ref 38–126)
Anion gap: 7 (ref 5–15)
BUN: 16 mg/dL (ref 6–20)
CO2: 28 mmol/L (ref 22–32)
Calcium: 9.2 mg/dL (ref 8.9–10.3)
Chloride: 107 mmol/L (ref 98–111)
Creatinine: 0.92 mg/dL (ref 0.44–1.00)
GFR, Estimated: >60 mL/min (ref >60)
Glucose, Bld: 145 mg/dL — ABNORMAL HIGH (ref 70–99)
Potassium: 3.6 mmol/L (ref 3.5–5.1)
Sodium: 142 mmol/L (ref 135–145)
Total Bilirubin: 0.4 mg/dL (ref 0.3–1.2)
Total Protein: 7.3 g/dL (ref 6.5–8.1)

## 2021-02-05 LAB — CBC WITH DIFFERENTIAL (CANCER CENTER ONLY)
Abs Immature Granulocytes: 0.01 10*3/uL (ref 0.00–0.07)
Basophils Absolute: 0 10*3/uL (ref 0.0–0.1)
Basophils Relative: 1 %
Eosinophils Absolute: 0.1 10*3/uL (ref 0.0–0.5)
Eosinophils Relative: 1 %
HCT: 40.3 % (ref 36.0–46.0)
Hemoglobin: 13.1 g/dL (ref 12.0–15.0)
Immature Granulocytes: 0 %
Lymphocytes Relative: 39 %
Lymphs Abs: 2.4 10*3/uL (ref 0.7–4.0)
MCH: 28.7 pg (ref 26.0–34.0)
MCHC: 32.5 g/dL (ref 30.0–36.0)
MCV: 88.2 fL (ref 80.0–100.0)
Monocytes Absolute: 0.6 10*3/uL (ref 0.1–1.0)
Monocytes Relative: 10 %
Neutro Abs: 3.1 10*3/uL (ref 1.7–7.7)
Neutrophils Relative %: 49 %
Platelet Count: 231 10*3/uL (ref 150–400)
RBC: 4.57 MIL/uL (ref 3.87–5.11)
RDW: 14.2 % (ref 11.5–15.5)
WBC Count: 6.3 10*3/uL (ref 4.0–10.5)
nRBC: 0 % (ref 0.0–0.2)

## 2021-02-05 LAB — VITAMIN B12: Vitamin B-12: 167 pg/mL — ABNORMAL LOW (ref 211–911)

## 2021-02-05 LAB — COMPREHENSIVE METABOLIC PANEL
ALT: 20 U/L (ref 0–35)
AST: 18 U/L (ref 0–37)
Albumin: 4 g/dL (ref 3.5–5.2)
Alkaline Phosphatase: 114 U/L (ref 39–117)
BUN: 11 mg/dL (ref 6–23)
CO2: 30 mEq/L (ref 19–32)
Calcium: 9.5 mg/dL (ref 8.4–10.5)
Chloride: 102 mEq/L (ref 96–112)
Creatinine, Ser: 0.82 mg/dL (ref 0.40–1.20)
GFR: 77.96 mL/min (ref >60.00)
Glucose, Bld: 111 mg/dL — ABNORMAL HIGH (ref 70–99)
Potassium: 3.6 mEq/L (ref 3.5–5.1)
Sodium: 140 mEq/L (ref 135–145)
Total Bilirubin: 0.7 mg/dL (ref 0.2–1.2)
Total Protein: 7.7 g/dL (ref 6.0–8.3)

## 2021-02-05 LAB — CBC WITH DIFFERENTIAL/PLATELET
Basophils Absolute: 0 10*3/uL (ref 0.0–0.1)
Basophils Relative: 0.7 % (ref 0.0–3.0)
Eosinophils Absolute: 0.1 10*3/uL (ref 0.0–0.7)
Eosinophils Relative: 1.2 % (ref 0.0–5.0)
HCT: 41.4 % (ref 36.0–46.0)
Hemoglobin: 13.3 g/dL (ref 12.0–15.0)
Lymphocytes Relative: 37.7 % (ref 12.0–46.0)
Lymphs Abs: 1.9 10*3/uL (ref 0.7–4.0)
MCHC: 32.2 g/dL (ref 30.0–36.0)
MCV: 88.5 fl (ref 78.0–100.0)
Monocytes Absolute: 0.6 10*3/uL (ref 0.1–1.0)
Monocytes Relative: 11.7 % (ref 3.0–12.0)
Neutro Abs: 2.4 10*3/uL (ref 1.4–7.7)
Neutrophils Relative %: 48.7 % (ref 43.0–77.0)
Platelets: 230 10*3/uL (ref 150.0–400.0)
RBC: 4.67 Mil/uL (ref 3.87–5.11)
RDW: 14.9 % (ref 11.5–15.5)
WBC: 5 10*3/uL (ref 4.0–10.5)

## 2021-02-05 LAB — TSH: TSH: 3.14 u[IU]/mL (ref 0.35–5.50)

## 2021-02-05 LAB — LIPID PANEL
Cholesterol: 251 mg/dL — ABNORMAL HIGH (ref 0–200)
HDL: 39.5 mg/dL (ref >39.00)
LDL Cholesterol: 177 mg/dL — ABNORMAL HIGH (ref 0–99)
NonHDL: 211.18
Total CHOL/HDL Ratio: 6
Triglycerides: 172 mg/dL — ABNORMAL HIGH (ref 0.0–149.0)
VLDL: 34.4 mg/dL (ref 0.0–40.0)

## 2021-02-05 LAB — VITAMIN D 25 HYDROXY (VIT D DEFICIENCY, FRACTURES): VITD: 12.97 ng/mL — ABNORMAL LOW (ref 30.00–100.00)

## 2021-02-05 LAB — HEMOGLOBIN A1C: Hgb A1c MFr Bld: 6.6 % — ABNORMAL HIGH (ref 4.6–6.5)

## 2021-02-05 MED ORDER — AMLODIPINE BESYLATE 10 MG PO TABS: 10.0000 mg | ORAL_TABLET | Freq: Every day | ORAL | 1 refills | Status: DC

## 2021-02-05 MED ORDER — GABAPENTIN 100 MG PO CAPS: 300.0000 mg | ORAL_CAPSULE | Freq: Every day | ORAL | 4 refills | Status: DC

## 2021-02-05 MED ORDER — HYDROCHLOROTHIAZIDE 25 MG PO TABS: 25.0000 mg | ORAL_TABLET | Freq: Every day | ORAL | 1 refills | Status: DC

## 2021-02-05 MED ORDER — VENLAFAXINE HCL ER 150 MG PO CP24: 150.0000 mg | ORAL_CAPSULE | Freq: Every day | ORAL | 4 refills | Status: DC

## 2021-02-05 MED ORDER — ANASTROZOLE 1 MG PO TABS: 1.0000 mg | ORAL_TABLET | Freq: Every day | ORAL | 4 refills | Status: DC

## 2021-02-05 NOTE — Patient Instructions (Signed)
-  Nice seeing you today!!  -Lab work today; will notify you once results are available.  -Consider flu, COVID and shingles vaccines.  -Make sure you are taking both BP meds and schedule follow up in 6 weeks.
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
## 2021-02-05 NOTE — Telephone Encounter (Signed)
Pt called 12/19 at 1615 stating she will need transportation for her appt with MD 12/20. I called Darrick Meigs with transportation services who states he will call pt to set up. Followed up with pt this morning to see if transportation was worked out. Pt states she did not receive a call from transportation and she is familiar with Darrick Meigs and will call him.

## 2021-02-06 ENCOUNTER — Telehealth: Payer: Self-pay | Admitting: *Deleted

## 2021-02-06 ENCOUNTER — Ambulatory Visit: Payer: Medicaid Other | Attending: Internal Medicine | Admitting: Audiologist

## 2021-02-06 DIAGNOSIS — R293 Abnormal posture: Secondary | ICD-10-CM | POA: Insufficient documentation

## 2021-02-06 DIAGNOSIS — H9193 Unspecified hearing loss, bilateral: Secondary | ICD-10-CM | POA: Insufficient documentation

## 2021-02-06 DIAGNOSIS — M6281 Muscle weakness (generalized): Secondary | ICD-10-CM | POA: Insufficient documentation

## 2021-02-06 DIAGNOSIS — N950 Postmenopausal bleeding: Secondary | ICD-10-CM

## 2021-02-06 DIAGNOSIS — R2230 Localized swelling, mass and lump, unspecified upper limb: Secondary | ICD-10-CM | POA: Insufficient documentation

## 2021-02-06 DIAGNOSIS — M25512 Pain in left shoulder: Secondary | ICD-10-CM | POA: Insufficient documentation

## 2021-02-06 DIAGNOSIS — Z483 Aftercare following surgery for neoplasm: Secondary | ICD-10-CM | POA: Insufficient documentation

## 2021-02-06 NOTE — Telephone Encounter (Signed)
Patient said she would like to have pelvic ultrasound, order placed. Message sent to appointments to schedule.

## 2021-02-06 NOTE — Telephone Encounter (Signed)
-----   Message from Tamela Gammon, NP sent at 02/06/2021  7:56 AM EST ----- This patient was seen by oncology yesterday and reported postmenopausal bleeding. She has not had any bleeding for about 6 years. Please call her and ask that she schedule an appointment to discuss or we can go ahead and get her scheduled for an ultrasound for evaluation, whichever she prefers. Thank you.

## 2021-02-06 NOTE — Procedures (Signed)
°  Outpatient Audiology and [place], [place]  [address] [address]  EVALUATION  NAME: Lauren Garrison     DOB:   [birth year]      MRN: [id number]                                                                                     DATE: 02/06/2021     REFERENT: Isaac Bliss, Rayford Halsted, MD STATUS: Outpatient DIAGNOSIS: Decreased Hearing Bilateral     History: Lauren Garrison was seen for an audiological evaluation. Lauren Garrison is receiving a hearing evaluation due to concerns for increased difficulty hearing. Lauren Garrison had a hearing test in 2010 but has not had it tested since. She would like her hearing evaluated again to check for changes. She has intermittent popping and pressure in her ears.  No pain or pressure reported in either ear today. Tinnitus present in both ears intermittently. Lauren Garrison has no significant history of noise exposure. Deysi had radiation and chemotherapy for breat cancer. She is currently in remission. Her last dose of chemotherapy was last May.   Medical history positive for treatment with chemotherapy which is a risk factor for hearing loss. No other relevant case history reported.    Evaluation:  Otoscopy showed a clear view of the tympanic membranes, bilaterally Tympanometry results were consistent with normal middle ear pressure, bilaterally   Audiometric testing was completed using conventional audiometry with insert transducer. Speech Recognition Thresholds were consistent with pure tone averages. Word Recognition was excellent at conversation level. Pure tone thresholds show normal to slight sensorineural hearing loss in both ears. Test results are consistent with very slight hearing loss in each ear   Results:  The test results were reviewed with [facility]. Her hearing is slightly decreased in both ears. This is not a degree of loss that would interfere with her ability to hear on a basis. Since it  has been over six months since her last dose of chemotherapy there are no indications that it has caused her any significant hearing loss.   Recommendations: 1.   No further audiologic testing is needed unless future hearing concerns arise. Recommend starting to monitor hearing annually after age 18.    Alfonse Alpers  Audiologist, Au.D., CCC-A 02/06/2021  4:26 PM  Cc: Isaac Bliss, Rayford Halsted, MD

## 2021-02-06 NOTE — Telephone Encounter (Signed)
Patient scheduled on 02/26/21

## 2021-02-07 ENCOUNTER — Other Ambulatory Visit: Payer: Self-pay | Admitting: Internal Medicine

## 2021-02-07 ENCOUNTER — Encounter: Payer: Self-pay | Admitting: Internal Medicine

## 2021-02-07 DIAGNOSIS — E119 Type 2 diabetes mellitus without complications: Secondary | ICD-10-CM | POA: Insufficient documentation

## 2021-02-07 DIAGNOSIS — E785 Hyperlipidemia, unspecified: Secondary | ICD-10-CM

## 2021-02-07 DIAGNOSIS — E559 Vitamin D deficiency, unspecified: Secondary | ICD-10-CM

## 2021-02-07 DIAGNOSIS — E1169 Type 2 diabetes mellitus with other specified complication: Secondary | ICD-10-CM

## 2021-02-07 DIAGNOSIS — E538 Deficiency of other specified B group vitamins: Secondary | ICD-10-CM | POA: Insufficient documentation

## 2021-02-07 MED ORDER — CYANOCOBALAMIN 1000 MCG/ML IJ SOLN: INTRAMUSCULAR | 0 refills | Status: DC

## 2021-02-07 MED ORDER — "BD SAFETYGLIDE SYRINGE/NEEDLE 25G X 1"" 3 ML MISC": 11 refills | Status: DC

## 2021-02-07 MED ORDER — VITAMIN D (ERGOCALCIFEROL) 1.25 MG (50000 UNIT) PO CAPS: 50000.0000 [IU] | ORAL_CAPSULE | ORAL | 0 refills | Status: AC

## 2021-02-07 MED ORDER — METFORMIN HCL 500 MG PO TABS: 500.0000 mg | ORAL_TABLET | Freq: Every day | ORAL | 1 refills | Status: DC

## 2021-02-07 MED ORDER — ATORVASTATIN CALCIUM 40 MG PO TABS: 40.0000 mg | ORAL_TABLET | Freq: Every day | ORAL | 1 refills | Status: DC

## 2021-02-07 NOTE — Progress Notes (Signed)
Some significant issues here:  1. Vit D def: 50000 IU weekly x 12 weeks with follow up levels then. Will send Rx.  2. Vit B12 deficiency: IM weekly x 4 then monthly. In office or send supplies?  3. A1c is 6.6 which makes her a diabetic. Start Lifestyle modifications: healthy eating, weight loss, increased physical activity. Start metformin 500 mg at bedtime. Please schedule OV in 3-4 months.  4. Cholesterol is too high for a diabetic. Start atorvastatin 40 mg at bedtime in addition to lifestyle changes as above. Recheck lipids when she returns in 3 months.

## 2021-02-12 ENCOUNTER — Telehealth: Payer: Self-pay | Admitting: Oncology

## 2021-02-12 NOTE — Telephone Encounter (Signed)
Scheduled appointment per 12/20 los. Left message.

## 2021-02-14 ENCOUNTER — Ambulatory Visit: Payer: Medicaid Other

## 2021-02-14 ENCOUNTER — Encounter: Payer: Self-pay | Admitting: Adult Health

## 2021-02-14 ENCOUNTER — Ambulatory Visit (INDEPENDENT_AMBULATORY_CARE_PROVIDER_SITE_OTHER): Payer: Medicaid Other | Admitting: Adult Health

## 2021-02-14 ENCOUNTER — Other Ambulatory Visit: Payer: Self-pay

## 2021-02-14 ENCOUNTER — Telehealth: Payer: Self-pay

## 2021-02-14 ENCOUNTER — Ambulatory Visit (INDEPENDENT_AMBULATORY_CARE_PROVIDER_SITE_OTHER): Payer: Medicaid Other

## 2021-02-14 ENCOUNTER — Other Ambulatory Visit: Payer: Self-pay | Admitting: Internal Medicine

## 2021-02-14 VITALS — BP 160/82 | HR 92 | Temp 98.7°F | Ht 65.0 in | Wt 259.0 lb

## 2021-02-14 DIAGNOSIS — M25572 Pain in left ankle and joints of left foot: Secondary | ICD-10-CM

## 2021-02-14 DIAGNOSIS — M79642 Pain in left hand: Secondary | ICD-10-CM

## 2021-02-14 DIAGNOSIS — M79641 Pain in right hand: Secondary | ICD-10-CM
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
## 2021-02-14 NOTE — Telephone Encounter (Signed)
Pt called into the office to state when she picked her prescription up from the pharmacy she only received 1 dose cyanocobalamin (,VITAMIN B-12,) 1000 MCG/ML injection     of the patient stated the pharmacy told her she would need another prescription written to receive the remaining doses

## 2021-02-15 ENCOUNTER — Other Ambulatory Visit: Payer: Self-pay

## 2021-02-15 ENCOUNTER — Encounter: Payer: Self-pay | Admitting: Oncology

## 2021-02-15 ENCOUNTER — Ambulatory Visit: Payer: Medicaid Other

## 2021-02-15 ENCOUNTER — Other Ambulatory Visit: Payer: Self-pay | Admitting: *Deleted

## 2021-02-15 DIAGNOSIS — M25512 Pain in left shoulder: Secondary | ICD-10-CM

## 2021-02-15 DIAGNOSIS — Z170 Estrogen receptor positive status [ER+]: Secondary | ICD-10-CM

## 2021-02-15 DIAGNOSIS — R293 Abnormal posture: Secondary | ICD-10-CM

## 2021-02-15 DIAGNOSIS — Z483 Aftercare following surgery for neoplasm: Secondary | ICD-10-CM

## 2021-02-15 DIAGNOSIS — R2230 Localized swelling, mass and lump, unspecified upper limb: Secondary | ICD-10-CM

## 2021-02-15 DIAGNOSIS — M6281 Muscle weakness (generalized): Secondary | ICD-10-CM

## 2021-02-15 DIAGNOSIS — H9193 Unspecified hearing loss, bilateral: Secondary | ICD-10-CM | POA: Diagnosis not present
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
## 2021-02-15 NOTE — Telephone Encounter (Signed)
Rx has been refilled.  

## 2021-02-17 DIAGNOSIS — G4733 Obstructive sleep apnea (adult) (pediatric): Secondary | ICD-10-CM | POA: Diagnosis not present

## 2021-02-19 ENCOUNTER — Other Ambulatory Visit: Payer: Self-pay

## 2021-02-19 ENCOUNTER — Ambulatory Visit: Payer: Medicaid Other | Attending: Hematology and Oncology

## 2021-02-19 ENCOUNTER — Encounter: Payer: Self-pay | Admitting: Oncology

## 2021-02-19 DIAGNOSIS — M6281 Muscle weakness (generalized): Secondary | ICD-10-CM | POA: Insufficient documentation

## 2021-02-19 DIAGNOSIS — M25512 Pain in left shoulder: Secondary | ICD-10-CM | POA: Insufficient documentation

## 2021-02-19 DIAGNOSIS — Z483 Aftercare following surgery for neoplasm: Secondary | ICD-10-CM | POA: Diagnosis present

## 2021-02-19 DIAGNOSIS — M25611 Stiffness of right shoulder, not elsewhere classified: Secondary | ICD-10-CM | POA: Diagnosis present

## 2021-02-19 DIAGNOSIS — R293 Abnormal posture: Secondary | ICD-10-CM | POA: Insufficient documentation

## 2021-02-19 DIAGNOSIS — R2230 Localized swelling, mass and lump, unspecified upper limb: Secondary | ICD-10-CM | POA: Insufficient documentation
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
## 2021-02-21 DIAGNOSIS — G4733 Obstructive sleep apnea (adult) (pediatric): Secondary | ICD-10-CM | POA: Diagnosis not present

## 2021-02-25 ENCOUNTER — Encounter: Payer: Self-pay | Admitting: Physical Therapy

## 2021-02-25 ENCOUNTER — Other Ambulatory Visit: Payer: Self-pay

## 2021-02-25 ENCOUNTER — Ambulatory Visit: Payer: Medicaid Other | Admitting: Physical Therapy

## 2021-02-25 DIAGNOSIS — Z483 Aftercare following surgery for neoplasm: Secondary | ICD-10-CM

## 2021-02-25 DIAGNOSIS — M6281 Muscle weakness (generalized): Secondary | ICD-10-CM

## 2021-02-25 DIAGNOSIS — M25611 Stiffness of right shoulder, not elsewhere classified: Secondary | ICD-10-CM

## 2021-02-25 DIAGNOSIS — R2230 Localized swelling, mass and lump, unspecified upper limb: Secondary | ICD-10-CM

## 2021-02-25 DIAGNOSIS — R293 Abnormal posture: Secondary | ICD-10-CM

## 2021-02-25 DIAGNOSIS — M25512 Pain in left shoulder: Secondary | ICD-10-CM
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
## 2021-02-26 ENCOUNTER — Other Ambulatory Visit (HOSPITAL_COMMUNITY)
Admission: RE | Admit: 2021-02-26 | Discharge: 2021-02-26 | Disposition: A | Discharge status: Home / Self Care | Payer: Medicaid Other | Source: Ambulatory Visit | Attending: Obstetrics and Gynecology | Admitting: Obstetrics and Gynecology

## 2021-02-26 ENCOUNTER — Ambulatory Visit (INDEPENDENT_AMBULATORY_CARE_PROVIDER_SITE_OTHER): Payer: Medicaid Other

## 2021-02-26 ENCOUNTER — Ambulatory Visit: Payer: Medicaid Other | Admitting: Obstetrics and Gynecology

## 2021-02-26 VITALS — BP 140/80

## 2021-02-26 DIAGNOSIS — Z8639 Personal history of other endocrine, nutritional and metabolic disease: Secondary | ICD-10-CM | POA: Diagnosis not present

## 2021-02-26 DIAGNOSIS — N950 Postmenopausal bleeding: Secondary | ICD-10-CM | POA: Diagnosis not present

## 2021-02-26 DIAGNOSIS — I10 Essential (primary) hypertension: Secondary | ICD-10-CM

## 2021-02-26 DIAGNOSIS — Z853 Personal history of malignant neoplasm of breast: Secondary | ICD-10-CM

## 2021-02-26 DIAGNOSIS — R9389 Abnormal findings on diagnostic imaging of other specified body structures: Secondary | ICD-10-CM | POA: Insufficient documentation
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
## 2021-02-26 NOTE — Patient Instructions (Signed)

## 2021-02-27 ENCOUNTER — Encounter: Payer: Self-pay | Admitting: Physical Therapy

## 2021-02-27 ENCOUNTER — Ambulatory Visit: Payer: Medicaid Other | Admitting: Physical Therapy

## 2021-02-27 ENCOUNTER — Other Ambulatory Visit: Payer: Self-pay

## 2021-02-27 DIAGNOSIS — Z483 Aftercare following surgery for neoplasm: Secondary | ICD-10-CM

## 2021-02-27 DIAGNOSIS — R2230 Localized swelling, mass and lump, unspecified upper limb: Secondary | ICD-10-CM

## 2021-02-27 DIAGNOSIS — M25512 Pain in left shoulder: Secondary | ICD-10-CM

## 2021-02-27 DIAGNOSIS — R293 Abnormal posture: Secondary | ICD-10-CM
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
## 2021-02-28 ENCOUNTER — Other Ambulatory Visit: Payer: Self-pay | Admitting: Hematology and Oncology

## 2021-02-28 DIAGNOSIS — C50412 Malignant neoplasm of upper-outer quadrant of left female breast: Secondary | ICD-10-CM

## 2021-02-28 LAB — SURGICAL PATHOLOGY

## 2021-03-01 ENCOUNTER — Telehealth: Payer: Self-pay | Admitting: Internal Medicine

## 2021-03-01 NOTE — Telephone Encounter (Signed)
Patient called because she has received her prescription for cyanocobalamin (,VITAMIN B-12,) 1000 MCG/ML injection and was given four syringes, but only enough in the vial for one injection.     Please send correct refill and amount to    [id number] - [place], [place] Phone:  [phone]  Fax:  [phone]       Please advise

## 2021-03-04 ENCOUNTER — Encounter: Payer: Self-pay | Admitting: Obstetrics and Gynecology

## 2021-03-04 ENCOUNTER — Telehealth: Payer: Self-pay | Admitting: Obstetrics and Gynecology

## 2021-03-04 ENCOUNTER — Other Ambulatory Visit: Payer: Self-pay

## 2021-03-04 ENCOUNTER — Telehealth: Payer: Self-pay | Admitting: *Deleted

## 2021-03-04 ENCOUNTER — Ambulatory Visit: Payer: Medicaid Other

## 2021-03-04 DIAGNOSIS — R293 Abnormal posture: Secondary | ICD-10-CM

## 2021-03-04 DIAGNOSIS — M25512 Pain in left shoulder: Secondary | ICD-10-CM

## 2021-03-04 DIAGNOSIS — R2230 Localized swelling, mass and lump, unspecified upper limb: Secondary | ICD-10-CM

## 2021-03-04 DIAGNOSIS — Z483 Aftercare following surgery for neoplasm: Secondary | ICD-10-CM

## 2021-03-04 NOTE — Telephone Encounter (Signed)
Patient informed. Encounter forwarded to Glorianne Manchester to proceed.
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
## 2021-03-04 NOTE — Telephone Encounter (Signed)
Surgery: CPT [phone] - Hysteroscopy/D&C/Myosure,   Diagnosis: R93.89 Thickened Endometrium, N95.0 Postmenopausal Bleeding,   Location: [facility]  Status: Outpatient  Time: 10 Minutes  Assistant: N/A  Urgency: At Patient's Convenience  Pre-Op Appointment: Completed  Post-Op Appointment(s): 1 Week,   Time Out Of Work: Day Of Surgery, 1 Day Post Op

## 2021-03-04 NOTE — Telephone Encounter (Signed)
-----   Message from Lauren Dom, MD sent at 03/04/2021  9:00 AM EST ----- Please let the patient know that her endometrial biopsy is benign, but doesn't explain her thickened endometrium. I will go ahead and get the hysteroscopy scheduled

## 2021-03-05 ENCOUNTER — Other Ambulatory Visit: Payer: Self-pay

## 2021-03-05 MED ORDER — CYANOCOBALAMIN 1000 MCG/ML IJ SOLN: INTRAMUSCULAR | 1 refills | Status: DC

## 2021-03-05 NOTE — Telephone Encounter (Signed)
Rx resent. Pt aware.

## 2021-03-05 NOTE — Telephone Encounter (Signed)
See second telephone encounter dated 03/04/21.   Encounter closed.

## 2021-03-06 ENCOUNTER — Encounter: Payer: Self-pay | Admitting: Physical Therapy

## 2021-03-06 ENCOUNTER — Other Ambulatory Visit: Payer: Self-pay

## 2021-03-06 ENCOUNTER — Ambulatory Visit: Payer: Medicaid Other | Admitting: Physical Therapy

## 2021-03-06 DIAGNOSIS — Z483 Aftercare following surgery for neoplasm: Secondary | ICD-10-CM | POA: Diagnosis not present

## 2021-03-06 DIAGNOSIS — R293 Abnormal posture: Secondary | ICD-10-CM

## 2021-03-06 DIAGNOSIS — M25512 Pain in left shoulder: Secondary | ICD-10-CM

## 2021-03-06 DIAGNOSIS — R2230 Localized swelling, mass and lump, unspecified upper limb: Secondary | ICD-10-CM
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
## 2021-03-07 ENCOUNTER — Ambulatory Visit
Admission: RE | Admit: 2021-03-07 | Discharge: 2021-03-07 | Disposition: A | Discharge status: Home / Self Care | Payer: Medicaid Other | Source: Ambulatory Visit | Attending: Adult Health | Admitting: Adult Health

## 2021-03-07 ENCOUNTER — Other Ambulatory Visit: Payer: Self-pay | Admitting: Hematology and Oncology

## 2021-03-07 DIAGNOSIS — Z1231 Encounter for screening mammogram for malignant neoplasm of breast: Secondary | ICD-10-CM | POA: Diagnosis not present

## 2021-03-07 DIAGNOSIS — C50412 Malignant neoplasm of upper-outer quadrant of left female breast: Secondary | ICD-10-CM

## 2021-03-07 DIAGNOSIS — Z170 Estrogen receptor positive status [ER+]: Secondary | ICD-10-CM

## 2021-03-07 HISTORY — DX: Personal history of antineoplastic chemotherapy: Z92.21

## 2021-03-08 NOTE — Telephone Encounter (Signed)
Call to patient x3. Number would not go through, no alternative number.   MyChart message to patient.

## 2021-03-11 ENCOUNTER — Encounter: Payer: Self-pay | Admitting: Physical Therapy

## 2021-03-11 ENCOUNTER — Other Ambulatory Visit: Payer: Self-pay

## 2021-03-11 ENCOUNTER — Ambulatory Visit: Payer: Medicaid Other | Admitting: Physical Therapy

## 2021-03-11 DIAGNOSIS — R2230 Localized swelling, mass and lump, unspecified upper limb: Secondary | ICD-10-CM

## 2021-03-11 DIAGNOSIS — Z483 Aftercare following surgery for neoplasm: Secondary | ICD-10-CM | POA: Diagnosis not present

## 2021-03-11 DIAGNOSIS — M25512 Pain in left shoulder: Secondary | ICD-10-CM

## 2021-03-11 DIAGNOSIS — R293 Abnormal posture: Secondary | ICD-10-CM
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
## 2021-03-13 DIAGNOSIS — M65311 Trigger thumb, right thumb: Secondary | ICD-10-CM | POA: Diagnosis not present

## 2021-03-13 DIAGNOSIS — G5603 Carpal tunnel syndrome, bilateral upper limbs: Secondary | ICD-10-CM | POA: Diagnosis not present

## 2021-03-14 ENCOUNTER — Inpatient Hospital Stay: Payer: Medicaid Other | Attending: Oncology | Admitting: Hematology and Oncology

## 2021-03-14 ENCOUNTER — Other Ambulatory Visit: Payer: Self-pay

## 2021-03-14 ENCOUNTER — Encounter: Payer: Self-pay | Admitting: Hematology and Oncology

## 2021-03-14 VITALS — BP 160/84 | HR 102 | Temp 97.9°F | Resp 18 | Ht 65.0 in | Wt 252.2 lb

## 2021-03-14 DIAGNOSIS — Z79811 Long term (current) use of aromatase inhibitors: Secondary | ICD-10-CM | POA: Insufficient documentation

## 2021-03-14 DIAGNOSIS — N939 Abnormal uterine and vaginal bleeding, unspecified: Secondary | ICD-10-CM | POA: Diagnosis not present

## 2021-03-14 DIAGNOSIS — Z170 Estrogen receptor positive status [ER+]: Secondary | ICD-10-CM | POA: Insufficient documentation

## 2021-03-14 DIAGNOSIS — G620 Drug-induced polyneuropathy: Secondary | ICD-10-CM | POA: Diagnosis not present

## 2021-03-14 DIAGNOSIS — T451X5A Adverse effect of antineoplastic and immunosuppressive drugs, initial encounter: Secondary | ICD-10-CM

## 2021-03-14 DIAGNOSIS — Z79899 Other long term (current) drug therapy: Secondary | ICD-10-CM | POA: Insufficient documentation

## 2021-03-14 DIAGNOSIS — C50412 Malignant neoplasm of upper-outer quadrant of left female breast: Secondary | ICD-10-CM | POA: Diagnosis not present
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
## 2021-03-14 NOTE — Telephone Encounter (Signed)
Dr. Talbert Nan -See MyChart message from patient.

## 2021-03-18 ENCOUNTER — Other Ambulatory Visit: Payer: Self-pay

## 2021-03-18 ENCOUNTER — Ambulatory Visit: Payer: Medicaid Other

## 2021-03-18 DIAGNOSIS — R2230 Localized swelling, mass and lump, unspecified upper limb: Secondary | ICD-10-CM

## 2021-03-18 DIAGNOSIS — R293 Abnormal posture: Secondary | ICD-10-CM

## 2021-03-18 DIAGNOSIS — M25512 Pain in left shoulder: Secondary | ICD-10-CM

## 2021-03-18 DIAGNOSIS — Z483 Aftercare following surgery for neoplasm: Secondary | ICD-10-CM
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
## 2021-03-20 ENCOUNTER — Encounter: Payer: Self-pay | Admitting: Physical Therapy

## 2021-03-20 ENCOUNTER — Ambulatory Visit (INDEPENDENT_AMBULATORY_CARE_PROVIDER_SITE_OTHER): Payer: Medicaid Other | Admitting: Internal Medicine

## 2021-03-20 ENCOUNTER — Other Ambulatory Visit: Payer: Self-pay

## 2021-03-20 ENCOUNTER — Ambulatory Visit: Payer: Medicaid Other | Attending: Hematology and Oncology | Admitting: Physical Therapy

## 2021-03-20 VITALS — BP 120/79 | HR 95 | Temp 98.6°F | Resp 18 | Ht 64.96 in | Wt 244.6 lb

## 2021-03-20 DIAGNOSIS — G4733 Obstructive sleep apnea (adult) (pediatric): Secondary | ICD-10-CM | POA: Diagnosis not present

## 2021-03-20 DIAGNOSIS — E1169 Type 2 diabetes mellitus with other specified complication: Secondary | ICD-10-CM

## 2021-03-20 DIAGNOSIS — I10 Essential (primary) hypertension: Secondary | ICD-10-CM | POA: Diagnosis not present

## 2021-03-20 DIAGNOSIS — E559 Vitamin D deficiency, unspecified: Secondary | ICD-10-CM | POA: Diagnosis not present

## 2021-03-20 DIAGNOSIS — M6281 Muscle weakness (generalized): Secondary | ICD-10-CM | POA: Diagnosis present

## 2021-03-20 DIAGNOSIS — R293 Abnormal posture: Secondary | ICD-10-CM | POA: Insufficient documentation

## 2021-03-20 DIAGNOSIS — Z170 Estrogen receptor positive status [ER+]: Secondary | ICD-10-CM | POA: Diagnosis not present

## 2021-03-20 DIAGNOSIS — R2230 Localized swelling, mass and lump, unspecified upper limb: Secondary | ICD-10-CM | POA: Insufficient documentation

## 2021-03-20 DIAGNOSIS — Z483 Aftercare following surgery for neoplasm: Secondary | ICD-10-CM | POA: Insufficient documentation

## 2021-03-20 DIAGNOSIS — M25611 Stiffness of right shoulder, not elsewhere classified: Secondary | ICD-10-CM | POA: Insufficient documentation

## 2021-03-20 DIAGNOSIS — M25512 Pain in left shoulder: Secondary | ICD-10-CM | POA: Insufficient documentation

## 2021-03-20 DIAGNOSIS — C50412 Malignant neoplasm of upper-outer quadrant of left female breast: Secondary | ICD-10-CM | POA: Diagnosis not present

## 2021-03-20 DIAGNOSIS — E785 Hyperlipidemia, unspecified: Secondary | ICD-10-CM

## 2021-03-20 DIAGNOSIS — E538 Deficiency of other specified B group vitamins: Secondary | ICD-10-CM

## 2021-03-20 MED ORDER — AMLODIPINE BESYLATE 10 MG PO TABS: 10.0000 mg | ORAL_TABLET | Freq: Every day | ORAL | 1 refills | Status: DC

## 2021-03-20 MED ORDER — ATORVASTATIN CALCIUM 40 MG PO TABS: 40.0000 mg | ORAL_TABLET | Freq: Every day | ORAL | 1 refills | Status: DC

## 2021-03-20 MED ORDER — VALSARTAN 80 MG PO TABS: 80.0000 mg | ORAL_TABLET | Freq: Every day | ORAL | 3 refills | Status: DC

## 2021-03-20 MED ORDER — METFORMIN HCL 500 MG PO TABS: 500.0000 mg | ORAL_TABLET | Freq: Every day | ORAL | 1 refills | Status: DC
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
## 2021-03-20 NOTE — Progress Notes (Signed)
Pt presents for hypertension follow-up  ?
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
## 2021-03-20 NOTE — Patient Instructions (Signed)
-  Nice seeing you today!!  -Stop taking HCTZ.  -Start valsartan 80 mg daily.  -Continue amlodipine, metformin and atorvastatin.  -Schedule follow up in 3 months.

## 2021-03-21 DIAGNOSIS — G5603 Carpal tunnel syndrome, bilateral upper limbs: Secondary | ICD-10-CM | POA: Diagnosis not present

## 2021-03-25 ENCOUNTER — Encounter: Payer: Self-pay | Admitting: Physical Therapy

## 2021-03-25 ENCOUNTER — Other Ambulatory Visit: Payer: Self-pay

## 2021-03-25 ENCOUNTER — Ambulatory Visit: Payer: Medicaid Other | Admitting: Physical Therapy

## 2021-03-25 DIAGNOSIS — R293 Abnormal posture: Secondary | ICD-10-CM

## 2021-03-25 DIAGNOSIS — M6281 Muscle weakness (generalized): Secondary | ICD-10-CM

## 2021-03-25 DIAGNOSIS — M25512 Pain in left shoulder: Secondary | ICD-10-CM

## 2021-03-25 DIAGNOSIS — R2230 Localized swelling, mass and lump, unspecified upper limb: Secondary | ICD-10-CM

## 2021-03-25 DIAGNOSIS — Z483 Aftercare following surgery for neoplasm: Secondary | ICD-10-CM
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
## 2021-03-26 ENCOUNTER — Ambulatory Visit
Admission: RE | Admit: 2021-03-26 | Discharge: 2021-03-26 | Disposition: A | Discharge status: Home / Self Care | Payer: Medicaid Other | Source: Ambulatory Visit | Attending: Hematology and Oncology | Admitting: Hematology and Oncology

## 2021-03-26 DIAGNOSIS — M8588 Other specified disorders of bone density and structure, other site: Secondary | ICD-10-CM | POA: Diagnosis not present

## 2021-03-26 DIAGNOSIS — C50412 Malignant neoplasm of upper-outer quadrant of left female breast: Secondary | ICD-10-CM

## 2021-03-26 DIAGNOSIS — Z780 Asymptomatic menopausal state: Secondary | ICD-10-CM | POA: Diagnosis not present

## 2021-03-27 ENCOUNTER — Ambulatory Visit: Payer: Medicaid Other | Admitting: Physical Therapy

## 2021-03-27 DIAGNOSIS — G5603 Carpal tunnel syndrome, bilateral upper limbs: Secondary | ICD-10-CM | POA: Diagnosis not present

## 2021-03-27 DIAGNOSIS — M65311 Trigger thumb, right thumb: Secondary | ICD-10-CM | POA: Diagnosis not present

## 2021-04-01 ENCOUNTER — Ambulatory Visit: Payer: Medicaid Other

## 2021-04-01 ENCOUNTER — Other Ambulatory Visit: Payer: Self-pay

## 2021-04-01 DIAGNOSIS — M6281 Muscle weakness (generalized): Secondary | ICD-10-CM

## 2021-04-01 DIAGNOSIS — Z483 Aftercare following surgery for neoplasm: Secondary | ICD-10-CM

## 2021-04-01 DIAGNOSIS — M25512 Pain in left shoulder: Secondary | ICD-10-CM

## 2021-04-01 DIAGNOSIS — R293 Abnormal posture: Secondary | ICD-10-CM

## 2021-04-01 DIAGNOSIS — R2230 Localized swelling, mass and lump, unspecified upper limb: Secondary | ICD-10-CM
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
## 2021-04-03 ENCOUNTER — Encounter: Payer: Self-pay | Admitting: Physical Therapy

## 2021-04-03 ENCOUNTER — Ambulatory Visit: Payer: Medicaid Other | Admitting: Physical Therapy

## 2021-04-03 ENCOUNTER — Other Ambulatory Visit: Payer: Self-pay

## 2021-04-03 DIAGNOSIS — R293 Abnormal posture: Secondary | ICD-10-CM

## 2021-04-03 DIAGNOSIS — Z483 Aftercare following surgery for neoplasm: Secondary | ICD-10-CM | POA: Diagnosis not present

## 2021-04-03 DIAGNOSIS — M25512 Pain in left shoulder: Secondary | ICD-10-CM

## 2021-04-03 DIAGNOSIS — M6281 Muscle weakness (generalized): Secondary | ICD-10-CM
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
## 2021-04-08 ENCOUNTER — Ambulatory Visit: Payer: Medicaid Other

## 2021-04-08 ENCOUNTER — Other Ambulatory Visit: Payer: Self-pay

## 2021-04-08 DIAGNOSIS — M6281 Muscle weakness (generalized): Secondary | ICD-10-CM

## 2021-04-08 DIAGNOSIS — R2230 Localized swelling, mass and lump, unspecified upper limb: Secondary | ICD-10-CM

## 2021-04-08 DIAGNOSIS — Z483 Aftercare following surgery for neoplasm: Secondary | ICD-10-CM | POA: Diagnosis not present

## 2021-04-08 DIAGNOSIS — M25611 Stiffness of right shoulder, not elsewhere classified: Secondary | ICD-10-CM

## 2021-04-08 DIAGNOSIS — R293 Abnormal posture: Secondary | ICD-10-CM

## 2021-04-08 DIAGNOSIS — M25512 Pain in left shoulder: Secondary | ICD-10-CM
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
## 2021-04-10 ENCOUNTER — Ambulatory Visit: Payer: Medicaid Other | Admitting: Physical Therapy

## 2021-04-10 ENCOUNTER — Encounter: Payer: Self-pay | Admitting: Physical Therapy

## 2021-04-10 ENCOUNTER — Other Ambulatory Visit: Payer: Self-pay

## 2021-04-10 DIAGNOSIS — Z483 Aftercare following surgery for neoplasm: Secondary | ICD-10-CM | POA: Diagnosis not present

## 2021-04-10 DIAGNOSIS — R293 Abnormal posture: Secondary | ICD-10-CM

## 2021-04-10 DIAGNOSIS — M6281 Muscle weakness (generalized): Secondary | ICD-10-CM

## 2021-04-10 DIAGNOSIS — R2230 Localized swelling, mass and lump, unspecified upper limb: Secondary | ICD-10-CM

## 2021-04-10 DIAGNOSIS — M25512 Pain in left shoulder: Secondary | ICD-10-CM

## 2021-04-10 DIAGNOSIS — M25611 Stiffness of right shoulder, not elsewhere classified: Secondary | ICD-10-CM
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
## 2021-04-14 NOTE — Addendum Note (Signed)
Addended by: Claris Pong on: 04/14/2021 10:47 AM   Modules accepted: Orders

## 2021-04-16 ENCOUNTER — Encounter: Payer: Self-pay | Admitting: Adult Health

## 2021-04-16 ENCOUNTER — Encounter: Payer: Self-pay | Admitting: Obstetrics and Gynecology

## 2021-04-16 NOTE — Telephone Encounter (Signed)
FYI

## 2021-04-17 ENCOUNTER — Other Ambulatory Visit: Payer: Self-pay

## 2021-04-17 ENCOUNTER — Encounter: Payer: Self-pay | Admitting: Physical Therapy

## 2021-04-17 ENCOUNTER — Ambulatory Visit: Payer: Medicaid Other | Attending: Hematology and Oncology | Admitting: Physical Therapy

## 2021-04-17 DIAGNOSIS — M25512 Pain in left shoulder: Secondary | ICD-10-CM | POA: Diagnosis not present

## 2021-04-17 DIAGNOSIS — G4733 Obstructive sleep apnea (adult) (pediatric): Secondary | ICD-10-CM | POA: Diagnosis not present

## 2021-04-17 DIAGNOSIS — R293 Abnormal posture: Secondary | ICD-10-CM | POA: Diagnosis present

## 2021-04-17 DIAGNOSIS — M6281 Muscle weakness (generalized): Secondary | ICD-10-CM | POA: Insufficient documentation

## 2021-04-17 DIAGNOSIS — Z483 Aftercare following surgery for neoplasm: Secondary | ICD-10-CM | POA: Insufficient documentation
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
## 2021-04-18 LAB — HM DIABETES EYE EXAM

## 2021-04-19 ENCOUNTER — Ambulatory Visit: Payer: Medicaid Other

## 2021-04-19 ENCOUNTER — Other Ambulatory Visit: Payer: Self-pay

## 2021-04-19 DIAGNOSIS — M25512 Pain in left shoulder: Secondary | ICD-10-CM | POA: Diagnosis not present

## 2021-04-19 DIAGNOSIS — M6281 Muscle weakness (generalized): Secondary | ICD-10-CM

## 2021-04-19 DIAGNOSIS — Z483 Aftercare following surgery for neoplasm: Secondary | ICD-10-CM

## 2021-04-19 DIAGNOSIS — R293 Abnormal posture: Secondary | ICD-10-CM
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
## 2021-04-23 ENCOUNTER — Ambulatory Visit: Payer: Medicaid Other

## 2021-04-25 ENCOUNTER — Encounter (HOSPITAL_BASED_OUTPATIENT_CLINIC_OR_DEPARTMENT_OTHER): Payer: Self-pay

## 2021-04-25 ENCOUNTER — Ambulatory Visit (HOSPITAL_BASED_OUTPATIENT_CLINIC_OR_DEPARTMENT_OTHER): Admit: 2021-04-25 | Payer: Medicaid Other | Admitting: Orthopedic Surgery

## 2021-04-25 ENCOUNTER — Emergency Department (HOSPITAL_COMMUNITY)
Admission: EM | Admit: 2021-04-25 | Discharge: 2021-04-25 | Discharge status: Against Medical Advice | Payer: Medicaid Other | Attending: Emergency Medicine | Admitting: Emergency Medicine

## 2021-04-25 ENCOUNTER — Encounter (HOSPITAL_COMMUNITY): Payer: Self-pay

## 2021-04-25 ENCOUNTER — Other Ambulatory Visit: Payer: Self-pay

## 2021-04-25 DIAGNOSIS — U071 COVID-19: Secondary | ICD-10-CM | POA: Insufficient documentation

## 2021-04-25 DIAGNOSIS — Z5321 Procedure and treatment not carried out due to patient leaving prior to being seen by health care provider: Secondary | ICD-10-CM | POA: Diagnosis not present

## 2021-04-25 DIAGNOSIS — Z8616 Personal history of COVID-19: Secondary | ICD-10-CM

## 2021-04-25 HISTORY — DX: Personal history of COVID-19: Z86.16

## 2021-04-25 SURGERY — CARPAL TUNNEL RELEASE
Anesthesia: Monitor Anesthesia Care | Laterality: Right

## 2021-04-25 NOTE — ED Notes (Signed)
Pt eloped. Pt would not wait for a provider. Dr. Johnney Killian aware. Pt A&Ox4 and ambulatory.  ?

## 2021-04-25 NOTE — ED Provider Notes (Signed)
Patient left without being seen.  She has been been brought back to the room with diagnosis of COVID stable vital signs.  She had informed nursing staff she wished to leave.  I did try to review her chart and see the patient in the room but nurse advises patient eloped. ?  ?Charlesetta Shanks, MD ?04/25/21 2055 ? ?

## 2021-04-25 NOTE — ED Triage Notes (Signed)
Pt c/o cough and body aches x4 days. Pt took a home COVID test today that was positive. Pt states she is hoping for some meds to take home to help her with her symptoms. Pt denies SOB. Pt 98% RA while speaking to staff. Pt ambulatory.  ?

## 2021-04-26 ENCOUNTER — Other Ambulatory Visit: Payer: Self-pay | Admitting: Internal Medicine

## 2021-04-26 DIAGNOSIS — E559 Vitamin D deficiency, unspecified: Secondary | ICD-10-CM

## 2021-05-08 ENCOUNTER — Encounter: Payer: Self-pay | Admitting: Internal Medicine

## 2021-05-13 ENCOUNTER — Other Ambulatory Visit: Payer: Self-pay

## 2021-05-13 MED ORDER — GABAPENTIN 100 MG PO CAPS: 300.0000 mg | ORAL_CAPSULE | Freq: Every day | ORAL | 3 refills | Status: DC

## 2021-05-16 ENCOUNTER — Other Ambulatory Visit: Payer: Self-pay | Admitting: *Deleted

## 2021-05-16 MED ORDER — GABAPENTIN 300 MG PO CAPS: 300.0000 mg | ORAL_CAPSULE | Freq: Every day | ORAL | 1 refills | Status: DC
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
## 2021-05-17 ENCOUNTER — Ambulatory Visit: Payer: Medicaid Other | Admitting: Physical Therapy

## 2021-05-17 DIAGNOSIS — M6281 Muscle weakness (generalized): Secondary | ICD-10-CM

## 2021-05-17 DIAGNOSIS — R293 Abnormal posture: Secondary | ICD-10-CM

## 2021-05-17 DIAGNOSIS — M25512 Pain in left shoulder: Secondary | ICD-10-CM

## 2021-05-18 DIAGNOSIS — G4733 Obstructive sleep apnea (adult) (pediatric): Secondary | ICD-10-CM | POA: Diagnosis not present

## 2021-05-22 DIAGNOSIS — G4733 Obstructive sleep apnea (adult) (pediatric): Secondary | ICD-10-CM | POA: Diagnosis not present

## 2021-05-24 ENCOUNTER — Ambulatory Visit: Payer: Medicaid Other | Admitting: Physical Therapy

## 2021-06-04 ENCOUNTER — Other Ambulatory Visit: Payer: Self-pay | Admitting: Internal Medicine

## 2021-06-04 DIAGNOSIS — E559 Vitamin D deficiency, unspecified: Secondary | ICD-10-CM

## 2021-06-07 ENCOUNTER — Ambulatory Visit: Payer: Self-pay | Admitting: Physical Therapy

## 2021-06-14 DIAGNOSIS — G5601 Carpal tunnel syndrome, right upper limb: Secondary | ICD-10-CM | POA: Diagnosis not present

## 2021-06-17 DIAGNOSIS — G4733 Obstructive sleep apnea (adult) (pediatric): Secondary | ICD-10-CM | POA: Diagnosis not present

## 2021-06-19 ENCOUNTER — Telehealth: Payer: Self-pay | Admitting: *Deleted

## 2021-06-19 NOTE — Telephone Encounter (Signed)
Spoke with patient.  ?Patient agreeable to proceed with surgery in June. She is s/p Carpal tunnel surgery on 4/28.  ?Reviewed dates, request to schedule on 08/05/21. Advised I will return call once surgery date/time confirmed. Patient agreeable.  ? ?Dr. Talbert Nan -please send surgery request.  ?

## 2021-06-19 NOTE — Telephone Encounter (Signed)
Salvadore Dom, MD  Burnice Logan, RN ?Please f/u with her   ?  ?   ?Previous Messages ?  ?----- Message -----  ?From: [name], CMA  ?Sent: 05/03/2021   2:28 PM EDT  ?To: Salvadore Dom, MD  ?Subject: FW: Unread Message Notification                ? ?FYI. Pt notified and voiced understanding. Reports that she is placing priority over her carpel tunnel surgery first. Reports it was scheduled for 04/26/21 but got COVID so it is being pushed out. Doesn't know a set date on it at the moment but will notify us once she is ready to get Hysteroscopy/D&C scheduled. States the recovery time after carpel tunnel procedure is a few weeks and plans on having D&C scheduled in 05/2021 or 06/2021.  ?----- Message -----  ?From: Salvadore Dom, MD  ?Sent: 04/19/2021  ?To: Lauren Garrison  ?Subject: Unread Message Notification                    ? ?Hi Lauren Garrison,  ?Your endometrial stripe was thickened on ultrasound. The endometrial biopsy doesn't explain your postmenopausal bleeding or thickened endometrial stripe. I'm concerned that you may have an endometrial polyp. Endometrial polyps can have precancerous or cancerous changes. I would definably recommend that you have the hysteroscopy, D&C.  ?The risks of the procedure are very small and the recovery is very quick.  ?Please reach out with further questions.  ?Best,  ?[name]  ? ? ?----- Message -----  ?     From:Lauren Garrison  ?     Sent:04/16/2021 11:36 AM EST  ?       EO:FHQR [name], MD  ?  Subject:D&C Surgery  ? ?Good  day Dr Talbert Nan,  ?I apologize for the delay follow up with you regarding your recommendation of the D&C procedure. You may have been apprised I opted not to have surgery at this time. However, based on your experience if you feel it is pertinent I have the procedure without further delay please advise my accordingly for my reconsideration.  ?Best, [name] '   ?

## 2021-06-26 ENCOUNTER — Ambulatory Visit: Payer: Medicaid Other | Admitting: Internal Medicine

## 2021-06-27 NOTE — Telephone Encounter (Signed)
No changes

## 2021-06-27 NOTE — Telephone Encounter (Signed)
Surgery request sent.  ? ?Routing to [facility].  ?

## 2021-06-27 NOTE — Telephone Encounter (Signed)
Dr. Talbert Nan -any changes to previous surgery request from 02/2021?  ? ? ?Surgery: CPT [phone] - Hysteroscopy/D&C/Myosure,  ?  ?Diagnosis: R93.89 Thickened Endometrium, N95.0 Postmenopausal Bleeding,  ?  ?Location: [facility] ?  ?Status: Outpatient ?  ?Time: 30 Minutes ?  ?Assistant: N/A ?  ?Urgency: At Patient's Convenience ?  ?Pre-Op Appointment: Completed ?  ?Post-Op Appointment(s): 1 Week,  ?  ?Time Out Of Work: Day Of Surgery, 1 Day Post Op ?

## 2021-06-27 NOTE — Telephone Encounter (Signed)
Spoke with patient. Surgery date request confirmed.  ?Advised surgery is scheduled for [facility] on 08/05/21 at 0730.  ?Surgery instruction sheet and hospital brochure reviewed, printed copy will be mailed.  ?Patient verbalizes understanding and is agreeable.  ? ?Routing to [place] and [place] ?Encounter can be closed after benefits reviewed.  ? ?

## 2021-07-02 NOTE — Telephone Encounter (Signed)
Spoke with patient regarding surgery benefits. Patient acknowledges understanding of information presented. Patient is aware that benefits presented are professional benefits only. Patient is aware the hospital will call with facility benefits. See account note. ? ?Encounter closed. ?

## 2021-07-03 DIAGNOSIS — G5601 Carpal tunnel syndrome, right upper limb: Secondary | ICD-10-CM | POA: Diagnosis not present

## 2021-07-03 DIAGNOSIS — G5603 Carpal tunnel syndrome, bilateral upper limbs: Secondary | ICD-10-CM | POA: Diagnosis not present

## 2021-07-04 ENCOUNTER — Encounter: Payer: Self-pay | Admitting: Internal Medicine

## 2021-07-04 ENCOUNTER — Other Ambulatory Visit: Payer: Self-pay | Admitting: Internal Medicine

## 2021-07-10 ENCOUNTER — Encounter: Payer: Self-pay | Admitting: Obstetrics and Gynecology

## 2021-07-10 ENCOUNTER — Ambulatory Visit (INDEPENDENT_AMBULATORY_CARE_PROVIDER_SITE_OTHER): Payer: Medicaid Other | Admitting: Obstetrics and Gynecology

## 2021-07-10 VITALS — BP 128/74 | HR 62 | Ht 65.0 in | Wt 248.0 lb

## 2021-07-10 DIAGNOSIS — Z8639 Personal history of other endocrine, nutritional and metabolic disease: Secondary | ICD-10-CM | POA: Diagnosis not present

## 2021-07-10 DIAGNOSIS — Z01818 Encounter for other preprocedural examination: Secondary | ICD-10-CM

## 2021-07-10 DIAGNOSIS — R9389 Abnormal findings on diagnostic imaging of other specified body structures: Secondary | ICD-10-CM | POA: Diagnosis not present

## 2021-07-10 DIAGNOSIS — N950 Postmenopausal bleeding: Secondary | ICD-10-CM | POA: Diagnosis not present
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
## 2021-07-11 ENCOUNTER — Other Ambulatory Visit: Payer: Self-pay

## 2021-07-11 ENCOUNTER — Encounter: Payer: Self-pay | Admitting: Oncology

## 2021-07-11 ENCOUNTER — Inpatient Hospital Stay: Payer: Medicaid Other | Attending: Hematology and Oncology | Admitting: Hematology and Oncology

## 2021-07-11 ENCOUNTER — Encounter: Payer: Self-pay | Admitting: Hematology and Oncology

## 2021-07-11 VITALS — BP 163/83 | HR 87 | Temp 98.1°F | Resp 16 | Ht 65.0 in

## 2021-07-11 DIAGNOSIS — Z170 Estrogen receptor positive status [ER+]: Secondary | ICD-10-CM

## 2021-07-11 DIAGNOSIS — C773 Secondary and unspecified malignant neoplasm of axilla and upper limb lymph nodes: Secondary | ICD-10-CM | POA: Insufficient documentation

## 2021-07-11 DIAGNOSIS — E559 Vitamin D deficiency, unspecified: Secondary | ICD-10-CM

## 2021-07-11 DIAGNOSIS — C50412 Malignant neoplasm of upper-outer quadrant of left female breast: Secondary | ICD-10-CM | POA: Diagnosis not present

## 2021-07-11 DIAGNOSIS — T451X5A Adverse effect of antineoplastic and immunosuppressive drugs, initial encounter: Secondary | ICD-10-CM

## 2021-07-11 DIAGNOSIS — Z79899 Other long term (current) drug therapy: Secondary | ICD-10-CM | POA: Diagnosis not present

## 2021-07-11 DIAGNOSIS — G620 Drug-induced polyneuropathy: Secondary | ICD-10-CM

## 2021-07-11 DIAGNOSIS — Z79811 Long term (current) use of aromatase inhibitors: Secondary | ICD-10-CM | POA: Insufficient documentation

## 2021-07-11 LAB — CBC
HCT: 42.9 % (ref 35.0–45.0)
Hemoglobin: 14 g/dL (ref 11.7–15.5)
MCH: 29.4 pg (ref 27.0–33.0)
MCHC: 32.6 g/dL (ref 32.0–36.0)
MCV: 89.9 fL (ref 80.0–100.0)
MPV: 11.7 fL (ref 7.5–12.5)
Platelets: 204 10*3/uL (ref 140–400)
RBC: 4.77 10*6/uL (ref 3.80–5.10)
RDW: 14 % (ref 11.0–15.0)
WBC: 4.8 10*3/uL (ref 3.8–10.8)

## 2021-07-11 LAB — COMPREHENSIVE METABOLIC PANEL
AG Ratio: 1.3 (calc) (ref 1.0–2.5)
ALT: 19 U/L (ref 6–29)
AST: 14 U/L (ref 10–35)
Albumin: 4 g/dL (ref 3.6–5.1)
Alkaline phosphatase (APISO): 112 U/L (ref 37–153)
BUN: 12 mg/dL (ref 7–25)
CO2: 26 mmol/L (ref 20–32)
Calcium: 9.7 mg/dL (ref 8.6–10.4)
Chloride: 104 mmol/L (ref 98–110)
Creat: 0.78 mg/dL (ref 0.50–1.05)
Globulin: 3.2 g/dL (calc) (ref 1.9–3.7)
Glucose, Bld: 120 mg/dL — ABNORMAL HIGH (ref 65–99)
Potassium: 3.9 mmol/L (ref 3.5–5.3)
Sodium: 142 mmol/L (ref 135–146)
Total Bilirubin: 0.3 mg/dL (ref 0.2–1.2)
Total Protein: 7.2 g/dL (ref 6.1–8.1)

## 2021-07-11 LAB — HEMOGLOBIN A1C
Hgb A1c MFr Bld: 6.1 % of total Hgb — ABNORMAL HIGH (ref <5.7)
Mean Plasma Glucose: 128 mg/dL
eAG (mmol/L): 7.1 mmol/L

## 2021-07-11 MED ORDER — PREGABALIN 50 MG PO CAPS: 50.0000 mg | ORAL_CAPSULE | Freq: Every day | ORAL | 1 refills | Status: DC
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
## 2021-07-12 DIAGNOSIS — M25641 Stiffness of right hand, not elsewhere classified: Secondary | ICD-10-CM | POA: Diagnosis not present

## 2021-07-18 DIAGNOSIS — M25641 Stiffness of right hand, not elsewhere classified: Secondary | ICD-10-CM | POA: Diagnosis not present

## 2021-07-18 DIAGNOSIS — G4733 Obstructive sleep apnea (adult) (pediatric): Secondary | ICD-10-CM | POA: Diagnosis not present

## 2021-07-22 DIAGNOSIS — G5603 Carpal tunnel syndrome, bilateral upper limbs: Secondary | ICD-10-CM | POA: Diagnosis not present

## 2021-07-22 DIAGNOSIS — G5601 Carpal tunnel syndrome, right upper limb: Secondary | ICD-10-CM | POA: Diagnosis not present

## 2021-07-22 DIAGNOSIS — G5602 Carpal tunnel syndrome, left upper limb: Secondary | ICD-10-CM | POA: Diagnosis not present

## 2021-07-23 ENCOUNTER — Other Ambulatory Visit: Payer: Self-pay | Admitting: Oncology

## 2021-07-29 DIAGNOSIS — M25641 Stiffness of right hand, not elsewhere classified: Secondary | ICD-10-CM | POA: Diagnosis not present

## 2021-07-30 ENCOUNTER — Other Ambulatory Visit: Payer: Self-pay

## 2021-07-30 ENCOUNTER — Encounter (HOSPITAL_BASED_OUTPATIENT_CLINIC_OR_DEPARTMENT_OTHER): Payer: Self-pay | Admitting: Obstetrics and Gynecology

## 2021-07-30 DIAGNOSIS — R7303 Prediabetes: Secondary | ICD-10-CM

## 2021-07-30 HISTORY — DX: Prediabetes: R73.03

## 2021-07-30 NOTE — Progress Notes (Signed)
Spoke w/ via phone for pre-op interview---pt Lab needs dos----    I stat           Lab results------none COVID test -----patient states asymptomatic no test needed Arrive at -------845 am NPO after MN NO Solid Food.  Clear liquids from MN until---745 am Med rec completed Medications to take morning of surgery -----Amlodipine, anastrazole Diabetic medication -----n/a Patient instructed no nail polish to be worn day of surgery Patient instructed to bring photo id and insurance card day of surgery Patient aware to have Driver (ride ) / caregiver    son russel toland jr cell [phone] for 24 hours after surgery  Patient Special Instructions -----none Pre-Op special Istructions -----bring cpap mask tubing and machine and leave in car Patient verbalized understanding of instructions that were given at this phone interview. Patient denies shortness of breath, chest pain, fever, cough at this phone interview.   Sleep study 06-18-2020 epic [facility] dr Lonell Face 07-11-2021 epic  Addendum: reviewed patient history and sleep study results from 06-18-2020 epic (severe osa) and patient uses cpap nighttly patient ok for [facility]   surgery 08-05-2021 per dr c Lanetta Inch mda

## 2021-08-05 ENCOUNTER — Encounter (HOSPITAL_BASED_OUTPATIENT_CLINIC_OR_DEPARTMENT_OTHER): Payer: Self-pay | Admitting: Obstetrics and Gynecology

## 2021-08-05 ENCOUNTER — Ambulatory Visit (HOSPITAL_BASED_OUTPATIENT_CLINIC_OR_DEPARTMENT_OTHER): Payer: Medicaid Other | Admitting: Anesthesiology

## 2021-08-05 ENCOUNTER — Encounter (HOSPITAL_BASED_OUTPATIENT_CLINIC_OR_DEPARTMENT_OTHER)
Admission: RE | Disposition: A | Discharge status: Home / Self Care | Payer: Self-pay | Source: Ambulatory Visit | Attending: Obstetrics and Gynecology

## 2021-08-05 ENCOUNTER — Ambulatory Visit (HOSPITAL_BASED_OUTPATIENT_CLINIC_OR_DEPARTMENT_OTHER)
Admission: RE | Admit: 2021-08-05 | Discharge: 2021-08-05 | Disposition: A | Discharge status: Home / Self Care | Payer: Medicaid Other | Source: Ambulatory Visit | Attending: Obstetrics and Gynecology | Admitting: Obstetrics and Gynecology

## 2021-08-05 ENCOUNTER — Other Ambulatory Visit: Payer: Self-pay

## 2021-08-05 DIAGNOSIS — R9389 Abnormal findings on diagnostic imaging of other specified body structures: Secondary | ICD-10-CM

## 2021-08-05 DIAGNOSIS — Z6841 Body Mass Index (BMI) 40.0 and over, adult: Secondary | ICD-10-CM | POA: Diagnosis not present

## 2021-08-05 DIAGNOSIS — N840 Polyp of corpus uteri: Secondary | ICD-10-CM | POA: Insufficient documentation

## 2021-08-05 DIAGNOSIS — Z7984 Long term (current) use of oral hypoglycemic drugs: Secondary | ICD-10-CM | POA: Diagnosis not present

## 2021-08-05 DIAGNOSIS — N950 Postmenopausal bleeding: Secondary | ICD-10-CM | POA: Insufficient documentation

## 2021-08-05 DIAGNOSIS — G4730 Sleep apnea, unspecified: Secondary | ICD-10-CM | POA: Insufficient documentation

## 2021-08-05 DIAGNOSIS — E119 Type 2 diabetes mellitus without complications: Secondary | ICD-10-CM | POA: Diagnosis not present

## 2021-08-05 DIAGNOSIS — N72 Inflammatory disease of cervix uteri: Secondary | ICD-10-CM | POA: Diagnosis not present

## 2021-08-05 DIAGNOSIS — Z853 Personal history of malignant neoplasm of breast: Secondary | ICD-10-CM | POA: Insufficient documentation

## 2021-08-05 DIAGNOSIS — I10 Essential (primary) hypertension: Secondary | ICD-10-CM | POA: Insufficient documentation

## 2021-08-05 DIAGNOSIS — K219 Gastro-esophageal reflux disease without esophagitis: Secondary | ICD-10-CM | POA: Insufficient documentation

## 2021-08-05 DIAGNOSIS — Z79899 Other long term (current) drug therapy: Secondary | ICD-10-CM | POA: Diagnosis not present

## 2021-08-05 DIAGNOSIS — Z01818 Encounter for other preprocedural examination: Secondary | ICD-10-CM

## 2021-08-05 HISTORY — DX: Dyspnea, unspecified: R06.00

## 2021-08-05 HISTORY — DX: Lymphedema, not elsewhere classified: I89.0

## 2021-08-05 HISTORY — DX: Sleep apnea, unspecified: G47.30

## 2021-08-05 HISTORY — DX: Gastro-esophageal reflux disease without esophagitis: K21.9

## 2021-08-05 HISTORY — DX: Carpal tunnel syndrome, bilateral upper limbs: G56.03

## 2021-08-05 LAB — POCT I-STAT, CHEM 8
BUN: 12 mg/dL (ref 6–20)
Calcium, Ion: 1.27 mmol/L (ref 1.15–1.40)
Chloride: 102 mmol/L (ref 98–111)
Creatinine, Ser: 0.7 mg/dL (ref 0.44–1.00)
Glucose, Bld: 121 mg/dL — ABNORMAL HIGH (ref 70–99)
HCT: 41 % (ref 36.0–46.0)
Hemoglobin: 13.9 g/dL (ref 12.0–15.0)
Potassium: 3.4 mmol/L — ABNORMAL LOW (ref 3.5–5.1)
Sodium: 142 mmol/L (ref 135–145)
TCO2: 26 mmol/L (ref 22–32)

## 2021-08-05 SURGERY — DILATATION & CURETTAGE/HYSTEROSCOPY WITH MYOSURE
Anesthesia: General

## 2021-08-05 MED ORDER — MEPERIDINE HCL 25 MG/ML IJ SOLN: 6.2500 mg | INTRAMUSCULAR | Status: DC | PRN

## 2021-08-05 MED ORDER — OXYCODONE HCL 5 MG PO TABS: 5.0000 mg | ORAL_TABLET | Freq: Once | ORAL | Status: DC | PRN

## 2021-08-05 MED ORDER — ACETAMINOPHEN 500 MG PO TABS
ORAL_TABLET | ORAL | Status: AC
  Filled 2021-08-05: qty 2

## 2021-08-05 MED ORDER — PROPOFOL 10 MG/ML IV BOLUS
INTRAVENOUS | Status: AC
  Filled 2021-08-05: qty 20

## 2021-08-05 MED ORDER — FENTANYL CITRATE (PF) 100 MCG/2ML IJ SOLN
INTRAMUSCULAR | Status: AC
  Filled 2021-08-05: qty 2

## 2021-08-05 MED ORDER — ONDANSETRON HCL 4 MG/2ML IJ SOLN
INTRAMUSCULAR | Status: AC
  Filled 2021-08-05: qty 2

## 2021-08-05 MED ORDER — LACTATED RINGERS IV SOLN: INTRAVENOUS | Status: DC

## 2021-08-05 MED ORDER — MIDAZOLAM HCL 2 MG/2ML IJ SOLN
INTRAMUSCULAR | Status: DC | PRN
  Administered 2021-08-05: 2 mg via INTRAVENOUS

## 2021-08-05 MED ORDER — ONDANSETRON HCL 4 MG/2ML IJ SOLN
INTRAMUSCULAR | Status: DC | PRN
  Administered 2021-08-05: 4 mg via INTRAVENOUS

## 2021-08-05 MED ORDER — KETOROLAC TROMETHAMINE 30 MG/ML IJ SOLN
INTRAMUSCULAR | Status: AC
  Filled 2021-08-05: qty 1

## 2021-08-05 MED ORDER — LIDOCAINE 2% (20 MG/ML) 5 ML SYRINGE
INTRAMUSCULAR | Status: DC | PRN
  Administered 2021-08-05: 40 mg via INTRAVENOUS

## 2021-08-05 MED ORDER — MIDAZOLAM HCL 2 MG/2ML IJ SOLN
INTRAMUSCULAR | Status: AC
  Filled 2021-08-05: qty 2

## 2021-08-05 MED ORDER — SODIUM CHLORIDE 0.9 % IV SOLN
INTRAVENOUS | Status: AC | PRN
  Administered 2021-08-05: 3000 mL

## 2021-08-05 MED ORDER — DEXAMETHASONE SODIUM PHOSPHATE 10 MG/ML IJ SOLN
INTRAMUSCULAR | Status: DC | PRN
  Administered 2021-08-05: 10 mg via INTRAVENOUS

## 2021-08-05 MED ORDER — FENTANYL CITRATE (PF) 100 MCG/2ML IJ SOLN
INTRAMUSCULAR | Status: DC | PRN
  Administered 2021-08-05 (×2): 25 ug via INTRAVENOUS
  Administered 2021-08-05: 50 ug via INTRAVENOUS

## 2021-08-05 MED ORDER — DIPHENHYDRAMINE HCL 50 MG/ML IJ SOLN
INTRAMUSCULAR | Status: AC
  Filled 2021-08-05: qty 1

## 2021-08-05 MED ORDER — DEXAMETHASONE SODIUM PHOSPHATE 10 MG/ML IJ SOLN
INTRAMUSCULAR | Status: AC
  Filled 2021-08-05: qty 1

## 2021-08-05 MED ORDER — FENTANYL CITRATE (PF) 100 MCG/2ML IJ SOLN
25.0000 ug | INTRAMUSCULAR | Status: DC | PRN
  Administered 2021-08-05: 25 ug via INTRAVENOUS

## 2021-08-05 MED ORDER — MIDAZOLAM HCL 2 MG/2ML IJ SOLN: 0.5000 mg | Freq: Once | INTRAMUSCULAR | Status: DC | PRN

## 2021-08-05 MED ORDER — LIDOCAINE HCL (PF) 2 % IJ SOLN
INTRAMUSCULAR | Status: AC
  Filled 2021-08-05: qty 5

## 2021-08-05 MED ORDER — KETOROLAC TROMETHAMINE 30 MG/ML IJ SOLN
INTRAMUSCULAR | Status: DC | PRN
  Administered 2021-08-05: 30 mg via INTRAVENOUS

## 2021-08-05 MED ORDER — ACETAMINOPHEN 500 MG PO TABS
1000.0000 mg | ORAL_TABLET | ORAL | Status: AC
  Administered 2021-08-05: 1000 mg via ORAL

## 2021-08-05 MED ORDER — PROPOFOL 10 MG/ML IV BOLUS
INTRAVENOUS | Status: DC | PRN
  Administered 2021-08-05: 200 mg via INTRAVENOUS

## 2021-08-05 MED ORDER — DIPHENHYDRAMINE HCL 50 MG/ML IJ SOLN
INTRAMUSCULAR | Status: DC | PRN
  Administered 2021-08-05: 6.25 mg via INTRAVENOUS

## 2021-08-05 MED ORDER — SCOPOLAMINE 1 MG/3DAYS TD PT72
1.0000 | MEDICATED_PATCH | Freq: Once | TRANSDERMAL | Status: DC
  Administered 2021-08-05: 1.5 mg via TRANSDERMAL

## 2021-08-05 MED ORDER — SCOPOLAMINE 1 MG/3DAYS TD PT72
MEDICATED_PATCH | TRANSDERMAL | Status: AC
  Filled 2021-08-05: qty 1

## 2021-08-05 MED ORDER — OXYCODONE HCL 5 MG/5ML PO SOLN: 5.0000 mg | Freq: Once | ORAL | Status: DC | PRN

## 2021-08-05 MED ORDER — PROPOFOL 500 MG/50ML IV EMUL
INTRAVENOUS | Status: DC | PRN
  Administered 2021-08-05: 250 ug/kg/min via INTRAVENOUS

## 2021-08-05 SURGICAL SUPPLY — 22 items
CATH ROBINSON RED A/P 16FR (CATHETERS) IMPLANT
DEVICE MYOSURE LITE (MISCELLANEOUS) IMPLANT
DEVICE MYOSURE REACH (MISCELLANEOUS) IMPLANT
DILATOR CANAL MILEX (MISCELLANEOUS) ×1 IMPLANT
DRSG TELFA 3X8 NADH (GAUZE/BANDAGES/DRESSINGS) ×2 IMPLANT
GAUZE 4X4 16PLY ~~LOC~~+RFID DBL (SPONGE) ×4 IMPLANT
GLOVE BIO SURGEON STRL SZ 6.5 (GLOVE) ×2 IMPLANT
GOWN STRL REUS W/TWL LRG LVL3 (GOWN DISPOSABLE) ×2 IMPLANT
IV NS IRRIG 3000ML ARTHROMATIC (IV SOLUTION) ×2 IMPLANT
KIT PROCEDURE FLUENT (KITS) ×2 IMPLANT
KIT TURNOVER CYSTO (KITS) ×2 IMPLANT
MYOSURE XL FIBROID (MISCELLANEOUS)
PACK VAGINAL MINOR WOMEN LF (CUSTOM PROCEDURE TRAY) ×2 IMPLANT
PAD DRESSING TELFA 3X8 NADH (GAUZE/BANDAGES/DRESSINGS) ×1 IMPLANT
PAD OB MATERNITY 4.3X12.25 (PERSONAL CARE ITEMS) ×2 IMPLANT
PAD PREP 24X48 CUFFED NSTRL (MISCELLANEOUS) ×2 IMPLANT
SEAL CERVICAL OMNI LOK (ABLATOR) IMPLANT
SEAL ROD LENS SCOPE MYOSURE (ABLATOR) ×2 IMPLANT
SYR 20ML LL LF (SYRINGE) IMPLANT
SYSTEM TISS REMOVAL MYOSURE XL (MISCELLANEOUS) IMPLANT
TOWEL OR 17X26 10 PK STRL BLUE (TOWEL DISPOSABLE) ×2 IMPLANT
WATER STERILE IRR 500ML POUR (IV SOLUTION) IMPLANT

## 2021-08-05 NOTE — Interval H&P Note (Signed)
History and Physical Interval Note:  08/05/2021 9:31 AM  Lauren Garrison  has presented today for surgery, with the diagnosis of postmenopausal bleeding, thickened endometrium.  The various methods of treatment have been discussed with the patient and family. After consideration of risks, benefits and other options for treatment, the patient has consented to  Procedure(s): [place] (N/A) as a surgical intervention.  The patient's history has been reviewed, patient examined, no change in status, stable for surgery.  I have reviewed the patient's chart and labs.  Questions were answered to the patient's satisfaction.     Salvadore Dom

## 2021-08-05 NOTE — Transfer of Care (Signed)
Immediate Anesthesia Transfer of Care Note  Patient: Lauren Garrison  Procedure(s) Performed: Procedure(s) (LRB): DILATATION & CURETTAGE/HYSTEROSCOPY WITH MYOSURE (N/A)  Patient Location: PACU  Anesthesia Type: General  Level of Consciousness: awake, alert  and oriented  Airway & Oxygen Therapy: Patient Spontanous Breathing   Post-op Assessment: Report given to PACU RN and Post -op Vital signs reviewed and stable  Post vital signs: Reviewed and stable  Complications: No apparent anesthesia complications  Last Vitals:  Vitals Value Taken Time  BP 137/75 08/05/21 1050  Temp 37 C 08/05/21 1050  Pulse 95 08/05/21 1054  Resp 17 08/05/21 1054  SpO2 90 % 08/05/21 1054  Vitals shown include unvalidated device data.  Last Pain:  Vitals:   08/05/21 0914  TempSrc: Oral         Complications: No notable events documented.

## 2021-08-05 NOTE — Op Note (Signed)
Preoperative Diagnosis: postmenopausal bleeding, thickened endometrium  Postoperative Diagnosis: same  Procedure: Hysteroscopy, polypectomy, dilation and curettage  Surgeon: Dr [name]  Assistants: None  Anesthesia: General via LMA  EBL: 3 cc  Fluids:  300 cc  Fluid deficit: 45 cc  Urine output: not recoreded  Indications for surgery: The patient is a 61 yo female, who presented with postmenopausal bleeding. Work up included an ultrasound with an 8 mm stripe, an endometrial biopsy with inactive endometrium and a negative pap.  The risks of the surgery were reviewed with the patient and the consent form was signed prior to her surgery.  Findings: Hysteroscopy: endometrial polyp, otherwise thin endometrium, normal tubal ostia bilaterally  Specimens: endometrial polyp, endometrial curetting's    Procedure: The patient was taken to the operating room with an IV in place. She was placed in the dorsal lithotomy position and anesthesia was administered. She was prepped and draped in the usual sterile fashion for a vaginal procedure. She voided prior to surgery. A weighted speculum was placed in the vagina and a single tooth tenaculum was placed on the anterior lip of the cervix. The cervix was dilated to a #6 hagar dilator. The uterus was sounded to 8 cm. The myosure hysteroscope was inserted into the uterine cavity. With continuous infusion of normal saline, the uterine cavity was visualized with the above findings. The myosure reach was used to resect the polyp. The myosure was then removed. The cavity was then curetted with the small sharp curette. The cavity had the characteristically gritty texture at the end of the procedure. The curette and the single tooth tenaculum were removed. The patients perineum was cleansed of betadine and she was taken out of the dorsal lithotomy position.  Upon awakening the LMA was removed and the patient was transferred to the recovery room in stable and  awake condition.  The sponge and instrument count were correct. There were no complications.

## 2021-08-05 NOTE — Anesthesia Postprocedure Evaluation (Signed)
Anesthesia Post Note  Patient: Lauren Garrison  Procedure(s) Performed: [place]     Patient location during evaluation: PACU Anesthesia Type: General Level of consciousness: awake and alert, patient cooperative and oriented Pain management: pain level controlled Vital Signs Assessment: post-procedure vital signs reviewed and stable Respiratory status: spontaneous breathing, nonlabored ventilation and respiratory function stable Cardiovascular status: blood pressure returned to baseline and stable Postop Assessment: no apparent nausea or vomiting, able to ambulate and adequate PO intake Anesthetic complications: no   No notable events documented.  Last Vitals:  Vitals:   08/05/21 1120 08/05/21 1130  BP: (!) 164/77 (!) 165/88  Pulse: 84 95  Resp: 16 18  Temp:    SpO2: (!) 87% 92%    Last Pain:  Vitals:   08/05/21 1130  TempSrc:   PainSc: 4                  Cayton Cuevas,E. Buffie Herne

## 2021-08-05 NOTE — Anesthesia Procedure Notes (Signed)
Procedure Name: LMA Insertion Date/Time: 08/05/2021 10:17 AM  Performed by: Mechele Claude, CRNAPre-anesthesia Checklist: Patient identified, Emergency Drugs available, Suction available and Patient being monitored Patient Re-evaluated:Patient Re-evaluated prior to induction Oxygen Delivery Method: Circle system utilized Preoxygenation: Pre-oxygenation with 100% oxygen Induction Type: IV induction Ventilation: Mask ventilation without difficulty LMA: LMA inserted LMA Size: 4.0 Number of attempts: 1 Airway Equipment and Method: Bite block Placement Confirmation: positive ETCO2 Tube secured with: Tape Dental Injury: Teeth and Oropharynx as per pre-operative assessment

## 2021-08-05 NOTE — Anesthesia Preprocedure Evaluation (Addendum)
Anesthesia Evaluation  Patient identified by MRN, date of birth, ID band Patient awake    Reviewed: Allergy & Precautions, NPO status , Patient's Chart, lab work & pertinent test results  History of Anesthesia Complications (+) PONV and history of anesthetic complications  Airway Mallampati: II  TM Distance: >3 FB Neck ROM: Full    Dental  (+) Dental Advisory Given   Pulmonary sleep apnea and Continuous Positive Airway Pressure Ventilation ,    breath sounds clear to auscultation       Cardiovascular hypertension, Pt. on medications (-) angina Rhythm:Regular Rate:Normal     Neuro/Psych  Headaches,    GI/Hepatic Neg liver ROS, GERD  Controlled,  Endo/Other  diabetes (diet controlled, glu 121)Morbid obesity  Renal/GU negative Renal ROS     Musculoskeletal   Abdominal (+) + obese,   Peds  Hematology negative hematology ROS (+)   Anesthesia Other Findings H/o breast cancer  Reproductive/Obstetrics                            Anesthesia Physical Anesthesia Plan  ASA: 3  Anesthesia Plan: General   Post-op Pain Management: Tylenol PO (pre-op)*   Induction: Intravenous  PONV Risk Score and Plan: 4 or greater and Ondansetron, Dexamethasone, Scopolamine patch - Pre-op and TIVA  Airway Management Planned: LMA  Additional Equipment: None  Intra-op Plan:   Post-operative Plan:   Informed Consent: I have reviewed the patients History and Physical, chart, labs and discussed the procedure including the risks, benefits and alternatives for the proposed anesthesia with the patient or authorized representative who has indicated his/her understanding and acceptance.     Dental advisory given  Plan Discussed with: CRNA and Surgeon  Anesthesia Plan Comments:         Anesthesia Quick Evaluation

## 2021-08-05 NOTE — Discharge Instructions (Addendum)
   D & C Home care Instructions:   Personal hygiene:  Used sanitary napkins for vaginal drainage not tampons. Shower or tub bathe the day after your procedure. No douching until bleeding stops. Always wipe from front to back after  Elimination.  Activity: Do not drive or operate any equipment today. The effects of the anesthesia are still present and drowsiness may result. Rest today, not necessarily flat bed rest, just take it easy. You may resume your normal activity in one to 2 days.  Sexual activity: No intercourse for one week or as indicated by your physician  Diet: Eat a light diet as desired this evening. You may resume a regular diet tomorrow.  Return to work: One to 2 days.  General Expectations of your surgery: Vaginal bleeding should be no heavier than a normal period. Spotting may continue up to 10 days. Mild cramps may continue for a couple of days. You may have a regular period in 2-6 weeks.  Unexpected observations call your doctor if these occur: persistent or heavy bleeding. Severe abdominal cramping or pain. Elevation of temperature greater than 100F.  Call for an appointment in one week.   No acetaminophen/Tylenol until after 3:40 pm today if needed.   No ibuprofen, Advil, Aleve, Motrin, ketorolac, meloxicam, naproxen, or other NSAIDS until after 4:45 pm today if needed.

## 2021-08-06 LAB — SURGICAL PATHOLOGY

## 2021-08-08 ENCOUNTER — Encounter (HOSPITAL_BASED_OUTPATIENT_CLINIC_OR_DEPARTMENT_OTHER): Payer: Self-pay | Admitting: Obstetrics and Gynecology

## 2021-08-09 DIAGNOSIS — M25641 Stiffness of right hand, not elsewhere classified: Secondary | ICD-10-CM | POA: Diagnosis not present

## 2021-08-14 ENCOUNTER — Ambulatory Visit: Payer: 59 | Admitting: Nurse Practitioner

## 2021-08-14 ENCOUNTER — Ambulatory Visit (INDEPENDENT_AMBULATORY_CARE_PROVIDER_SITE_OTHER): Payer: Medicaid Other | Admitting: Obstetrics and Gynecology

## 2021-08-14 ENCOUNTER — Encounter: Payer: Self-pay | Admitting: Obstetrics and Gynecology

## 2021-08-14 VITALS — BP 110/72 | HR 67 | Ht 65.0 in

## 2021-08-14 DIAGNOSIS — Z9889 Other specified postprocedural states: Secondary | ICD-10-CM
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
## 2021-08-16 DIAGNOSIS — M25641 Stiffness of right hand, not elsewhere classified: Secondary | ICD-10-CM | POA: Diagnosis not present

## 2021-08-17 DIAGNOSIS — G4733 Obstructive sleep apnea (adult) (pediatric): Secondary | ICD-10-CM | POA: Diagnosis not present

## 2021-08-19 DIAGNOSIS — G5601 Carpal tunnel syndrome, right upper limb: Secondary | ICD-10-CM | POA: Diagnosis not present

## 2021-08-21 ENCOUNTER — Ambulatory Visit: Payer: Medicaid Other | Admitting: Nurse Practitioner

## 2021-08-21 DIAGNOSIS — G4733 Obstructive sleep apnea (adult) (pediatric): Secondary | ICD-10-CM | POA: Diagnosis not present

## 2021-09-16 DIAGNOSIS — M25561 Pain in right knee: Secondary | ICD-10-CM | POA: Diagnosis not present

## 2021-09-16 DIAGNOSIS — G5603 Carpal tunnel syndrome, bilateral upper limbs: Secondary | ICD-10-CM | POA: Diagnosis not present

## 2021-09-16 DIAGNOSIS — M25562 Pain in left knee: Secondary | ICD-10-CM | POA: Diagnosis not present

## 2021-09-17 DIAGNOSIS — G4733 Obstructive sleep apnea (adult) (pediatric): Secondary | ICD-10-CM | POA: Diagnosis not present

## 2021-10-08 ENCOUNTER — Encounter: Payer: Self-pay | Admitting: Internal Medicine

## 2021-10-08 ENCOUNTER — Ambulatory Visit (INDEPENDENT_AMBULATORY_CARE_PROVIDER_SITE_OTHER): Payer: Medicaid Other | Admitting: Internal Medicine

## 2021-10-08 VITALS — BP 162/98 | HR 85 | Temp 98.3°F | Wt 241.9 lb

## 2021-10-08 DIAGNOSIS — G43009 Migraine without aura, not intractable, without status migrainosus: Secondary | ICD-10-CM

## 2021-10-08 DIAGNOSIS — Z170 Estrogen receptor positive status [ER+]: Secondary | ICD-10-CM | POA: Diagnosis not present

## 2021-10-08 DIAGNOSIS — E1169 Type 2 diabetes mellitus with other specified complication: Secondary | ICD-10-CM | POA: Diagnosis not present

## 2021-10-08 DIAGNOSIS — I10 Essential (primary) hypertension: Secondary | ICD-10-CM

## 2021-10-08 DIAGNOSIS — C50412 Malignant neoplasm of upper-outer quadrant of left female breast: Secondary | ICD-10-CM | POA: Diagnosis not present

## 2021-10-08 DIAGNOSIS — E785 Hyperlipidemia, unspecified: Secondary | ICD-10-CM | POA: Diagnosis not present

## 2021-10-08 LAB — POCT GLYCOSYLATED HEMOGLOBIN (HGB A1C): Hemoglobin A1C: 6.1 % — AB (ref 4.0–5.6)

## 2021-10-08 MED ORDER — ATORVASTATIN CALCIUM 40 MG PO TABS: 40.0000 mg | ORAL_TABLET | Freq: Every day | ORAL | 1 refills | Status: DC

## 2021-10-08 MED ORDER — VALSARTAN 80 MG PO TABS: 80.0000 mg | ORAL_TABLET | Freq: Every day | ORAL | 1 refills | Status: DC

## 2021-10-08 MED ORDER — SUMATRIPTAN SUCCINATE 25 MG PO TABS: 25.0000 mg | ORAL_TABLET | ORAL | 2 refills | Status: AC | PRN
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
## 2021-10-14 DIAGNOSIS — M25641 Stiffness of right hand, not elsewhere classified: Secondary | ICD-10-CM | POA: Diagnosis not present

## 2021-10-15 DIAGNOSIS — M7051 Other bursitis of knee, right knee: Secondary | ICD-10-CM | POA: Diagnosis not present

## 2021-10-15 DIAGNOSIS — M170 Bilateral primary osteoarthritis of knee: Secondary | ICD-10-CM | POA: Diagnosis not present

## 2021-10-15 DIAGNOSIS — M7052 Other bursitis of knee, left knee: Secondary | ICD-10-CM | POA: Diagnosis not present

## 2021-10-18 DIAGNOSIS — G4733 Obstructive sleep apnea (adult) (pediatric): Secondary | ICD-10-CM | POA: Diagnosis not present

## 2021-10-22 ENCOUNTER — Encounter: Payer: Self-pay | Admitting: Nurse Practitioner

## 2021-10-22 ENCOUNTER — Ambulatory Visit (INDEPENDENT_AMBULATORY_CARE_PROVIDER_SITE_OTHER): Payer: Medicaid Other | Admitting: Nurse Practitioner

## 2021-10-22 VITALS — BP 134/80 | Ht 65.0 in | Wt 242.0 lb

## 2021-10-22 DIAGNOSIS — Z853 Personal history of malignant neoplasm of breast: Secondary | ICD-10-CM

## 2021-10-22 DIAGNOSIS — Z01419 Encounter for gynecological examination (general) (routine) without abnormal findings: Secondary | ICD-10-CM | POA: Diagnosis not present

## 2021-10-22 DIAGNOSIS — M8588 Other specified disorders of bone density and structure, other site: Secondary | ICD-10-CM | POA: Diagnosis not present

## 2021-10-22 DIAGNOSIS — Z780 Asymptomatic menopausal state: Secondary | ICD-10-CM

## 2021-10-22 NOTE — Progress Notes (Signed)
Lauren Garrison Dec 17, 1960 [id number]   History:  61 y.o. G4 P4 presents for annual exam without GYN complaints. Postmenopausal - no HRT, no bleeding. Normal pap history. 2005 left breast cancer and again July 2021 ER+/PR+ managed with left mastectomy and chemotherapy (completed 06/2020), on Arimidex. PMB x 2 episodes - 06/2021 U/S showed thickened endometrium, EMB - benign inactive atrophic endometrium. Hysteroscopy, D&C & polypectomy 07/2021. HTN, T2DM managed by PCP.   Gynecologic History No LMP recorded. Patient is postmenopausal.   Sexually active: No  Health maintenance Last Pap: 08/03/2019 Results were: Normal Last mammogram (right only): 03/07/2021. Results were: Normal Last colonoscopy: 09/01/2019. Results were: Benign polyps Last Dexa: 03/26/2021. Results were: T-score -1.3  Past medical history, past surgical history, family history and social history were all reviewed and documented in the EPIC chart. Divorced. Moved to [place] 5 years ago. Working on disability approval. Has son and daughter in [place], son in [place], and one son in [place].   ROS:  A ROS was performed and pertinent positives and negatives are included.  Exam:  Vitals:   10/22/21 1446  BP: 134/80  Weight: 242 lb (109.8 kg)  Height: '5\' 5"'$  (1.651 m)   Body mass index is 40.27 kg/m.  General appearance:  Normal Thyroid:  Symmetrical, normal in size, without palpable masses or nodularity. Respiratory  Auscultation:  Clear without wheezing or rhonchi Cardiovascular  Auscultation:  Regular rate, without rubs, murmurs or gallops  Edema/varicosities:  Not grossly evident Abdominal  Soft,nontender, without masses, guarding or rebound.  Liver/spleen:  No organomegaly noted  Hernia:  None appreciated  Skin  Inspection:  Grossly normal   Breasts: Examined lying and sitting.   Right: Reduction. Scars are heal well. No masses, redness, swelling, nipple discharge, retractions  Left: Mastectomy Genitourinary    Inguinal/mons:  Normal without inguinal adenopathy  External genitalia:  Normal appearing vulva with no masses, tenderness, or lesions  BUS/Urethra/Skene's glands:  Normal  Vagina:  Normal appearing with normal color and discharge, no lesions  Cervix:  Normal appearing without discharge or lesions  Uterus:  Difficult to palpate due to body habitus but no gross masses or tenderness  Adnexa/parametria:     Rt: Without masses or tenderness.   Lt: Without masses or tenderness.  Anus and perineum: Normal, non-bleeding hemorrhoid  Digital rectal exam: Normal sphincter tone without palpated masses or tenderness  Patient informed chaperone available to be present for breast and pelvic exam. Patient has requested no chaperone to be present. Patient has been advised what will be completed during breast and pelvic exam.   Assessment/Plan:  61 y.o. G4 P4 presents as new patient to establish care.   Well female exam with routine gynecological exam - Education provided on SBEs, importance of preventative screenings, current guidelines, high calcium diet, regular exercise, and multivitamin daily. Labs done with PCP.  Postmenopausal - no HRT, no bleeding since hysteroscopy, D&C, and polypectomy 07/2021.   Osteopenia of spine - T-score -1.3. Recommend Vitamin D + Calcium and regular exercise.   History of left breast cancer - 2005 and again July 2021 T2N1, stage IIA, ER/PR+ managed with mastectomy, chemotherapy (completed 06/2020), on Arimidex. Being follow by oncology. UTD on screening mammogram (right only).   Screening for cervical cancer - Normal Pap history.  Will repeat at 5-year interval per guidelines.  Screening for colon cancer - 2021 colonoscopy. Will repeat at GI's recommended interval.   Follow up in 1 year for annual.      Dayan Kreis  [place], 3:22 PM 10/22/2021

## 2021-10-24 DIAGNOSIS — M25641 Stiffness of right hand, not elsewhere classified: Secondary | ICD-10-CM | POA: Diagnosis not present

## 2021-10-29 DIAGNOSIS — M25561 Pain in right knee: Secondary | ICD-10-CM | POA: Diagnosis not present

## 2021-10-29 DIAGNOSIS — M25562 Pain in left knee: Secondary | ICD-10-CM | POA: Diagnosis not present

## 2021-10-31 DIAGNOSIS — M25562 Pain in left knee: Secondary | ICD-10-CM | POA: Diagnosis not present

## 2021-10-31 DIAGNOSIS — M25561 Pain in right knee: Secondary | ICD-10-CM | POA: Diagnosis not present

## 2021-11-01 DIAGNOSIS — M25641 Stiffness of right hand, not elsewhere classified: Secondary | ICD-10-CM | POA: Diagnosis not present

## 2021-11-07 DIAGNOSIS — M25562 Pain in left knee: Secondary | ICD-10-CM | POA: Diagnosis not present

## 2021-11-07 DIAGNOSIS — M25561 Pain in right knee: Secondary | ICD-10-CM | POA: Diagnosis not present

## 2021-11-13 DIAGNOSIS — M25562 Pain in left knee: Secondary | ICD-10-CM | POA: Diagnosis not present

## 2021-11-13 DIAGNOSIS — M25561 Pain in right knee: Secondary | ICD-10-CM | POA: Diagnosis not present

## 2021-11-15 ENCOUNTER — Other Ambulatory Visit: Payer: Self-pay | Admitting: *Deleted

## 2021-11-15 DIAGNOSIS — M25561 Pain in right knee: Secondary | ICD-10-CM | POA: Diagnosis not present

## 2021-11-15 DIAGNOSIS — M25562 Pain in left knee: Secondary | ICD-10-CM | POA: Diagnosis not present

## 2021-11-15 NOTE — Patient Outreach (Signed)
  Medicaid Managed Care   Unsuccessful Attempt Note   11/15/2021 Name: Lauren Garrison MRN: [id number] DOB: [birth year]  Referred by: Isaac Bliss, Rayford Halsted, MD Reason for referral : High Risk Managed Medicaid (Unsuccessful RNCM initial outreach)   An unsuccessful telephone outreach was attempted today. The patient was referred to the case management team for assistance with care management and care coordination.    Follow Up Plan: A HIPAA compliant phone message was left for the patient providing contact information and requesting a return call.    [name] RN, BSN [place]  [employer] manager

## 2021-11-15 NOTE — Patient Instructions (Signed)
Visit Information  Ms. [name]  - as a part of your Medicaid benefit, you are eligible for care management and care coordination services at no cost or copay. I was unable to reach you by phone today but would be happy to help you with your health related needs. Please feel free to call me @ [phone].   A member of the Managed Medicaid care management team will reach out to you again over the next 14 days.   [name] RN, BSN [place]  [employer] manager

## 2021-11-17 DIAGNOSIS — G4733 Obstructive sleep apnea (adult) (pediatric): Secondary | ICD-10-CM | POA: Diagnosis not present

## 2021-11-18 ENCOUNTER — Telehealth: Payer: Self-pay | Admitting: Internal Medicine

## 2021-11-22 DIAGNOSIS — M170 Bilateral primary osteoarthritis of knee: Secondary | ICD-10-CM | POA: Diagnosis not present

## 2021-11-22 DIAGNOSIS — M1990 Unspecified osteoarthritis, unspecified site: Secondary | ICD-10-CM | POA: Diagnosis not present

## 2021-11-22 DIAGNOSIS — M7051 Other bursitis of knee, right knee: Secondary | ICD-10-CM | POA: Diagnosis not present

## 2021-11-22 DIAGNOSIS — M7052 Other bursitis of knee, left knee: Secondary | ICD-10-CM | POA: Diagnosis not present

## 2021-11-26 ENCOUNTER — Encounter (HOSPITAL_BASED_OUTPATIENT_CLINIC_OR_DEPARTMENT_OTHER): Payer: Self-pay

## 2021-11-26 ENCOUNTER — Ambulatory Visit (HOSPITAL_BASED_OUTPATIENT_CLINIC_OR_DEPARTMENT_OTHER): Admit: 2021-11-26 | Payer: Medicaid Other | Admitting: Orthopedic Surgery

## 2021-11-26 SURGERY — CARPAL TUNNEL RELEASE
Anesthesia: Monitor Anesthesia Care | Laterality: Left

## 2021-12-06 DIAGNOSIS — G4733 Obstructive sleep apnea (adult) (pediatric): Secondary | ICD-10-CM | POA: Diagnosis not present

## 2021-12-12 ENCOUNTER — Other Ambulatory Visit: Payer: Self-pay | Admitting: Internal Medicine

## 2021-12-18 ENCOUNTER — Other Ambulatory Visit: Payer: Self-pay | Admitting: *Deleted

## 2021-12-18 DIAGNOSIS — G4733 Obstructive sleep apnea (adult) (pediatric): Secondary | ICD-10-CM | POA: Diagnosis not present
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
## 2021-12-18 NOTE — Patient Instructions (Signed)
Visit Information  Lauren Garrison was given information about Medicaid Managed Care team care coordination services as a part of their [place] Medicaid benefit. Lauren Garrison verbally consented to engagement with the [facility] Managed Care team.   If you are experiencing a medical emergency, please call 911 or report to your local emergency department or urgent care.   If you have a non-emergency medical problem during routine business hours, please contact your provider's office and ask to speak with a nurse.   For questions related to your [facility], please call: [phone] or visit the homepage here: [email or web address]  If you would like to schedule transportation through your [facility], please call the following number at least 2 days in advance of your appointment: [phone]   Rides for urgent appointments can also be made after hours by calling Member Services.  Call the [place] at [phone], at any time, 24 hours a day, 7 days a week. If you are in danger or need immediate medical attention call 911.  If you would like help to quit smoking, call 1-800-QUIT-NOW 415 634 9023) OR Espaol: 1-855-Djelo-Ya (5-885-027-7412) o para ms informacin haga clic aqu or Text READY to 200-400 to register via text  Lauren Garrison,   Please see education materials related to managing pain provided by MyChart link.  Patient verbalizes understanding of instructions and care plan provided today and agrees to view in [place]. Active MyChart status and patient understanding of how to access instructions and care plan via MyChart confirmed with patient.     Telephone follow up appointment with Managed Medicaid care management team member scheduled for:02/19/21 @ 2:30pm  Lauren Joiner RN, BSN [place] RN Care  Coordinator   Following is a copy of your plan of care:  Care Plan : RN Care Manager Plan of Care  Updates made by Melissa Montane, RN since 12/18/2021 12:00 AM     Problem: Health Management needs related to Bilateral Knee Pain      Long-Range Goal: Development of Plan of Care to address Health Management needs related to Bilateral Knee Pain   Start Date: 12/18/2021  Expected End Date: 03/18/2022  Priority: High  Note:   Current Barriers:  Chronic Disease Management support and education needs related to Pain-Carpal Tunnel(left) and bilat knee Lauren Garrison is scheduled for left carpal tunnel surgery on 12/27/21. She continues to have bilateral knee pain after steroid injections. Her provider has recommended Visco injections, which are not covered by Medicaid. She has applied for disability and waiting on determination.  RNCM Clinical Goal(s):  Patient will verbalize understanding of plan for management of Bilateral Knee Pain as evidenced by patient reports attend all scheduled medical appointments: 12/27/21 for Carpal Tunnel Surgery and 01/16/22 with Oncology as evidenced by provider documentation        through collaboration with RN Care manager, provider, and care team.   Interventions: Inter-disciplinary care team collaboration (see longitudinal plan of care) Evaluation of current treatment plan related to  self management and patient's adherence to plan as established by provider Provided therapeutic listening   Pain(carpal tunnel-left and bilateral knee):  (Status: New goal.) Long Term Goal  Pain assessment performed Medications reviewed-updated in Epic Reviewed provider established plan for pain management; Discussed importance of adherence to all scheduled medical appointments; Counseled on the importance of reporting any/all new or changed pain symptoms or management strategies to pain management provider; Advised  patient to report to care team affect of pain on daily  activities; Reviewed with patient prescribed pharmacological and nonpharmacological pain relief strategies; Assessed social determinant of health barriers;  Advised patient to contact [place] [phone] for member benefits([employer] membership, free phone and reimbursement for vitamins, message and acupuncture)  Patient Goals/Self-Care Activities: Take medications as prescribed   Attend all scheduled provider appointments Call provider office for new concerns or questions  Contact [facility] Member Services for member benefits

## 2021-12-27 DIAGNOSIS — G5602 Carpal tunnel syndrome, left upper limb: Secondary | ICD-10-CM | POA: Diagnosis not present

## 2022-01-03 DIAGNOSIS — G4733 Obstructive sleep apnea (adult) (pediatric): Secondary | ICD-10-CM | POA: Diagnosis not present

## 2022-01-16 ENCOUNTER — Inpatient Hospital Stay: Payer: Medicaid Other | Admitting: Hematology and Oncology

## 2022-01-31 DIAGNOSIS — M79642 Pain in left hand: Secondary | ICD-10-CM | POA: Diagnosis not present

## 2022-02-03 DIAGNOSIS — M79642 Pain in left hand: Secondary | ICD-10-CM | POA: Diagnosis not present

## 2022-02-05 ENCOUNTER — Encounter: Payer: Self-pay | Admitting: *Deleted

## 2022-02-05 DIAGNOSIS — G4733 Obstructive sleep apnea (adult) (pediatric): Secondary | ICD-10-CM | POA: Diagnosis not present

## 2022-02-07 DIAGNOSIS — M79642 Pain in left hand: Secondary | ICD-10-CM | POA: Diagnosis not present

## 2022-02-13 DIAGNOSIS — M79642 Pain in left hand: Secondary | ICD-10-CM | POA: Diagnosis not present

## 2022-02-18 DIAGNOSIS — M79642 Pain in left hand: Secondary | ICD-10-CM | POA: Diagnosis not present

## 2022-02-19 ENCOUNTER — Other Ambulatory Visit: Payer: Medicaid Other | Admitting: *Deleted

## 2022-02-19 NOTE — Patient Instructions (Signed)
Visit Information  Lauren Garrison was given information about Medicaid Managed Care team care coordination services as a part of their [place] Medicaid benefit. Lauren Garrison verbally consented to engagement with the [facility] Managed Care team.   If you are experiencing a medical emergency, please call 911 or report to your local emergency department or urgent care.   If you have a non-emergency medical problem during routine business hours, please contact your provider's office and ask to speak with a nurse.   For questions related to your [facility], please call: [phone] or visit the homepage here: [email or web address]  If you would like to schedule transportation through your [facility], please call the following number at least 2 days in advance of your appointment: [phone]   Rides for urgent appointments can also be made after hours by calling Member Services.  Call the [place] at [phone], at any time, 24 hours a day, 7 days a week. If you are in danger or need immediate medical attention call 911.  If you would like help to quit smoking, call 1-800-QUIT-NOW 801-130-2034) OR Espaol: 1-855-Djelo-Ya (9-371-696-7893) o para ms informacin haga clic aqu or Text READY to 200-400 to register via text  Lauren Garrison,   Please see education materials related to Pain provided by MyChart link.  Patient verbalizes understanding of instructions and care plan provided today and agrees to view in [place]. Active MyChart status and patient understanding of how to access instructions and care plan via MyChart confirmed with patient.     Telephone follow up appointment with Managed Medicaid care management team member scheduled for:04/23/22 @ 1:15pm  Lauren Joiner RN, BSN [place] RN Care  Coordinator   Following is a copy of your plan of care:  Care Plan : RN Care Manager Plan of Care  Updates made by Melissa Montane, RN since 02/19/2022 12:00 AM     Problem: Health Management needs related to Bilateral Knee Pain      Long-Range Goal: Development of Plan of Care to address Health Management needs related to Bilateral Knee Pain   Start Date: 12/18/2021  Expected End Date: 05/16/2022  Priority: High  Note:   Current Barriers:  Chronic Disease Management support and education needs related to Pain-Carpal Tunnel(left) and bilat knee Lauren Garrison is scheduled for left carpal tunnel surgery on 12/27/21. She continues to have bilateral knee pain after steroid injections. She has applied for disability and waiting on determination.  RNCM Clinical Goal(s):  Patient will verbalize understanding of plan for management of Bilateral Knee Pain as evidenced by patient reports attend all scheduled medical appointments: PT weekly and Mammogram 03/13/22 as evidenced by provider documentation        through collaboration with RN Care manager, provider, and care team.   Interventions: Inter-disciplinary care team collaboration (see longitudinal plan of care) Evaluation of current treatment plan related to  self management and patient's adherence to plan as established by provider Provided therapeutic listening  Pain(carpal tunnel-left and bilateral knee):  (Status: Goal on Track (progressing): YES.) Long Term Goal  Pain assessment performed-hand 5/10, knee pain 7-8/10 Medications reviewed-updated in Epic Reviewed provider established plan for pain management; Discussed importance of adherence to all scheduled medical appointments; Counseled on the importance of reporting any/all new or changed pain symptoms or management strategies to pain management provider; Advised patient to report to care team affect of pain on daily  activities; Reviewed with patient prescribed pharmacological and  nonpharmacological pain relief strategies; Assessed social determinant of health barriers;  Advised patient to contact [place] [phone] for dental provider Advised patient to attend all PT for left hand, discuss any concerns with provider  Patient Goals/Self-Care Activities: Take medications as prescribed   Attend all scheduled provider appointments Call provider office for new concerns or questions  Contact [facility] Member Services for member benefits
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
## 2022-02-27 DIAGNOSIS — M79642 Pain in left hand: Secondary | ICD-10-CM | POA: Diagnosis not present

## 2022-03-06 DIAGNOSIS — M79642 Pain in left hand: Secondary | ICD-10-CM | POA: Diagnosis not present

## 2022-03-13 ENCOUNTER — Ambulatory Visit
Admission: RE | Admit: 2022-03-13 | Discharge: 2022-03-13 | Disposition: A | Discharge status: Home / Self Care | Payer: Medicaid Other | Source: Ambulatory Visit | Attending: Hematology and Oncology | Admitting: Hematology and Oncology

## 2022-03-13 DIAGNOSIS — Z170 Estrogen receptor positive status [ER+]: Secondary | ICD-10-CM

## 2022-03-13 DIAGNOSIS — M79642 Pain in left hand: Secondary | ICD-10-CM | POA: Diagnosis not present

## 2022-03-25 ENCOUNTER — Ambulatory Visit (INDEPENDENT_AMBULATORY_CARE_PROVIDER_SITE_OTHER): Payer: Medicare (Managed Care) | Admitting: Internal Medicine

## 2022-03-25 ENCOUNTER — Encounter: Payer: Self-pay | Admitting: Internal Medicine

## 2022-03-25 VITALS — BP 120/80 | HR 80 | Temp 97.8°F | Ht 64.0 in | Wt 237.0 lb

## 2022-03-25 DIAGNOSIS — Z0000 Encounter for general adult medical examination without abnormal findings: Secondary | ICD-10-CM

## 2022-03-25 DIAGNOSIS — I10 Essential (primary) hypertension: Secondary | ICD-10-CM

## 2022-03-25 DIAGNOSIS — E1169 Type 2 diabetes mellitus with other specified complication: Secondary | ICD-10-CM

## 2022-03-25 DIAGNOSIS — E538 Deficiency of other specified B group vitamins: Secondary | ICD-10-CM

## 2022-03-25 DIAGNOSIS — E785 Hyperlipidemia, unspecified: Secondary | ICD-10-CM

## 2022-03-25 DIAGNOSIS — E559 Vitamin D deficiency, unspecified: Secondary | ICD-10-CM

## 2022-03-25 NOTE — Patient Instructions (Signed)
-  Nice seeing you today!!  -Lab work today; will notify you once results are available.  -Remember your flu, COVID , RSV and shingles vaccines at the pharmacy.  -Schedule follow up in 6 months.
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
## 2022-03-28 ENCOUNTER — Other Ambulatory Visit (INDEPENDENT_AMBULATORY_CARE_PROVIDER_SITE_OTHER): Payer: Medicare (Managed Care)

## 2022-03-28 DIAGNOSIS — E538 Deficiency of other specified B group vitamins: Secondary | ICD-10-CM

## 2022-03-28 DIAGNOSIS — E559 Vitamin D deficiency, unspecified: Secondary | ICD-10-CM | POA: Diagnosis not present

## 2022-03-28 DIAGNOSIS — E785 Hyperlipidemia, unspecified: Secondary | ICD-10-CM | POA: Diagnosis not present

## 2022-03-28 DIAGNOSIS — E1169 Type 2 diabetes mellitus with other specified complication: Secondary | ICD-10-CM

## 2022-03-28 DIAGNOSIS — I10 Essential (primary) hypertension: Secondary | ICD-10-CM | POA: Diagnosis not present

## 2022-03-28 LAB — COMPREHENSIVE METABOLIC PANEL
ALT: 17 U/L (ref 0–35)
AST: 14 U/L (ref 0–37)
Albumin: 4 g/dL (ref 3.5–5.2)
Alkaline Phosphatase: 105 U/L (ref 39–117)
BUN: 15 mg/dL (ref 6–23)
CO2: 30 mEq/L (ref 19–32)
Calcium: 9.4 mg/dL (ref 8.4–10.5)
Chloride: 102 mEq/L (ref 96–112)
Creatinine, Ser: 0.9 mg/dL (ref 0.40–1.20)
GFR: 69.16 mL/min (ref >60.00)
Glucose, Bld: 107 mg/dL — ABNORMAL HIGH (ref 70–99)
Potassium: 4.1 mEq/L (ref 3.5–5.1)
Sodium: 142 mEq/L (ref 135–145)
Total Bilirubin: 0.3 mg/dL (ref 0.2–1.2)
Total Protein: 7.3 g/dL (ref 6.0–8.3)

## 2022-03-28 LAB — MICROALBUMIN / CREATININE URINE RATIO
Creatinine,U: 64.2 mg/dL
Microalb Creat Ratio: 1.1 mg/g (ref 0.0–30.0)
Microalb, Ur: <0.7 mg/dL (ref 0.0–1.9)

## 2022-03-28 LAB — LIPID PANEL
Cholesterol: 252 mg/dL — ABNORMAL HIGH (ref 0–200)
HDL: 42.5 mg/dL (ref >39.00)
LDL Cholesterol: 187 mg/dL — ABNORMAL HIGH (ref 0–99)
NonHDL: 209.88
Total CHOL/HDL Ratio: 6
Triglycerides: 115 mg/dL (ref 0.0–149.0)
VLDL: 23 mg/dL (ref 0.0–40.0)

## 2022-03-28 LAB — CBC WITH DIFFERENTIAL/PLATELET
Basophils Absolute: 0 10*3/uL (ref 0.0–0.1)
Basophils Relative: 0.8 % (ref 0.0–3.0)
Eosinophils Absolute: 0.1 10*3/uL (ref 0.0–0.7)
Eosinophils Relative: 1.4 % (ref 0.0–5.0)
HCT: 40.4 % (ref 36.0–46.0)
Hemoglobin: 13.3 g/dL (ref 12.0–15.0)
Lymphocytes Relative: 42.7 % (ref 12.0–46.0)
Lymphs Abs: 2.2 10*3/uL (ref 0.7–4.0)
MCHC: 33.1 g/dL (ref 30.0–36.0)
MCV: 88.3 fl (ref 78.0–100.0)
Monocytes Absolute: 0.4 10*3/uL (ref 0.1–1.0)
Monocytes Relative: 7 % (ref 3.0–12.0)
Neutro Abs: 2.5 10*3/uL (ref 1.4–7.7)
Neutrophils Relative %: 48.1 % (ref 43.0–77.0)
Platelets: 219 10*3/uL (ref 150.0–400.0)
RBC: 4.57 Mil/uL (ref 3.87–5.11)
RDW: 14.2 % (ref 11.5–15.5)
WBC: 5.1 10*3/uL (ref 4.0–10.5)

## 2022-03-28 LAB — VITAMIN D 25 HYDROXY (VIT D DEFICIENCY, FRACTURES): VITD: 13.87 ng/mL — ABNORMAL LOW (ref 30.00–100.00)

## 2022-03-28 LAB — VITAMIN B12: Vitamin B-12: 417 pg/mL (ref 211–911)

## 2022-03-28 LAB — HEMOGLOBIN A1C: Hgb A1c MFr Bld: 6.5 % (ref 4.6–6.5)

## 2022-03-29 IMAGING — US US BREAST BX W LOC DEV 1ST LESION IMG BX SPEC US GUIDE*L*
1 series · 12 of 18 positions shown · non-contrast
Comparison: Previous exam(s).
COMPARISON: Previous exam(s).

Addendum:
CLINICAL DATA: Biopsy of a left breast mass at 2 o'clock and a left
axillary node.

EXAM:
ULTRASOUND GUIDED LEFT BREAST CORE NEEDLE BIOPSY

[Series 1: us breast bx w loc dev 1st lesion img bx spec us g · 0.07mm/px · 12 of 18 slices shown]
[im 1/18]
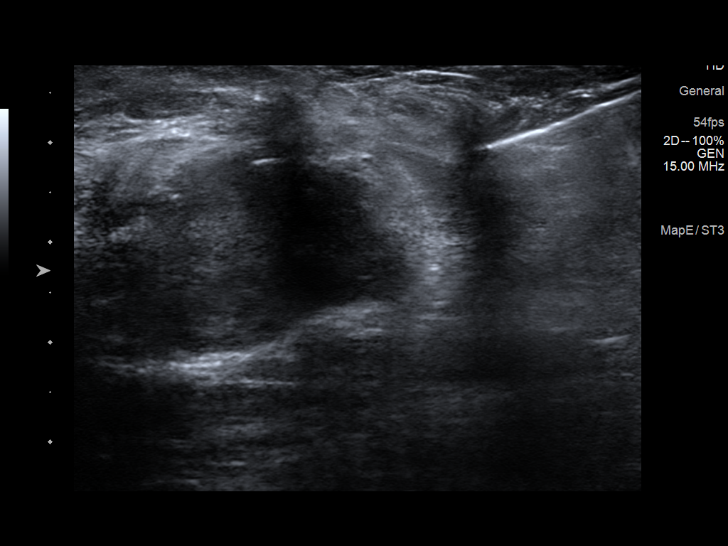
[im 3/18]
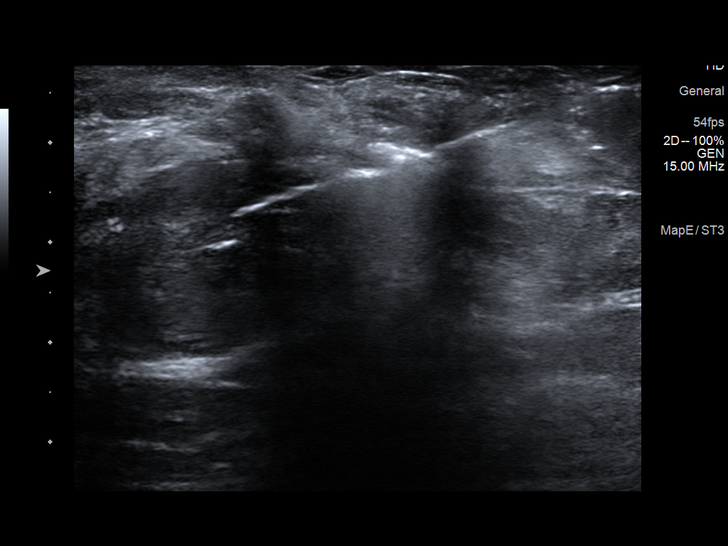
[im 4/18]
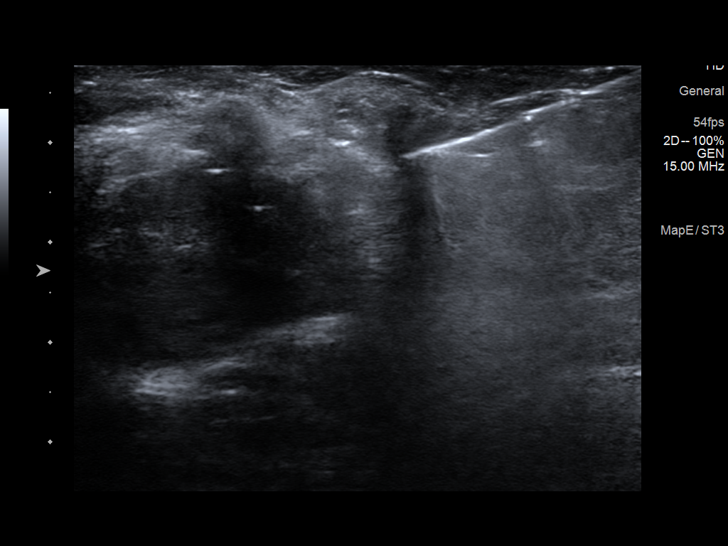
[im 6/18]
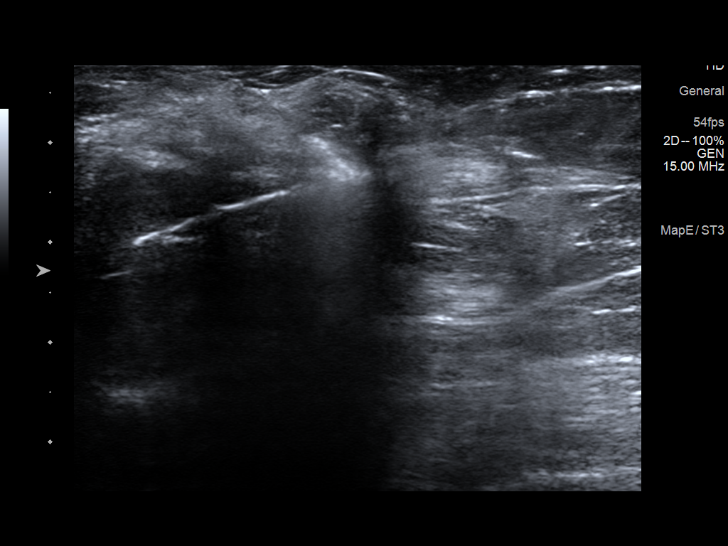
[im 7/18]
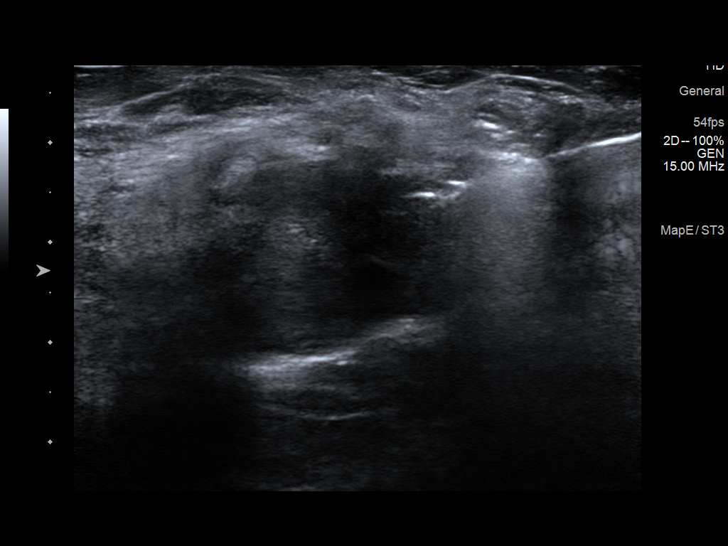
[im 9/18]
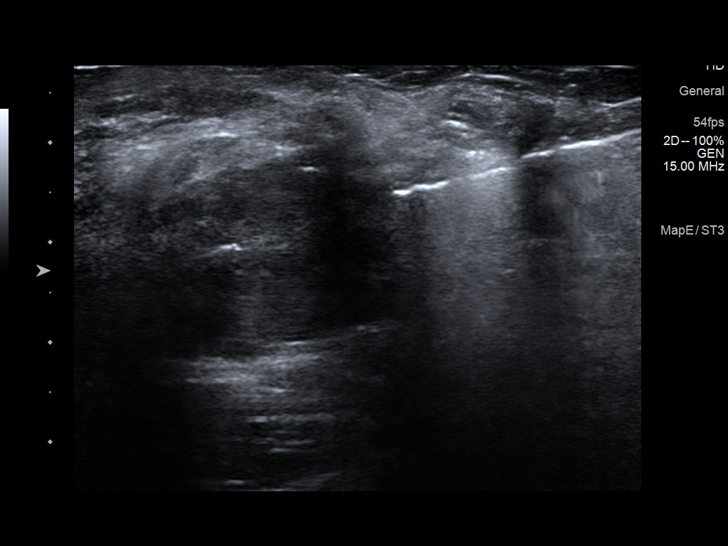
[im 10/18]
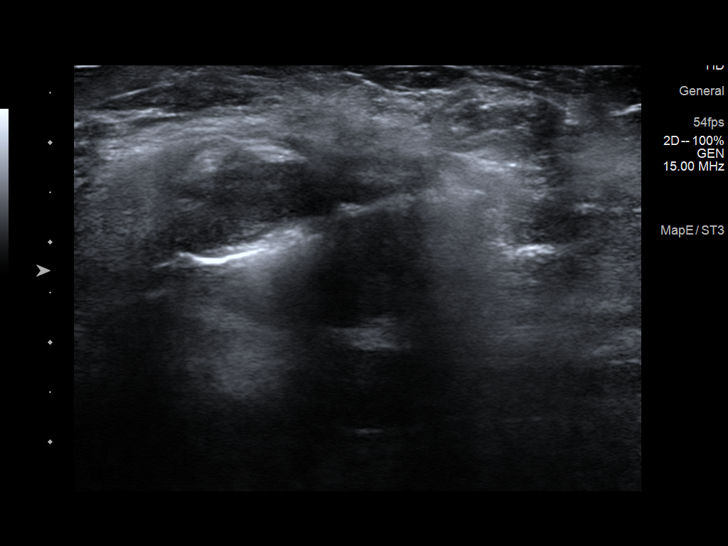
[im 12/18]
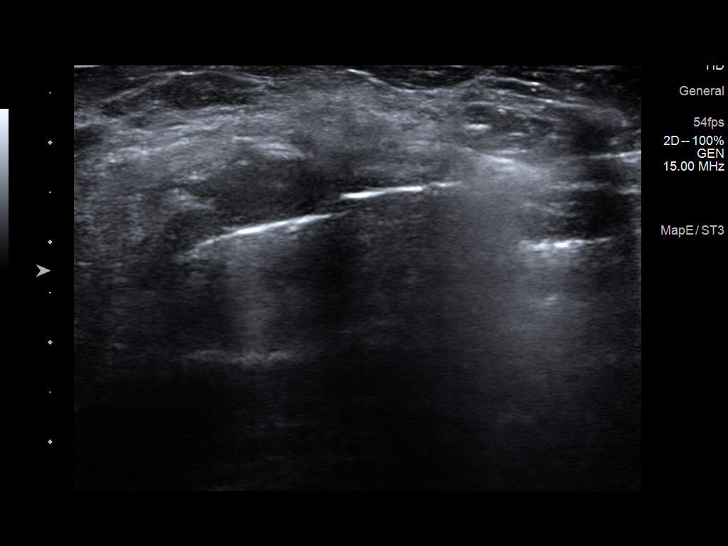
[im 13/18]
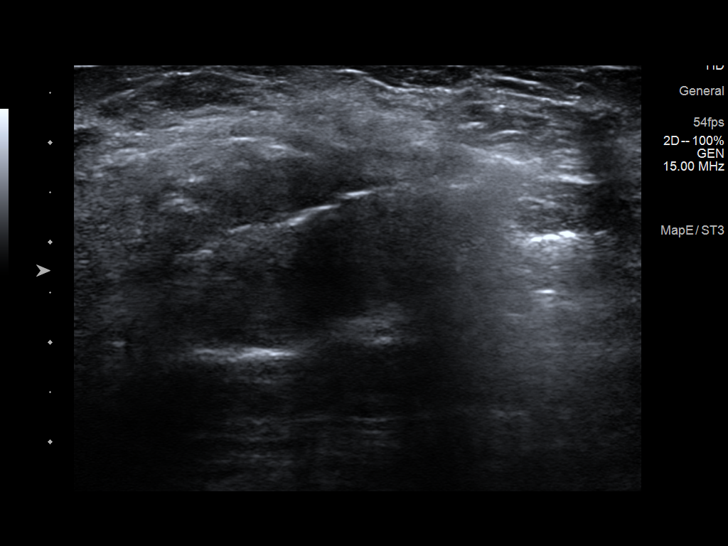
[im 15/18]
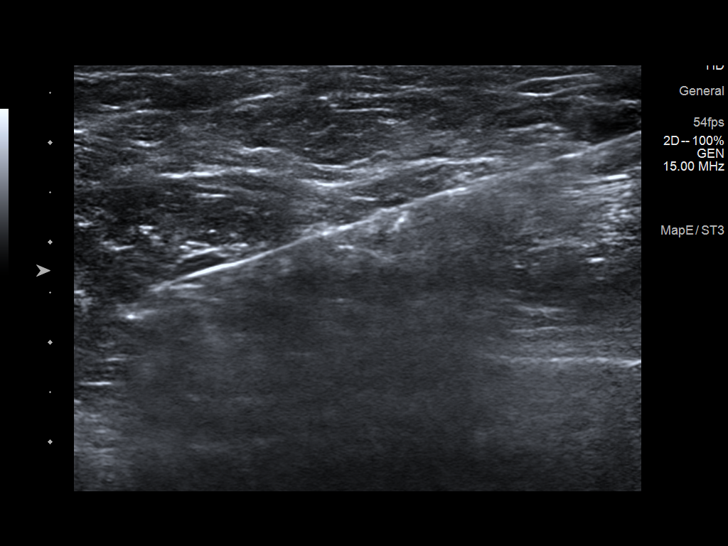
[im 16/18]
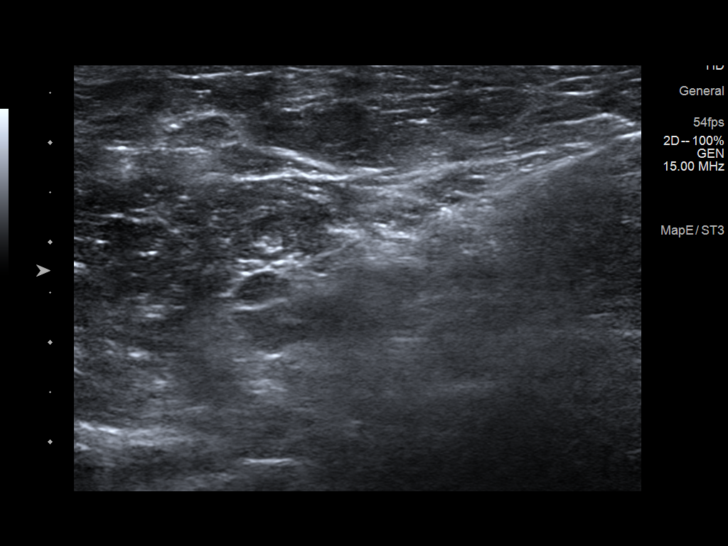
[im 18/18]
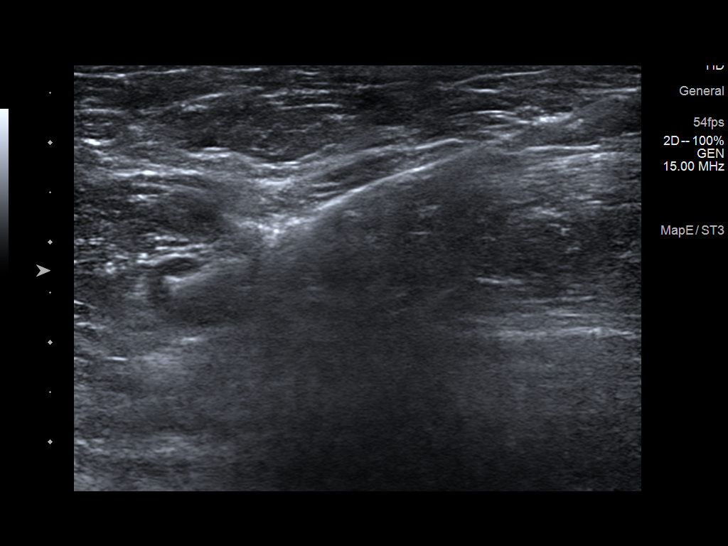

[12 of 18 positions shown; findings below may reference images not displayed]



Lesion quadrant: Left breast 2 o'clock

Using sterile technique and 1% Lidocaine as local anesthetic, under
direct ultrasound visualization, a 12 gauge Lj device was
used to perform biopsy of a 2 o'clock left breast mass using a
lateral approach. At the conclusion of the procedure ribbon shaped
tissue marker clip was deployed into the biopsy cavity. Follow up 2
view mammogram was performed and dictated separately.

Lesion quadrant: Left axilla

Using sterile technique and 1% Lidocaine as local anesthetic, under
direct ultrasound visualization, a 14 gauge Lj device was
used to perform biopsy of an abnormal left axillary node using a
lateral approach. At the conclusion of the procedure a Q shaped
tissue marker clip was deployed into the biopsy cavity. Follow up 2
view mammogram was performed and dictated separately.
IMPRESSION: Ultrasound guided biopsy of a left breast mass at 2 o'clock and an
abnormal left axillary node. No apparent complications.

ADDENDUM:
Pathology revealed GRADE II INVASIVE MAMMARY CARCINOMA of the LEFT
breast, 2 o'clock. This was found to be concordant by Dr. Jim
[place].

Pathology revealed INVASIVE MAMMARY CARCINOMA of the LEFT axillary
lymph node. No lymph node tissue is seen. This may represent a lymph
node entirely replaced by metastatic tumor. This was found to be
concordant by Dr. Jayquan Uhl.

Pathology results were discussed with the patient by telephone. The
patient reported doing well after the biopsies with tenderness at
the sites. Post biopsy instructions and care were reviewed and
questions were answered. The patient was encouraged to call The

The patient was referred to [REDACTED]
[REDACTED] at [REDACTED] on
August 31, 2019.

Pathology results reported by Frassman Torres Rosell RN on 08/25/2019.



Lesion quadrant: Left breast 2 o'clock

Using sterile technique and 1% Lidocaine as local anesthetic, under
direct ultrasound visualization, a 12 gauge Lj device was
used to perform biopsy of a 2 o'clock left breast mass using a
lateral approach. At the conclusion of the procedure ribbon shaped
tissue marker clip was deployed into the biopsy cavity. Follow up 2
view mammogram was performed and dictated separately.

Lesion quadrant: Left axilla

Using sterile technique and 1% Lidocaine as local anesthetic, under
direct ultrasound visualization, a 14 gauge Lj device was
used to perform biopsy of an abnormal left axillary node using a
lateral approach. At the conclusion of the procedure a Q shaped
tissue marker clip was deployed into the biopsy cavity. Follow up 2
view mammogram was performed and dictated separately.
IMPRESSION: Ultrasound guided biopsy of a left breast mass at 2 o'clock and an
abnormal left axillary node. No apparent complications.

## 2022-03-31 ENCOUNTER — Other Ambulatory Visit: Payer: Self-pay | Admitting: Internal Medicine

## 2022-03-31 DIAGNOSIS — E559 Vitamin D deficiency, unspecified: Secondary | ICD-10-CM

## 2022-03-31 DIAGNOSIS — E1169 Type 2 diabetes mellitus with other specified complication: Secondary | ICD-10-CM

## 2022-03-31 MED ORDER — ATORVASTATIN CALCIUM 40 MG PO TABS
40.0000 mg | ORAL_TABLET | Freq: Every day | ORAL | 1 refills | Status: DC
Start: 2022-03-31 — End: 2022-04-28

## 2022-03-31 MED ORDER — VITAMIN D (ERGOCALCIFEROL) 1.25 MG (50000 UNIT) PO CAPS: 50000.0000 [IU] | ORAL_CAPSULE | ORAL | 0 refills | Status: AC

## 2022-04-10 DIAGNOSIS — M79642 Pain in left hand: Secondary | ICD-10-CM | POA: Diagnosis not present

## 2022-04-23 ENCOUNTER — Other Ambulatory Visit: Payer: Medicaid Other | Admitting: *Deleted

## 2022-04-23 ENCOUNTER — Encounter: Payer: Self-pay | Admitting: *Deleted

## 2022-04-23 NOTE — Patient Outreach (Signed)
   Embedded Care Coordination  Case Closure Note  04/23/2022 Name: Evelynne Godar MRN: [id number] DOB: 1960-10-01  Davon Seydel is a 62 y.o. year old female who is a primary care patient of Isaac Bliss, Rayford Halsted, [place] team was consulted for assistance with chronic disease management and care coordination needs related to  Pain  The patient has met all care management goals, agreed to case closure, and has been provided with contact information for the care management team. Appropriate care team members and provider have been notified via electronic communication. The care management team is available to provide care management/care coordination support at any time in the future should needs arise. Further engagement requires referral order [id number]).   If patient returns call to provider office and is in need of assistance from the embedded care coordination team, please advise that the patient call the [facility] at [phone] for assistance.   [name] RN, BSN [place] RN Care Coordinator [phone]

## 2022-04-28 ENCOUNTER — Inpatient Hospital Stay: Payer: Medicare (Managed Care) | Attending: Hematology and Oncology | Admitting: Hematology and Oncology

## 2022-04-28 VITALS — BP 156/76 | HR 88 | Temp 97.2°F | Resp 18 | Ht 64.0 in | Wt 247.6 lb

## 2022-04-28 DIAGNOSIS — C50412 Malignant neoplasm of upper-outer quadrant of left female breast: Secondary | ICD-10-CM | POA: Diagnosis not present

## 2022-04-28 DIAGNOSIS — M8580 Other specified disorders of bone density and structure, unspecified site: Secondary | ICD-10-CM | POA: Diagnosis not present

## 2022-04-28 DIAGNOSIS — Z79811 Long term (current) use of aromatase inhibitors: Secondary | ICD-10-CM | POA: Diagnosis not present

## 2022-04-28 DIAGNOSIS — Z170 Estrogen receptor positive status [ER+]: Secondary | ICD-10-CM | POA: Insufficient documentation
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
## 2022-05-06 ENCOUNTER — Other Ambulatory Visit: Payer: Self-pay | Admitting: Internal Medicine

## 2022-05-06 ENCOUNTER — Ambulatory Visit: Payer: Medicare (Managed Care)

## 2022-05-06 ENCOUNTER — Other Ambulatory Visit (HOSPITAL_COMMUNITY): Payer: Self-pay

## 2022-05-06 MED ORDER — DICLOFENAC SODIUM 1 % EX GEL
4.0000 g | Freq: Four times a day (QID) | CUTANEOUS | 1 refills | Status: AC
  Filled 2022-05-06: qty 100, 6d supply, fill #0

## 2022-05-07 ENCOUNTER — Other Ambulatory Visit (HOSPITAL_COMMUNITY): Payer: Self-pay

## 2022-05-21 ENCOUNTER — Ambulatory Visit: Payer: Medicare (Managed Care)

## 2022-05-29 ENCOUNTER — Ambulatory Visit: Payer: Medicare (Managed Care) | Attending: Hematology and Oncology

## 2022-05-29 ENCOUNTER — Other Ambulatory Visit: Payer: Self-pay

## 2022-05-29 DIAGNOSIS — M25512 Pain in left shoulder: Secondary | ICD-10-CM | POA: Diagnosis not present

## 2022-05-29 DIAGNOSIS — M25612 Stiffness of left shoulder, not elsewhere classified: Secondary | ICD-10-CM | POA: Diagnosis not present

## 2022-05-29 DIAGNOSIS — Z170 Estrogen receptor positive status [ER+]: Secondary | ICD-10-CM | POA: Diagnosis not present

## 2022-05-29 DIAGNOSIS — Z483 Aftercare following surgery for neoplasm: Secondary | ICD-10-CM

## 2022-05-29 DIAGNOSIS — C50412 Malignant neoplasm of upper-outer quadrant of left female breast: Secondary | ICD-10-CM | POA: Diagnosis not present

## 2022-05-29 DIAGNOSIS — I890 Lymphedema, not elsewhere classified: Secondary | ICD-10-CM
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
## 2022-06-06 ENCOUNTER — Ambulatory Visit: Payer: Medicare (Managed Care)

## 2022-06-06 DIAGNOSIS — Z483 Aftercare following surgery for neoplasm: Secondary | ICD-10-CM | POA: Diagnosis not present

## 2022-06-06 DIAGNOSIS — I890 Lymphedema, not elsewhere classified: Secondary | ICD-10-CM

## 2022-06-06 DIAGNOSIS — C50412 Malignant neoplasm of upper-outer quadrant of left female breast: Secondary | ICD-10-CM

## 2022-06-06 DIAGNOSIS — M25512 Pain in left shoulder: Secondary | ICD-10-CM

## 2022-06-06 DIAGNOSIS — M25612 Stiffness of left shoulder, not elsewhere classified: Secondary | ICD-10-CM
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
## 2022-06-09 ENCOUNTER — Ambulatory Visit: Payer: Medicare (Managed Care)

## 2022-06-09 DIAGNOSIS — M25512 Pain in left shoulder: Secondary | ICD-10-CM

## 2022-06-09 DIAGNOSIS — Z483 Aftercare following surgery for neoplasm: Secondary | ICD-10-CM | POA: Diagnosis not present

## 2022-06-09 DIAGNOSIS — I890 Lymphedema, not elsewhere classified: Secondary | ICD-10-CM

## 2022-06-09 DIAGNOSIS — C50412 Malignant neoplasm of upper-outer quadrant of left female breast: Secondary | ICD-10-CM

## 2022-06-09 DIAGNOSIS — M25612 Stiffness of left shoulder, not elsewhere classified: Secondary | ICD-10-CM
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
## 2022-06-11 ENCOUNTER — Ambulatory Visit: Payer: Medicare (Managed Care)

## 2022-06-11 DIAGNOSIS — Z483 Aftercare following surgery for neoplasm: Secondary | ICD-10-CM | POA: Diagnosis not present

## 2022-06-11 DIAGNOSIS — M25612 Stiffness of left shoulder, not elsewhere classified: Secondary | ICD-10-CM

## 2022-06-11 DIAGNOSIS — I890 Lymphedema, not elsewhere classified: Secondary | ICD-10-CM

## 2022-06-11 DIAGNOSIS — C50412 Malignant neoplasm of upper-outer quadrant of left female breast: Secondary | ICD-10-CM
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
## 2022-06-13 ENCOUNTER — Ambulatory Visit: Payer: Medicare (Managed Care) | Admitting: Physical Therapy

## 2022-06-23 ENCOUNTER — Ambulatory Visit: Payer: Medicare (Managed Care) | Attending: Hematology and Oncology

## 2022-06-23 DIAGNOSIS — Z483 Aftercare following surgery for neoplasm: Secondary | ICD-10-CM | POA: Insufficient documentation

## 2022-06-23 DIAGNOSIS — I890 Lymphedema, not elsewhere classified: Secondary | ICD-10-CM | POA: Diagnosis not present

## 2022-06-23 DIAGNOSIS — R293 Abnormal posture: Secondary | ICD-10-CM | POA: Insufficient documentation

## 2022-06-23 DIAGNOSIS — M25611 Stiffness of right shoulder, not elsewhere classified: Secondary | ICD-10-CM | POA: Insufficient documentation

## 2022-06-23 DIAGNOSIS — M25512 Pain in left shoulder: Secondary | ICD-10-CM | POA: Diagnosis not present

## 2022-06-23 DIAGNOSIS — M6281 Muscle weakness (generalized): Secondary | ICD-10-CM | POA: Insufficient documentation

## 2022-06-23 DIAGNOSIS — M25612 Stiffness of left shoulder, not elsewhere classified: Secondary | ICD-10-CM | POA: Diagnosis not present

## 2022-06-23 DIAGNOSIS — R2230 Localized swelling, mass and lump, unspecified upper limb: Secondary | ICD-10-CM | POA: Diagnosis not present

## 2022-06-23 DIAGNOSIS — C50412 Malignant neoplasm of upper-outer quadrant of left female breast: Secondary | ICD-10-CM
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
## 2022-06-25 ENCOUNTER — Ambulatory Visit: Payer: Medicare (Managed Care)

## 2022-06-25 DIAGNOSIS — C50412 Malignant neoplasm of upper-outer quadrant of left female breast: Secondary | ICD-10-CM | POA: Diagnosis not present

## 2022-06-25 DIAGNOSIS — M25612 Stiffness of left shoulder, not elsewhere classified: Secondary | ICD-10-CM

## 2022-06-25 DIAGNOSIS — Z483 Aftercare following surgery for neoplasm: Secondary | ICD-10-CM

## 2022-06-25 DIAGNOSIS — I890 Lymphedema, not elsewhere classified: Secondary | ICD-10-CM

## 2022-06-25 NOTE — Patient Instructions (Signed)
Over Head Pull: Narrow and Wide Grip   Cancer Rehab [phone]   On back, knees bent, feet flat, band across thighs, elbows straight but relaxed. Pull hands apart (start). Keeping elbows straight, bring arms up and over head, hands toward floor. Keep pull steady on band. Hold momentarily. Return slowly, keeping pull steady, back to start. Then do same with a wider grip on the band (past shoulder width) Repeat _10__ times. Band color __yellow____   Side Pull: Double Arm   On back, knees bent, feet flat. Arms perpendicular to body, shoulder level, elbows straight but relaxed. Pull arms out to sides, elbows straight. Resistance band comes across collarbones, hands toward floor. Hold momentarily. Slowly return to starting position. Repeat _10__ times. Band color _yellow____   Sword   On back, knees bent, feet flat, left hand on left hip, right hand above left. Pull right arm DIAGONALLY (hip to shoulder) across chest. Bring right arm along head toward floor. Hold momentarily. Slowly return to starting position. Repeat _5__ times. Do with left arm. Band color _yellow_____   Shoulder Rotation: Double Arm   On back, knees bent, feet flat, elbows tucked at sides, bent 90, hands palms up. Pull hands apart and down toward floor, keeping elbows near sides. Hold momentarily. Slowly return to starting position. Repeat 10__ times. Band color __yellow____
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
## 2022-07-03 ENCOUNTER — Ambulatory Visit: Payer: Medicare (Managed Care)

## 2022-07-09 ENCOUNTER — Ambulatory Visit: Payer: Medicare (Managed Care)

## 2022-07-09 DIAGNOSIS — C50412 Malignant neoplasm of upper-outer quadrant of left female breast: Secondary | ICD-10-CM | POA: Diagnosis not present

## 2022-07-09 DIAGNOSIS — I890 Lymphedema, not elsewhere classified: Secondary | ICD-10-CM

## 2022-07-09 DIAGNOSIS — Z483 Aftercare following surgery for neoplasm: Secondary | ICD-10-CM

## 2022-07-09 DIAGNOSIS — M25612 Stiffness of left shoulder, not elsewhere classified: Secondary | ICD-10-CM
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
## 2022-07-11 ENCOUNTER — Encounter: Payer: Self-pay | Admitting: Physical Therapy

## 2022-07-11 ENCOUNTER — Ambulatory Visit: Payer: Medicare (Managed Care) | Admitting: Physical Therapy

## 2022-07-11 DIAGNOSIS — C50412 Malignant neoplasm of upper-outer quadrant of left female breast: Secondary | ICD-10-CM

## 2022-07-11 DIAGNOSIS — M25612 Stiffness of left shoulder, not elsewhere classified: Secondary | ICD-10-CM

## 2022-07-11 DIAGNOSIS — R2230 Localized swelling, mass and lump, unspecified upper limb: Secondary | ICD-10-CM

## 2022-07-11 DIAGNOSIS — M25512 Pain in left shoulder: Secondary | ICD-10-CM

## 2022-07-11 DIAGNOSIS — Z483 Aftercare following surgery for neoplasm: Secondary | ICD-10-CM

## 2022-07-11 DIAGNOSIS — R293 Abnormal posture: Secondary | ICD-10-CM

## 2022-07-11 DIAGNOSIS — M25611 Stiffness of right shoulder, not elsewhere classified: Secondary | ICD-10-CM

## 2022-07-11 DIAGNOSIS — M6281 Muscle weakness (generalized): Secondary | ICD-10-CM

## 2022-07-11 DIAGNOSIS — I890 Lymphedema, not elsewhere classified: Secondary | ICD-10-CM

## 2022-07-16 ENCOUNTER — Ambulatory Visit: Payer: Medicare (Managed Care)
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
## 2022-07-18 ENCOUNTER — Encounter: Payer: Self-pay | Admitting: Physical Therapy

## 2022-07-18 ENCOUNTER — Ambulatory Visit: Payer: Medicare (Managed Care) | Admitting: Physical Therapy

## 2022-07-18 DIAGNOSIS — C50412 Malignant neoplasm of upper-outer quadrant of left female breast: Secondary | ICD-10-CM | POA: Diagnosis not present

## 2022-07-18 DIAGNOSIS — M25512 Pain in left shoulder: Secondary | ICD-10-CM

## 2022-07-18 DIAGNOSIS — Z483 Aftercare following surgery for neoplasm: Secondary | ICD-10-CM

## 2022-07-18 DIAGNOSIS — R293 Abnormal posture: Secondary | ICD-10-CM

## 2022-07-18 DIAGNOSIS — M25612 Stiffness of left shoulder, not elsewhere classified: Secondary | ICD-10-CM

## 2022-07-23 DIAGNOSIS — M65312 Trigger thumb, left thumb: Secondary | ICD-10-CM | POA: Diagnosis not present

## 2022-07-25 ENCOUNTER — Ambulatory Visit: Payer: Medicare (Managed Care) | Admitting: Physical Therapy

## 2022-08-05 DIAGNOSIS — C50412 Malignant neoplasm of upper-outer quadrant of left female breast: Secondary | ICD-10-CM | POA: Diagnosis not present

## 2022-11-06 ENCOUNTER — Encounter: Payer: Self-pay | Admitting: Nurse Practitioner

## 2022-11-06 ENCOUNTER — Ambulatory Visit: Payer: Medicare (Managed Care) | Admitting: Nurse Practitioner

## 2022-11-06 VITALS — BP 124/74 | HR 68 | Ht 64.0 in

## 2022-11-06 DIAGNOSIS — Z780 Asymptomatic menopausal state: Secondary | ICD-10-CM | POA: Diagnosis not present

## 2022-11-06 DIAGNOSIS — Z01419 Encounter for gynecological examination (general) (routine) without abnormal findings: Secondary | ICD-10-CM

## 2022-11-06 DIAGNOSIS — N760 Acute vaginitis: Secondary | ICD-10-CM | POA: Diagnosis not present

## 2022-11-06 DIAGNOSIS — M8588 Other specified disorders of bone density and structure, other site: Secondary | ICD-10-CM

## 2022-11-06 MED ORDER — FLUCONAZOLE 150 MG PO TABS
150.0000 mg | ORAL_TABLET | ORAL | 0 refills | Status: DC
Start: 2022-11-06 — End: 2023-11-16

## 2022-11-06 NOTE — Progress Notes (Signed)
Lauren Garrison 06/02/1960 [id number]   History:  62 y.o. G4 P4 presents for annual exam without GYN complaints. Postmenopausal - no HRT, no bleeding. Normal pap history. 2005 left breast cancer and again July 2021 ER+/PR+ managed with left mastectomy and chemotherapy (completed 06/2020), on Arimidex. PMB x 2 episodes - 06/2021 U/S showed thickened endometrium, EMB - benign inactive atrophic endometrium. Hysteroscopy, D&C & polypectomy 07/2021. No bleeding since. HTN, T2DM managed by PCP. Taking amoxicillin for tooth infection. Worried about getting a yeast infection.   Gynecologic History No LMP recorded. Patient is postmenopausal.   Sexually active: No  Health maintenance Last Pap: 08/03/2019 Results were: Normal neg HPV, 5-year repeat Last mammogram (right only): 03/13/2022. Results were: Normal Last colonoscopy: 09/01/2019. Results were: Benign polyps Last Dexa: 03/26/2021. Results were: T-score -1.3, FRAX 4.3% / 0.1%  Past medical history, past surgical history, family history and social history were all reviewed and documented in the EPIC chart. Divorced. On disability. Has son and daughter in [place], one son in [place], and one here (moved here from [place]).   ROS:  A ROS was performed and pertinent positives and negatives are included.  Exam:  Vitals:   11/06/22 1337  BP: 124/74  Pulse: 68  SpO2: 100%  Height: 5\' 4"  (1.626 m)    Body mass index is 42.5 kg/m.  General appearance:  Normal Thyroid:  Symmetrical, normal in size, without palpable masses or nodularity. Respiratory  Auscultation:  Clear without wheezing or rhonchi Cardiovascular  Auscultation:  Regular rate, without rubs, murmurs or gallops  Edema/varicosities:  Not grossly evident Abdominal  Soft,nontender, without masses, guarding or rebound.  Liver/spleen:  No organomegaly noted  Hernia:  None appreciated  Skin  Inspection:  Grossly normal   Breasts: Examined lying and sitting.   Right: No masses, redness,  swelling, nipple discharge, retractions  Left: Mastectomy Pelvic: External genitalia:  no lesions              Urethra:  normal appearing urethra with no masses, tenderness or lesions              Bartholins and Skenes: normal                 Vagina: normal appearing vagina with normal color and discharge, no lesions              Cervix: no lesions Bimanual Exam:  Uterus:  no masses or tenderness              Adnexa: no mass, fullness, tenderness              Rectovaginal: Deferred              Anus:  normal, no lesions  Patient informed chaperone available to be present for breast and pelvic exam. Patient has requested no chaperone to be present. Patient has been advised what will be completed during breast and pelvic exam.   Assessment/Plan:  62 y.o. G4 P4 presents for annual exam.   Well female exam with routine gynecological exam - Education provided on SBEs, importance of preventative screenings, current guidelines, high calcium diet, regular exercise, and multivitamin daily. Labs done with PCP.  Postmenopausal - no HRT, no bleeding  Osteopenia of spine - February 2023 T-score -1.3. Recommend Vitamin D + Calcium and regular exercise. Managed by PCP.   History of left breast cancer - 2005 and again July 2021 T2N1, stage IIA, ER/PR+ managed with mastectomy, chemotherapy (completed 06/2020), on Arimidex.  Being follow by oncology. UTD on screening mammogram (right only).   Screening for cervical cancer - Normal Pap history.  Will repeat at 5-year interval per guidelines.  Screening for colon cancer - 2021 colonoscopy. Will repeat at GI's recommended interval.   Follow up in 1 year for annual or sooner if needed.       Olivia Mackie [facility] 2:01 PM 11/06/2022

## 2023-01-27 DIAGNOSIS — C773 Secondary and unspecified malignant neoplasm of axilla and upper limb lymph nodes: Secondary | ICD-10-CM | POA: Diagnosis not present

## 2023-01-27 DIAGNOSIS — Z1159 Encounter for screening for other viral diseases: Secondary | ICD-10-CM | POA: Diagnosis not present

## 2023-01-27 DIAGNOSIS — F330 Major depressive disorder, recurrent, mild: Secondary | ICD-10-CM | POA: Diagnosis not present

## 2023-01-27 DIAGNOSIS — Z79899 Other long term (current) drug therapy: Secondary | ICD-10-CM | POA: Diagnosis not present

## 2023-01-27 DIAGNOSIS — E538 Deficiency of other specified B group vitamins: Secondary | ICD-10-CM | POA: Diagnosis not present

## 2023-01-27 DIAGNOSIS — R7303 Prediabetes: Secondary | ICD-10-CM | POA: Diagnosis not present

## 2023-01-27 DIAGNOSIS — E611 Iron deficiency: Secondary | ICD-10-CM | POA: Diagnosis not present

## 2023-03-16 ENCOUNTER — Ambulatory Visit
Admission: RE | Admit: 2023-03-16 | Discharge: 2023-03-16 | Disposition: A | Discharge status: Home / Self Care | Payer: Medicare (Managed Care) | Source: Ambulatory Visit | Attending: Hematology and Oncology | Admitting: Hematology and Oncology

## 2023-03-16 DIAGNOSIS — Z170 Estrogen receptor positive status [ER+]: Secondary | ICD-10-CM

## 2023-04-27 ENCOUNTER — Telehealth: Payer: Self-pay

## 2023-04-27 NOTE — Telephone Encounter (Signed)
 Left voicemail for patient about appointment on 04/28/23

## 2023-04-28 ENCOUNTER — Inpatient Hospital Stay: Payer: Medicare (Managed Care) | Attending: Hematology and Oncology | Admitting: Hematology and Oncology

## 2023-04-28 VITALS — BP 171/79 | HR 106 | Temp 98.4°F | Resp 16 | Wt 256.3 lb

## 2023-04-28 DIAGNOSIS — G620 Drug-induced polyneuropathy: Secondary | ICD-10-CM | POA: Diagnosis not present

## 2023-04-28 DIAGNOSIS — T451X5A Adverse effect of antineoplastic and immunosuppressive drugs, initial encounter: Secondary | ICD-10-CM

## 2023-04-28 DIAGNOSIS — Z170 Estrogen receptor positive status [ER+]: Secondary | ICD-10-CM

## 2023-04-28 DIAGNOSIS — C50412 Malignant neoplasm of upper-outer quadrant of left female breast: Secondary | ICD-10-CM

## 2023-04-28 DIAGNOSIS — C773 Secondary and unspecified malignant neoplasm of axilla and upper limb lymph nodes: Secondary | ICD-10-CM | POA: Insufficient documentation

## 2023-04-28 DIAGNOSIS — Z79811 Long term (current) use of aromatase inhibitors: Secondary | ICD-10-CM | POA: Diagnosis not present
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
## 2023-05-05 ENCOUNTER — Ambulatory Visit: Payer: Medicare (Managed Care) | Attending: Hematology and Oncology

## 2023-05-05 ENCOUNTER — Other Ambulatory Visit: Payer: Self-pay

## 2023-05-05 DIAGNOSIS — C50412 Malignant neoplasm of upper-outer quadrant of left female breast: Secondary | ICD-10-CM | POA: Insufficient documentation

## 2023-05-05 DIAGNOSIS — I890 Lymphedema, not elsewhere classified: Secondary | ICD-10-CM | POA: Insufficient documentation

## 2023-05-05 DIAGNOSIS — G620 Drug-induced polyneuropathy: Secondary | ICD-10-CM | POA: Insufficient documentation

## 2023-05-05 DIAGNOSIS — M25612 Stiffness of left shoulder, not elsewhere classified: Secondary | ICD-10-CM | POA: Insufficient documentation

## 2023-05-05 DIAGNOSIS — R0789 Other chest pain: Secondary | ICD-10-CM | POA: Diagnosis not present

## 2023-05-05 DIAGNOSIS — R293 Abnormal posture: Secondary | ICD-10-CM | POA: Insufficient documentation

## 2023-05-05 DIAGNOSIS — T451X5A Adverse effect of antineoplastic and immunosuppressive drugs, initial encounter: Secondary | ICD-10-CM | POA: Insufficient documentation

## 2023-05-05 DIAGNOSIS — Z170 Estrogen receptor positive status [ER+]: Secondary | ICD-10-CM | POA: Insufficient documentation

## 2023-05-05 NOTE — Patient Instructions (Signed)
 SHOULDER: Flexion - Supine (Cane)        Cancer Rehab [phone]    Hold cane in both hands. Raise arms up overhead. Do not allow back to arch. Hold _5__ seconds. Do __5__ times; __2__ times a day. Hands shoulder width apart Hands in Y position; further apart    Shoulder Blade Stretch    Clasp fingers behind head with elbows touching in front of face. Pull elbows back while pressing shoulder blades together. Relax and hold as tolerated, can place pillow under elbow here for comfort as needed and to allow for prolonged stretch.  Repeat __5__ times. Do __1-2__ sessions per day.   WALL SLIDES: Slide small finger side of hand up wall (stand sideways to wall) 5 x's gradually trying to go a little higher.  Copyright  VHI. All rights reserved.

## 2023-05-19 ENCOUNTER — Ambulatory Visit: Payer: Medicare (Managed Care) | Attending: Hematology and Oncology

## 2023-05-19 DIAGNOSIS — C50412 Malignant neoplasm of upper-outer quadrant of left female breast: Secondary | ICD-10-CM | POA: Insufficient documentation

## 2023-05-19 DIAGNOSIS — M25612 Stiffness of left shoulder, not elsewhere classified: Secondary | ICD-10-CM | POA: Insufficient documentation

## 2023-05-19 DIAGNOSIS — R0789 Other chest pain: Secondary | ICD-10-CM | POA: Insufficient documentation

## 2023-05-19 DIAGNOSIS — I890 Lymphedema, not elsewhere classified: Secondary | ICD-10-CM | POA: Insufficient documentation

## 2023-05-19 DIAGNOSIS — R293 Abnormal posture: Secondary | ICD-10-CM | POA: Diagnosis not present

## 2023-05-19 DIAGNOSIS — Z483 Aftercare following surgery for neoplasm: Secondary | ICD-10-CM | POA: Insufficient documentation
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
## 2023-05-21 ENCOUNTER — Ambulatory Visit: Payer: Medicare (Managed Care)

## 2023-05-26 ENCOUNTER — Ambulatory Visit: Payer: Medicare (Managed Care)

## 2023-05-26 DIAGNOSIS — I890 Lymphedema, not elsewhere classified: Secondary | ICD-10-CM

## 2023-05-26 DIAGNOSIS — R293 Abnormal posture: Secondary | ICD-10-CM

## 2023-05-26 DIAGNOSIS — R0789 Other chest pain: Secondary | ICD-10-CM

## 2023-05-26 DIAGNOSIS — M25612 Stiffness of left shoulder, not elsewhere classified: Secondary | ICD-10-CM
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
## 2023-05-28 ENCOUNTER — Ambulatory Visit: Payer: Medicare (Managed Care)

## 2023-05-28 DIAGNOSIS — Z483 Aftercare following surgery for neoplasm: Secondary | ICD-10-CM

## 2023-05-28 DIAGNOSIS — I890 Lymphedema, not elsewhere classified: Secondary | ICD-10-CM | POA: Diagnosis not present

## 2023-05-28 DIAGNOSIS — M25612 Stiffness of left shoulder, not elsewhere classified: Secondary | ICD-10-CM

## 2023-05-28 DIAGNOSIS — R0789 Other chest pain: Secondary | ICD-10-CM

## 2023-05-28 DIAGNOSIS — R293 Abnormal posture: Secondary | ICD-10-CM

## 2023-05-28 DIAGNOSIS — C50412 Malignant neoplasm of upper-outer quadrant of left female breast: Secondary | ICD-10-CM
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
## 2023-06-09 ENCOUNTER — Ambulatory Visit: Payer: Medicare (Managed Care)

## 2023-06-09 DIAGNOSIS — C50412 Malignant neoplasm of upper-outer quadrant of left female breast: Secondary | ICD-10-CM

## 2023-06-09 DIAGNOSIS — M25612 Stiffness of left shoulder, not elsewhere classified: Secondary | ICD-10-CM

## 2023-06-09 DIAGNOSIS — Z483 Aftercare following surgery for neoplasm: Secondary | ICD-10-CM

## 2023-06-09 DIAGNOSIS — I890 Lymphedema, not elsewhere classified: Secondary | ICD-10-CM

## 2023-06-09 DIAGNOSIS — R293 Abnormal posture: Secondary | ICD-10-CM

## 2023-06-09 DIAGNOSIS — R0789 Other chest pain: Secondary | ICD-10-CM
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
## 2023-06-11 ENCOUNTER — Ambulatory Visit: Payer: Medicare (Managed Care)

## 2023-06-11 DIAGNOSIS — I890 Lymphedema, not elsewhere classified: Secondary | ICD-10-CM | POA: Diagnosis not present

## 2023-06-11 DIAGNOSIS — M25612 Stiffness of left shoulder, not elsewhere classified: Secondary | ICD-10-CM

## 2023-06-11 DIAGNOSIS — R293 Abnormal posture: Secondary | ICD-10-CM

## 2023-06-11 DIAGNOSIS — C50412 Malignant neoplasm of upper-outer quadrant of left female breast: Secondary | ICD-10-CM

## 2023-06-11 DIAGNOSIS — R0789 Other chest pain: Secondary | ICD-10-CM

## 2023-06-11 DIAGNOSIS — Z483 Aftercare following surgery for neoplasm: Secondary | ICD-10-CM
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
## 2023-06-16 ENCOUNTER — Ambulatory Visit: Payer: Medicare (Managed Care)

## 2023-06-16 DIAGNOSIS — R293 Abnormal posture: Secondary | ICD-10-CM

## 2023-06-16 DIAGNOSIS — I890 Lymphedema, not elsewhere classified: Secondary | ICD-10-CM | POA: Diagnosis not present

## 2023-06-16 DIAGNOSIS — C50412 Malignant neoplasm of upper-outer quadrant of left female breast: Secondary | ICD-10-CM

## 2023-06-16 DIAGNOSIS — R0789 Other chest pain: Secondary | ICD-10-CM

## 2023-06-16 DIAGNOSIS — M25612 Stiffness of left shoulder, not elsewhere classified: Secondary | ICD-10-CM

## 2023-06-16 DIAGNOSIS — Z483 Aftercare following surgery for neoplasm: Secondary | ICD-10-CM
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
## 2023-06-18 ENCOUNTER — Ambulatory Visit: Payer: Medicare (Managed Care) | Attending: Hematology and Oncology

## 2023-06-18 DIAGNOSIS — R0789 Other chest pain: Secondary | ICD-10-CM | POA: Insufficient documentation

## 2023-06-18 DIAGNOSIS — I890 Lymphedema, not elsewhere classified: Secondary | ICD-10-CM | POA: Diagnosis not present

## 2023-06-18 DIAGNOSIS — R293 Abnormal posture: Secondary | ICD-10-CM | POA: Diagnosis not present

## 2023-06-18 DIAGNOSIS — Z483 Aftercare following surgery for neoplasm: Secondary | ICD-10-CM | POA: Diagnosis not present

## 2023-06-18 DIAGNOSIS — C50412 Malignant neoplasm of upper-outer quadrant of left female breast: Secondary | ICD-10-CM | POA: Insufficient documentation

## 2023-06-18 DIAGNOSIS — M25612 Stiffness of left shoulder, not elsewhere classified: Secondary | ICD-10-CM | POA: Diagnosis not present
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
## 2023-06-19 ENCOUNTER — Ambulatory Visit
Admission: RE | Admit: 2023-06-19 | Discharge: 2023-06-19 | Disposition: A | Discharge status: Home / Self Care | Payer: Medicare (Managed Care) | Source: Ambulatory Visit | Attending: Hematology and Oncology | Admitting: Hematology and Oncology

## 2023-06-19 DIAGNOSIS — G620 Drug-induced polyneuropathy: Secondary | ICD-10-CM

## 2023-06-19 DIAGNOSIS — N958 Other specified menopausal and perimenopausal disorders: Secondary | ICD-10-CM | POA: Diagnosis not present

## 2023-06-19 DIAGNOSIS — Z170 Estrogen receptor positive status [ER+]: Secondary | ICD-10-CM

## 2023-06-19 DIAGNOSIS — C50412 Malignant neoplasm of upper-outer quadrant of left female breast: Secondary | ICD-10-CM

## 2023-06-19 DIAGNOSIS — M8588 Other specified disorders of bone density and structure, other site: Secondary | ICD-10-CM | POA: Diagnosis not present

## 2023-06-23 ENCOUNTER — Ambulatory Visit: Payer: Medicare (Managed Care)

## 2023-06-23 DIAGNOSIS — R0789 Other chest pain: Secondary | ICD-10-CM

## 2023-06-23 DIAGNOSIS — M25612 Stiffness of left shoulder, not elsewhere classified: Secondary | ICD-10-CM

## 2023-06-23 DIAGNOSIS — Z483 Aftercare following surgery for neoplasm: Secondary | ICD-10-CM

## 2023-06-23 DIAGNOSIS — I890 Lymphedema, not elsewhere classified: Secondary | ICD-10-CM

## 2023-06-23 DIAGNOSIS — C50412 Malignant neoplasm of upper-outer quadrant of left female breast: Secondary | ICD-10-CM

## 2023-06-23 DIAGNOSIS — R293 Abnormal posture: Secondary | ICD-10-CM
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
## 2023-06-25 ENCOUNTER — Ambulatory Visit: Payer: Medicare (Managed Care)

## 2023-06-25 DIAGNOSIS — M25612 Stiffness of left shoulder, not elsewhere classified: Secondary | ICD-10-CM

## 2023-06-25 DIAGNOSIS — R293 Abnormal posture: Secondary | ICD-10-CM

## 2023-06-25 DIAGNOSIS — I890 Lymphedema, not elsewhere classified: Secondary | ICD-10-CM
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
## 2023-06-30 ENCOUNTER — Ambulatory Visit: Payer: Medicare (Managed Care)

## 2023-06-30 DIAGNOSIS — M25612 Stiffness of left shoulder, not elsewhere classified: Secondary | ICD-10-CM

## 2023-06-30 DIAGNOSIS — Z483 Aftercare following surgery for neoplasm: Secondary | ICD-10-CM

## 2023-06-30 DIAGNOSIS — I890 Lymphedema, not elsewhere classified: Secondary | ICD-10-CM | POA: Diagnosis not present

## 2023-06-30 DIAGNOSIS — R0789 Other chest pain: Secondary | ICD-10-CM

## 2023-06-30 DIAGNOSIS — R293 Abnormal posture: Secondary | ICD-10-CM

## 2023-06-30 DIAGNOSIS — C50412 Malignant neoplasm of upper-outer quadrant of left female breast: Secondary | ICD-10-CM
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
## 2023-07-02 ENCOUNTER — Ambulatory Visit: Payer: Self-pay | Admitting: *Deleted

## 2023-07-02 ENCOUNTER — Ambulatory Visit: Payer: Medicare (Managed Care)

## 2023-07-02 DIAGNOSIS — I890 Lymphedema, not elsewhere classified: Secondary | ICD-10-CM | POA: Diagnosis not present

## 2023-07-02 DIAGNOSIS — R293 Abnormal posture: Secondary | ICD-10-CM

## 2023-07-02 DIAGNOSIS — R0789 Other chest pain: Secondary | ICD-10-CM

## 2023-07-02 DIAGNOSIS — C50412 Malignant neoplasm of upper-outer quadrant of left female breast: Secondary | ICD-10-CM

## 2023-07-02 DIAGNOSIS — M25612 Stiffness of left shoulder, not elsewhere classified: Secondary | ICD-10-CM
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
## 2023-07-06 DIAGNOSIS — C50412 Malignant neoplasm of upper-outer quadrant of left female breast: Secondary | ICD-10-CM | POA: Diagnosis not present

## 2023-07-06 DIAGNOSIS — R7303 Prediabetes: Secondary | ICD-10-CM | POA: Diagnosis not present

## 2023-07-06 DIAGNOSIS — K5900 Constipation, unspecified: Secondary | ICD-10-CM | POA: Diagnosis not present

## 2023-07-06 DIAGNOSIS — Z79899 Other long term (current) drug therapy: Secondary | ICD-10-CM | POA: Diagnosis not present

## 2023-07-06 DIAGNOSIS — I972 Postmastectomy lymphedema syndrome: Secondary | ICD-10-CM | POA: Diagnosis not present

## 2023-07-06 DIAGNOSIS — F330 Major depressive disorder, recurrent, mild: Secondary | ICD-10-CM | POA: Diagnosis not present

## 2023-07-06 DIAGNOSIS — C773 Secondary and unspecified malignant neoplasm of axilla and upper limb lymph nodes: Secondary | ICD-10-CM | POA: Diagnosis not present

## 2023-07-06 DIAGNOSIS — Z0001 Encounter for general adult medical examination with abnormal findings: Secondary | ICD-10-CM | POA: Diagnosis not present

## 2023-07-06 DIAGNOSIS — Z170 Estrogen receptor positive status [ER+]: Secondary | ICD-10-CM | POA: Diagnosis not present

## 2023-07-06 DIAGNOSIS — G4733 Obstructive sleep apnea (adult) (pediatric): Secondary | ICD-10-CM | POA: Diagnosis not present

## 2023-07-06 DIAGNOSIS — Z139 Encounter for screening, unspecified: Secondary | ICD-10-CM | POA: Diagnosis not present

## 2023-09-08 DIAGNOSIS — H938X1 Other specified disorders of right ear: Secondary | ICD-10-CM | POA: Diagnosis not present

## 2023-09-20 IMAGING — DX DG HAND COMPLETE 3+V*R*
3 series · 3 of 3 positions shown · non-contrast
Comparison: None.

CLINICAL DATA: Bilateral pain in the hands.

EXAM:
RIGHT HAND - COMPLETE 3+ VIEW; LEFT HAND - COMPLETE 3+ VIEW

[hand pa]
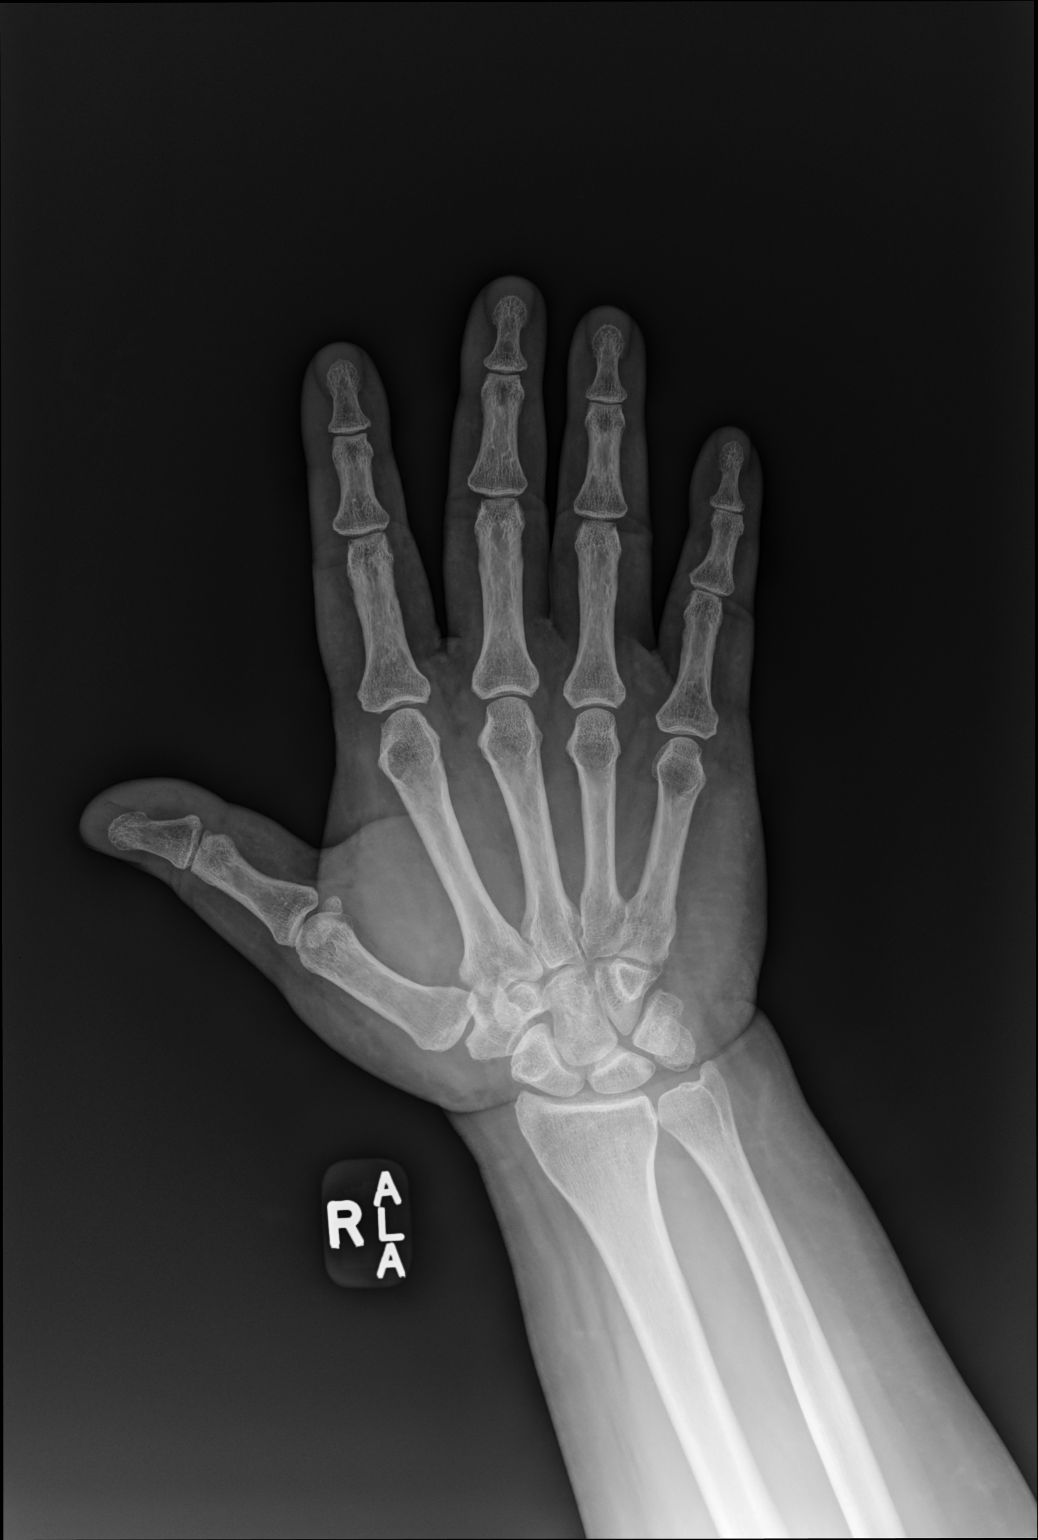

[hand mlo]
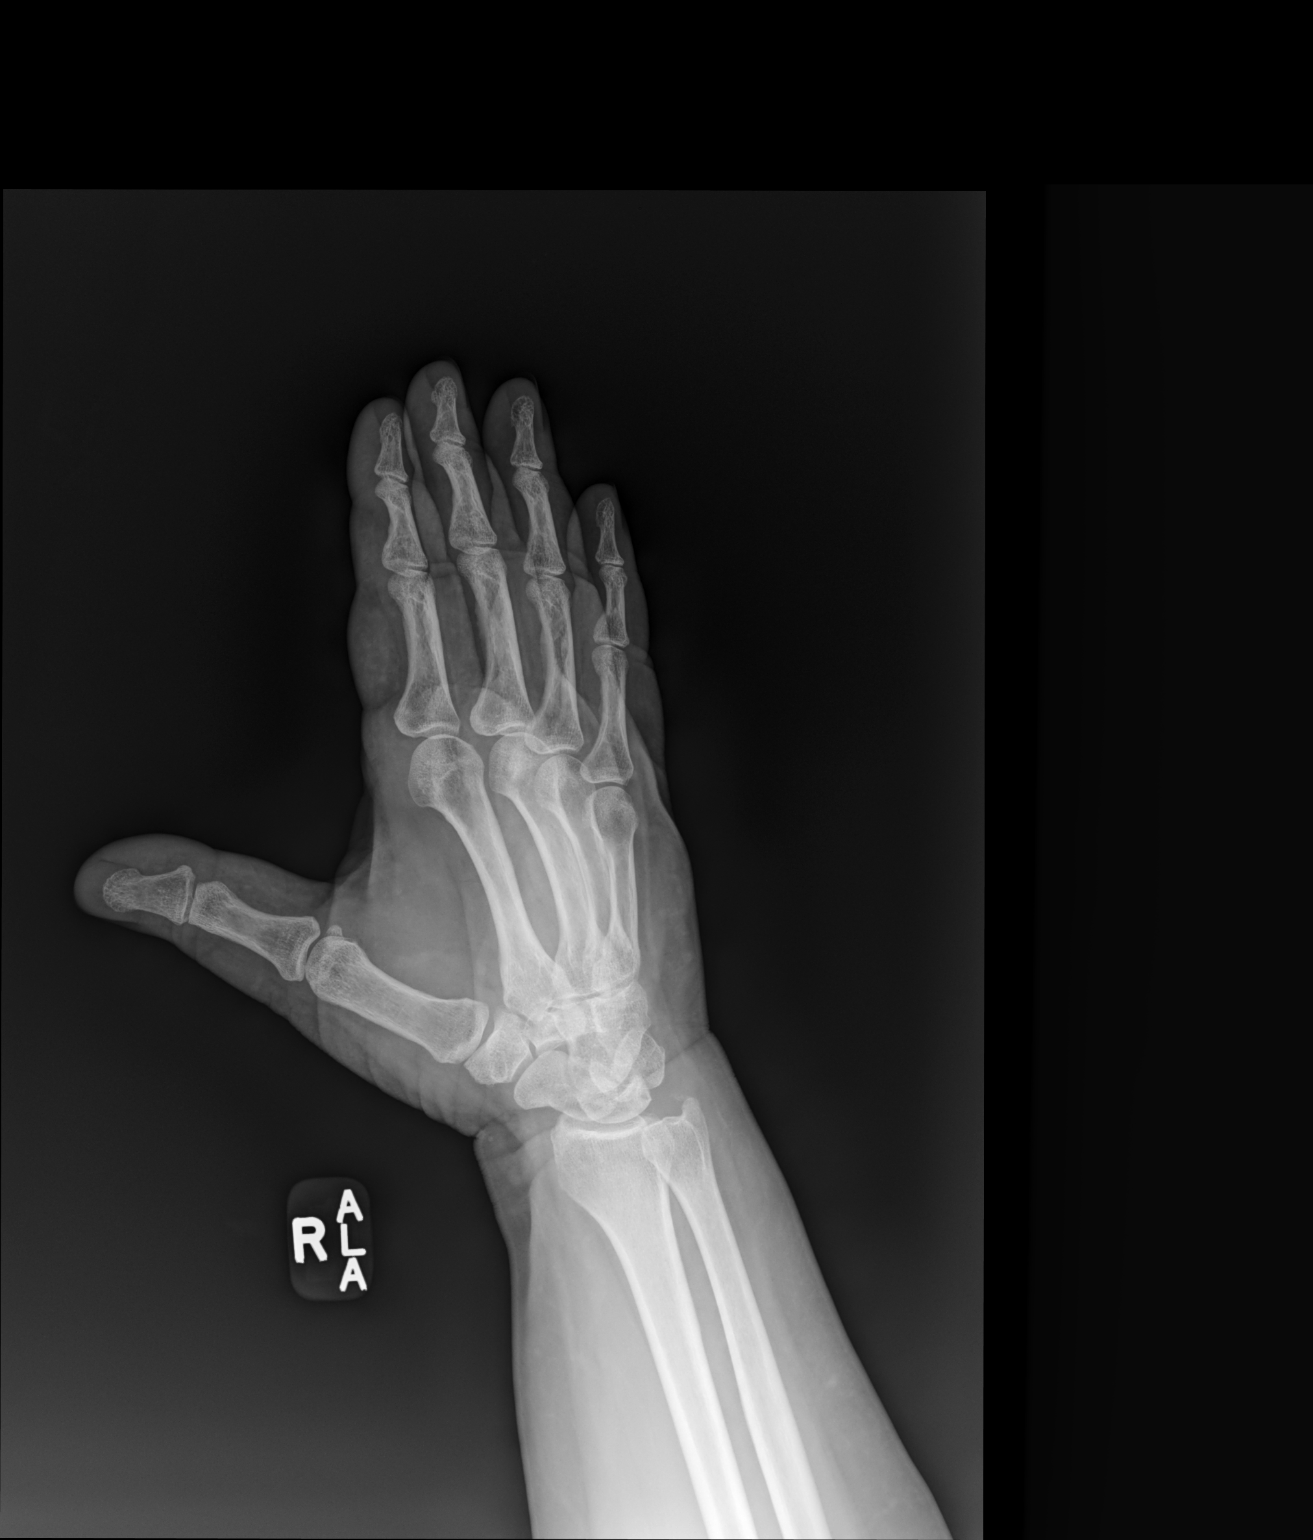

[hand lat]
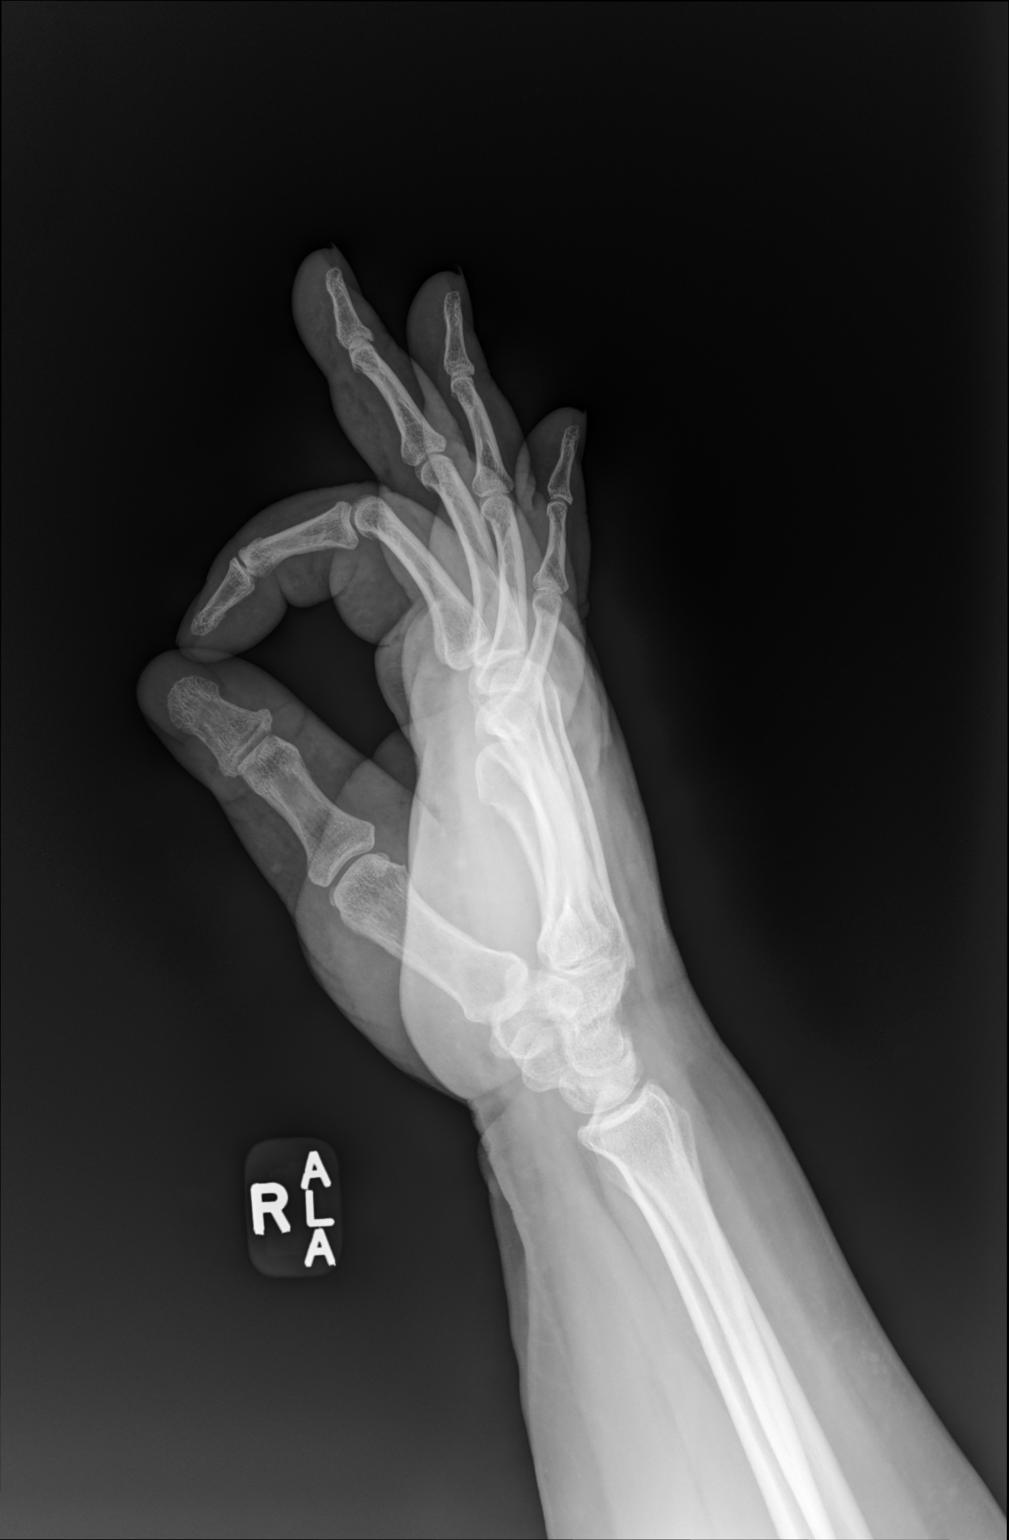

[3 of 3 positions shown; findings below may reference images not displayed]

FINDINGS: There is no evidence of fracture or dislocation. There is no
evidence of significant arthropathy or other focal bone abnormality.
Soft tissues are unremarkable. There is a chronic healed fracture
deformity of the left fourth metacarpal mid shaft.
IMPRESSION: No acute or significant radiographic findings .

## 2023-09-20 IMAGING — DX DG HAND COMPLETE 3+V*L*
3 series · 3 of 3 positions shown · non-contrast
Comparison: None.

CLINICAL DATA: Bilateral pain in the hands.

EXAM:
RIGHT HAND - COMPLETE 3+ VIEW; LEFT HAND - COMPLETE 3+ VIEW

[hand pa]
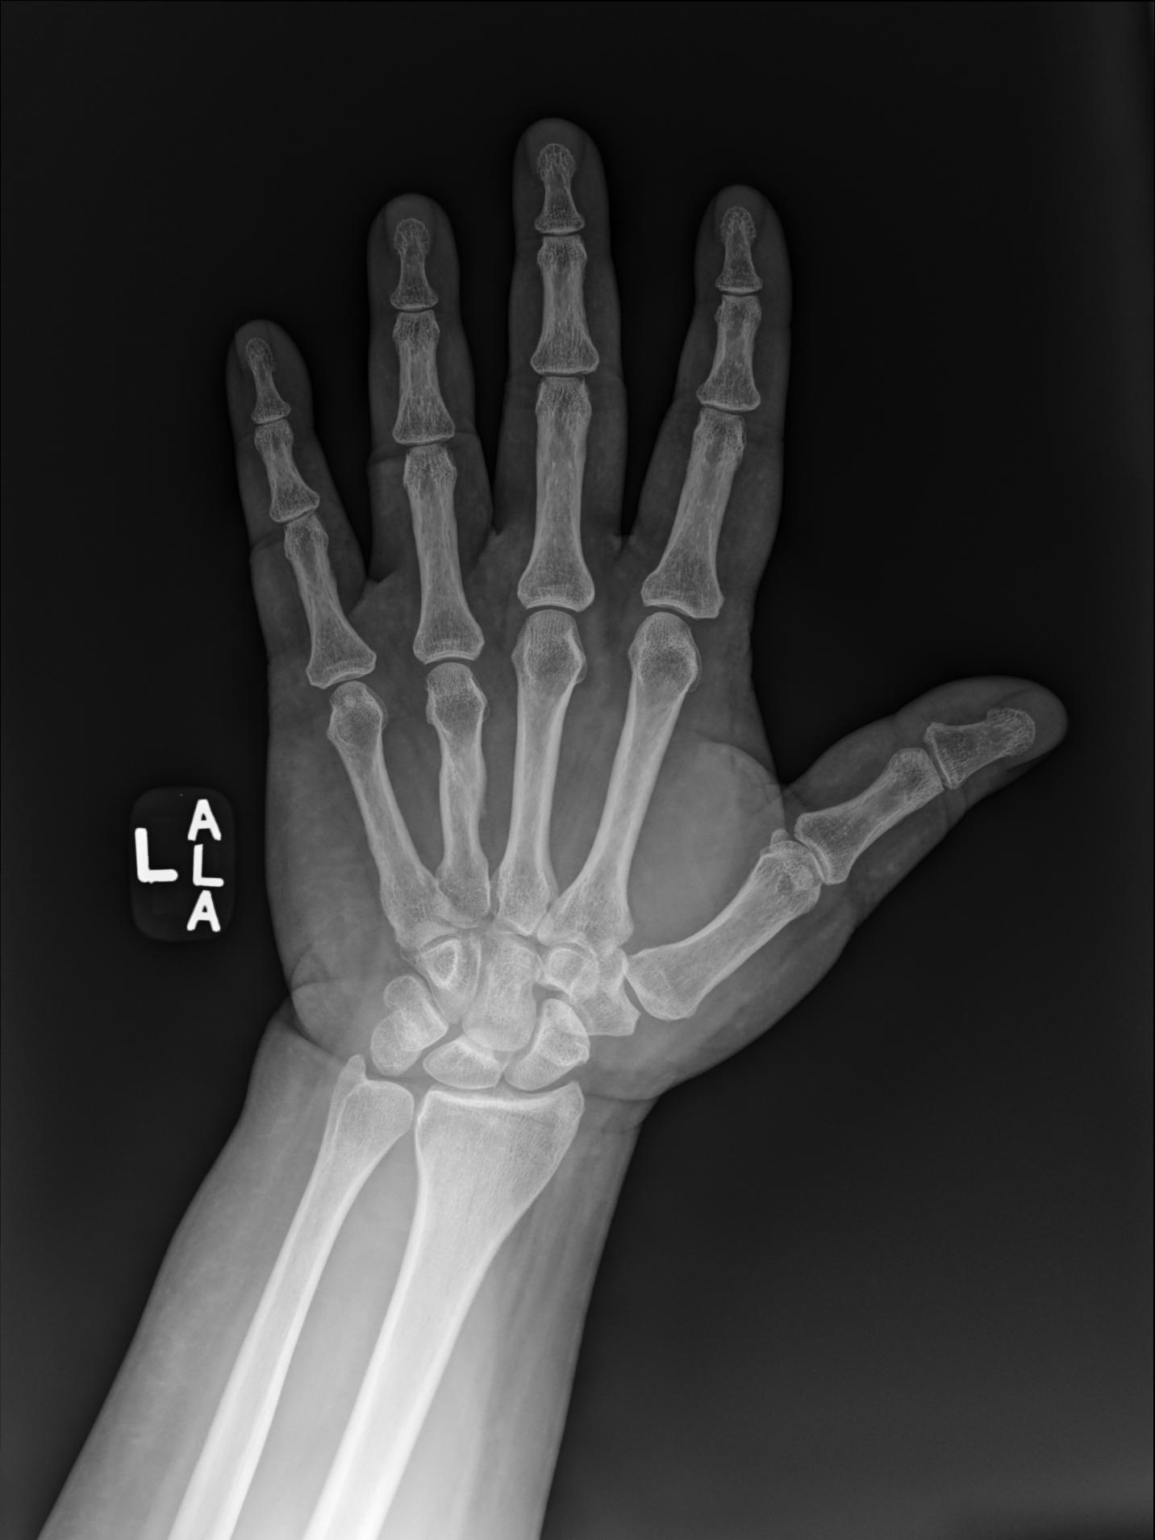

[hand mlo]
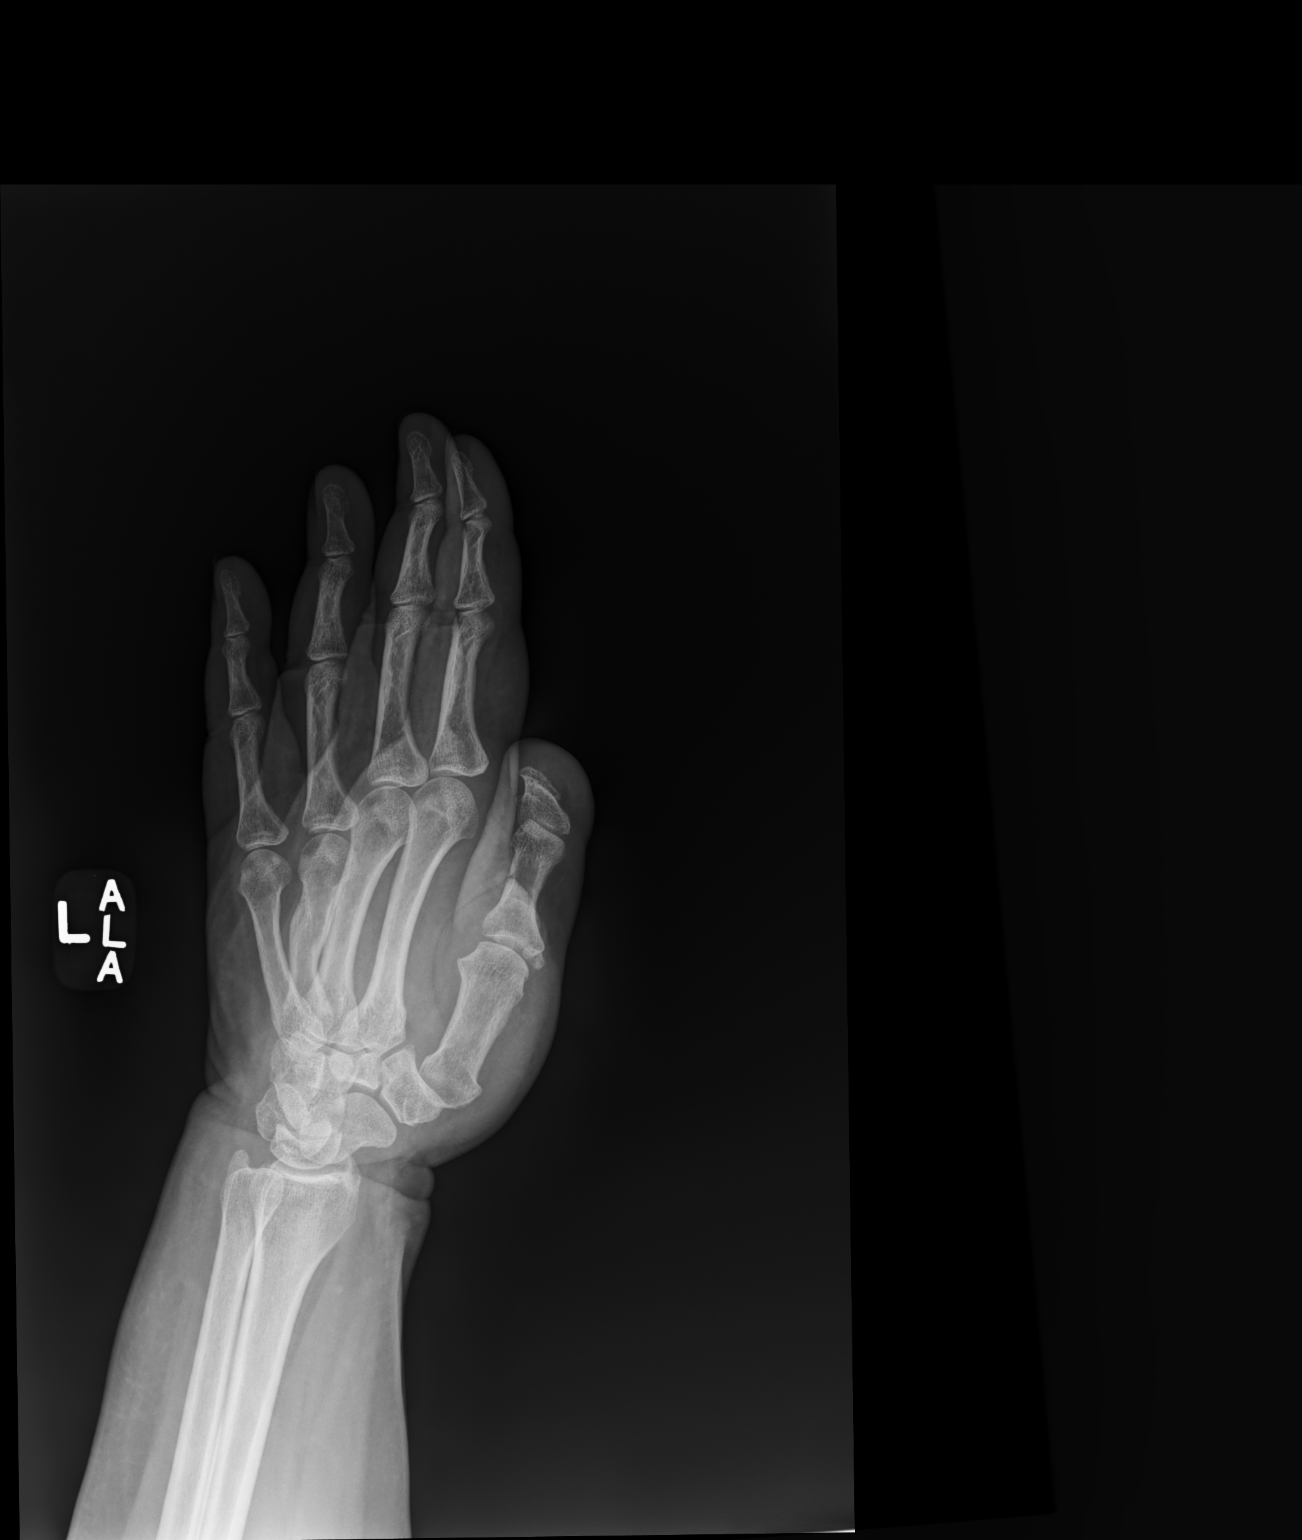

[hand lat]
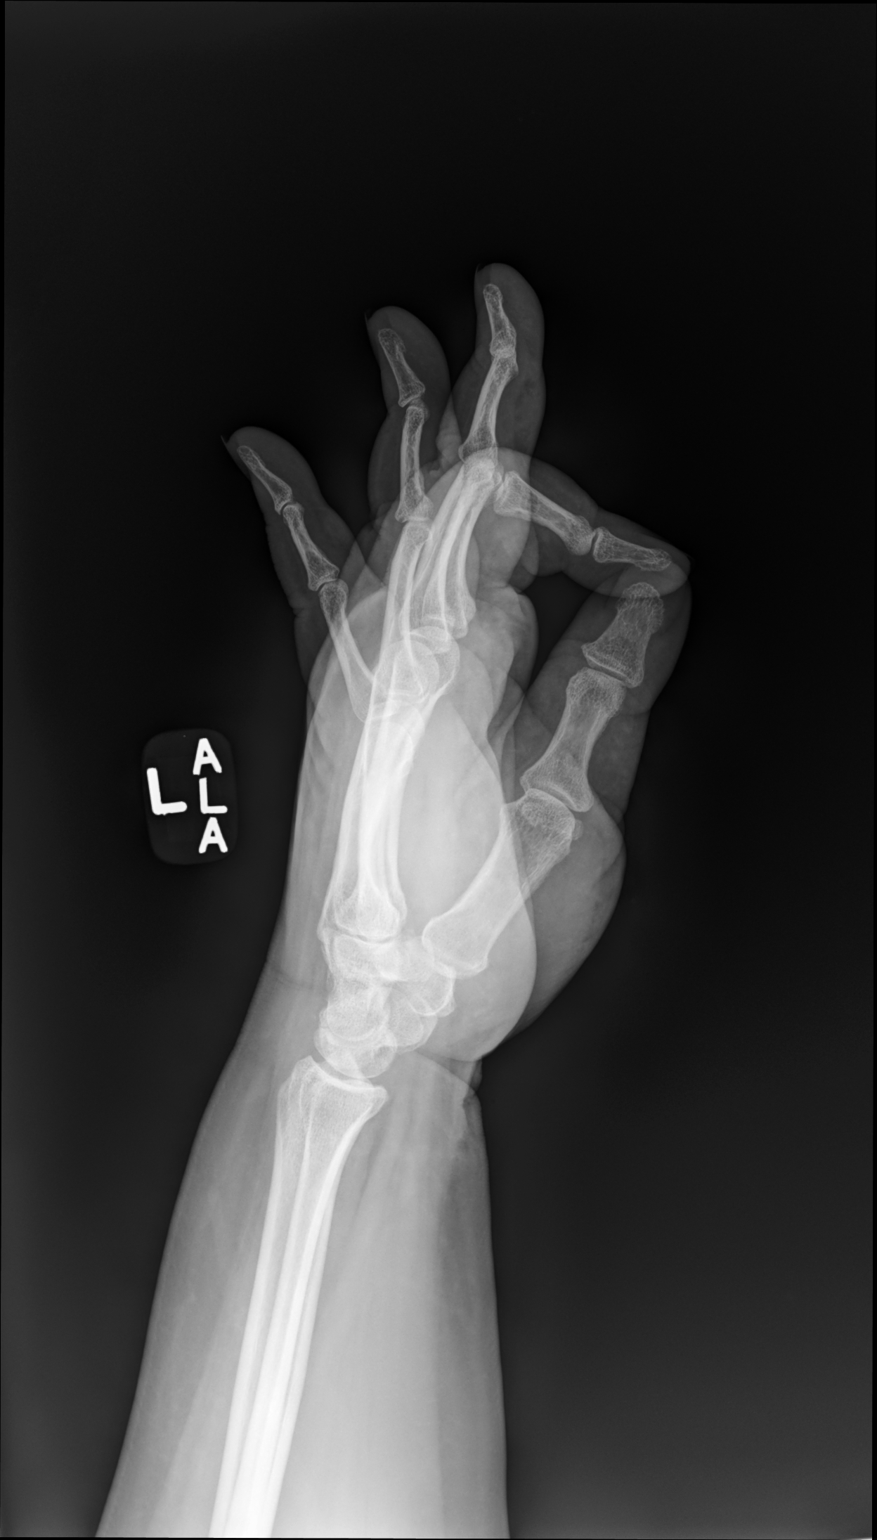

[3 of 3 positions shown; findings below may reference images not displayed]

FINDINGS: There is no evidence of fracture or dislocation. There is no
evidence of significant arthropathy or other focal bone abnormality.
Soft tissues are unremarkable. There is a chronic healed fracture
deformity of the left fourth metacarpal mid shaft.
IMPRESSION: No acute or significant radiographic findings .

## 2023-11-11 ENCOUNTER — Encounter: Payer: Medicare (Managed Care) | Admitting: Nurse Practitioner

## 2023-11-11 NOTE — Progress Notes (Deleted)
 Lauren Garrison 12-10-1960 [id number]   History:  63 y.o. G4 P4 presents for annual exam without GYN complaints. Postmenopausal - no HRT, no bleeding. Normal pap history. 2005 left breast cancer and again July 2021 ER+/PR+ managed with left mastectomy and chemotherapy (completed 06/2020), on Arimidex . PMB x 2 episodes - 06/2021 U/S showed thickened endometrium, EMB - benign inactive atrophic endometrium. Hysteroscopy, D&C & polypectomy 07/2021. No bleeding since. HTN, T2DM managed by PCP.   Gynecologic History No LMP recorded. Patient is postmenopausal.   Sexually active: No  Health maintenance Last Pap: 08/03/2019 Results were: Normal neg HPV, 5-year repeat Last mammogram (right only): 03/16/2023. Results were: Normal Last colonoscopy: 09/01/2019. Results were: Benign polyps Last Dexa: 06/19/2023. Results were: T-score -1.6, FRAX 5.3% / 0.4%     10/08/2021   11:33 AM  Depression screen PHQ 2/9  Decreased Interest 3  Down, Depressed, Hopeless 3  PHQ - 2 Score 6  Altered sleeping 3  Tired, decreased energy 2  Change in appetite 1  Feeling bad or failure about yourself  3  Trouble concentrating 2  Moving slowly or fidgety/restless 0  Suicidal thoughts 2  PHQ-9 Score 19  Difficult doing work/chores Very difficult     Past medical history, past surgical history, family history and social history were all reviewed and documented in the EPIC chart. Divorced. On disability. Has son and daughter in [place], one son in [place], and one here (moved here from [place]).   ROS:  A ROS was performed and pertinent positives and negatives are included.  Exam:  There were no vitals filed for this visit.   There is no height or weight on file to calculate BMI.  General appearance:  Normal Thyroid :  Symmetrical, normal in size, without palpable masses or nodularity. Respiratory  Auscultation:  Clear without wheezing or rhonchi Cardiovascular  Auscultation:  Regular rate, without rubs, murmurs or  gallops  Edema/varicosities:  Not grossly evident Abdominal  Soft,nontender, without masses, guarding or rebound.  Liver/spleen:  No organomegaly noted  Hernia:  None appreciated  Skin  Inspection:  Grossly normal   Breasts: Examined lying and sitting.   Right: No masses, redness, swelling, nipple discharge, retractions  Left: Mastectomy Pelvic: External genitalia:  no lesions              Urethra:  normal appearing urethra with no masses, tenderness or lesions              Bartholins and Skenes: normal                 Vagina: normal appearing vagina with normal color and discharge, no lesions              Cervix: no lesions Bimanual Exam:  Uterus:  no masses or tenderness              Adnexa: no mass, fullness, tenderness              Rectovaginal: Deferred              Anus:  normal, no lesions   Assessment/Plan:  63 y.o. G4 P4 presents for annual exam.   Well female exam with routine gynecological exam - Education provided on SBEs, importance of preventative screenings, current guidelines, high calcium  diet, regular exercise, and multivitamin daily. Labs done with PCP.  Postmenopausal - no HRT, no bleeding  Osteopenia of spine - 06/2023 T-score -1.6 without elevated FRAX. Recommend Vitamin D  + Calcium  and regular exercise.  Managed by PCP.   History of left breast cancer - 2005 and again July 2021 T2N1, stage IIA, ER/PR+ managed with mastectomy, chemotherapy (completed 06/2020), on Arimidex . Being follow by oncology. UTD on screening mammogram (right only).   Screening for cervical cancer - Normal Pap history.  Will repeat at 5-year interval per guidelines.  Screening for colon cancer - 2021 colonoscopy. Will repeat at GI's recommended interval.   No follow-ups on file.       Lauren Garrison [facility], 11:38 AM 11/11/2023

## 2023-11-16 ENCOUNTER — Telehealth: Payer: Self-pay | Admitting: *Deleted

## 2023-11-16 ENCOUNTER — Ambulatory Visit: Payer: Medicare (Managed Care) | Admitting: Nurse Practitioner

## 2023-11-16 ENCOUNTER — Encounter: Payer: Self-pay | Admitting: Nurse Practitioner

## 2023-11-16 VITALS — BP 118/76 | HR 80 | Resp 16

## 2023-11-16 DIAGNOSIS — Z780 Asymptomatic menopausal state: Secondary | ICD-10-CM

## 2023-11-16 DIAGNOSIS — F332 Major depressive disorder, recurrent severe without psychotic features: Secondary | ICD-10-CM

## 2023-11-16 DIAGNOSIS — Z9189 Other specified personal risk factors, not elsewhere classified: Secondary | ICD-10-CM

## 2023-11-16 DIAGNOSIS — Z9289 Personal history of other medical treatment: Secondary | ICD-10-CM | POA: Diagnosis not present

## 2023-11-16 DIAGNOSIS — N644 Mastodynia: Secondary | ICD-10-CM

## 2023-11-16 DIAGNOSIS — Z79818 Long term (current) use of other agents affecting estrogen receptors and estrogen levels: Secondary | ICD-10-CM | POA: Diagnosis not present

## 2023-11-16 DIAGNOSIS — Z853 Personal history of malignant neoplasm of breast: Secondary | ICD-10-CM

## 2023-11-16 DIAGNOSIS — M8588 Other specified disorders of bone density and structure, other site: Secondary | ICD-10-CM

## 2023-11-16 DIAGNOSIS — Z1331 Encounter for screening for depression: Secondary | ICD-10-CM | POA: Diagnosis not present

## 2023-11-16 DIAGNOSIS — Z01419 Encounter for gynecological examination (general) (routine) without abnormal findings: Secondary | ICD-10-CM

## 2023-11-16 NOTE — Progress Notes (Addendum)
 Lauren Garrison [birth year] [id number]   History:  63 y.o. G4 P4 presents for breast and pelvic exam without GYN complaints. Postmenopausal - no HRT, no bleeding. Normal pap history. 2005 left breast cancer and again July 2021 ER+/PR+ managed with left mastectomy and chemotherapy (completed 06/2020), on Arimidex . PMB x 2 episodes - 06/2021 U/S showed thickened endometrium, EMB - benign inactive atrophic endometrium. Hysteroscopy, D&C & polypectomy 07/2021. No bleeding since. HTN, T2DM managed by PCP.   Gynecologic History No LMP recorded. Patient is postmenopausal.   Sexually active: No  Health maintenance Last Pap: 08/03/2019. Results were: Normal neg HPV Last mammogram (right only): 03/16/2023. Results were: Normal Last colonoscopy: 09/01/2019. Results were: Benign polyps Last Dexa: 06/19/2023. Results were: T-score -1.6, FRAX 5.3% / 0.4%     10/08/2021   11:33 AM  Depression screen PHQ 2/9  Decreased Interest 3  Down, Depressed, Hopeless 3  PHQ - 2 Score 6  Altered sleeping 3  Tired, decreased energy 2  Change in appetite 1  Feeling bad or failure about yourself  3  Trouble concentrating 2  Moving slowly or fidgety/restless 0  Suicidal thoughts 2  PHQ-9 Score 19  Difficult doing work/chores Very difficult     Past medical history, past surgical history, family history and social history were all reviewed and documented in the EPIC chart. Divorced. On disability. Has son and daughter in [place], one son in [place], and one here (moved here from [place]). Grandson just moved here too.   ROS:  A ROS was performed and pertinent positives and negatives are included.  Exam:  Vitals:   11/16/23 1538  BP: 118/76  Pulse: 80  Resp: 16     There is no height or weight on file to calculate BMI.  General appearance:  Normal Thyroid :  Symmetrical, normal in size, without palpable masses or nodularity. Respiratory  Auscultation:  Clear without wheezing or  rhonchi Cardiovascular  Auscultation:  Regular rate, without rubs, murmurs or gallops  Edema/varicosities:  Not grossly evident Abdominal  Soft,nontender, without masses, guarding or rebound.  Liver/spleen:  No organomegaly noted  Hernia:  None appreciated  Skin  Inspection:  Grossly normal   Breasts: Examined lying and sitting.   Right: No masses, redness, swelling, nipple discharge, retractions. + pain  Left: Mastectomy Pelvic: External genitalia:  no lesions              Urethra:  normal appearing urethra with no masses, tenderness or lesions              Bartholins and Skenes: normal                 Vagina: normal appearing vagina with normal color and discharge, no lesions              Cervix: no lesions Bimanual Exam:  Uterus:  no masses or tenderness              Adnexa: no mass, fullness, tenderness              Rectovaginal: Deferred              Anus:  normal, no lesions Lauren Garrison, CMA present as chaperone.   Assessment/Plan:  63 y.o. G4 P4 presents for annual exam.   Encounter for breast and pelvic examination - Education provided on SBEs, importance of preventative screenings, current guidelines, high calcium  diet, regular exercise, and multivitamin daily. Labs done with PCP.  Postmenopausal - no HRT, no  bleeding  Osteopenia of spine - 06/2023 T-score -1.6 without elevated FRAX. Recommend Vitamin D  + Calcium  and regular exercise. Managed by PCP.   History of left breast cancer - 2005 and again July 2021 T2N1, stage IIA, ER/PR+ managed with mastectomy, chemotherapy (completed 06/2020), on Arimidex . Being follow by oncology. UTD on screening mammogram (right only).   Severe episode of recurrent major depressive disorder, without psychotic features (HCC) - PHQ 19. Denies SI/HI. Says most days are good. Takes Effexor .   Pain of right breast - will send for diagnostic imaging. Pain is new.  Screening for cervical cancer - Normal Pap history.  Will repeat at 5-year  interval per guidelines.  Screening for colon cancer - 2021 colonoscopy. Will repeat at GI's recommended interval.   Return in about 1 year (around 11/15/2024) for B&P (high risk).       Lauren Garrison [facility], 4:31 PM 11/16/2023

## 2023-11-16 NOTE — Telephone Encounter (Signed)
-----   Message from Nurse Kate DEL sent at 11/16/2023  4:43 PM EDT ----- Regarding: FW: Diagnostic imaging  ----- Message ----- From: Prentiss Annabella LABOR, NP Sent: 11/16/2023   4:31 PM EDT To: [facility] Triage Subject: Diagnostic imaging                             Please send order for diagnostic imaging of right breast for pain. H/O breast cancer.

## 2023-11-17 NOTE — Telephone Encounter (Signed)
 Spoke with patient to confirm availability prior to scheduling. Patient request to schedule early AM Mondays or Fridays.   Spoke with Ragena at [facility], patient scheduled for Right breast Dx MMG and US  on 11/30/23 at 0900.   Patient notified.   Routing to provider for final review. Patient is agreeable to disposition. Will close encounter.

## 2023-11-30 ENCOUNTER — Ambulatory Visit
Admission: RE | Admit: 2023-11-30 | Discharge: 2023-11-30 | Disposition: A | Payer: Medicare (Managed Care) | Source: Ambulatory Visit | Attending: Nurse Practitioner

## 2023-11-30 ENCOUNTER — Ambulatory Visit: Payer: Medicare (Managed Care)

## 2023-11-30 DIAGNOSIS — Z853 Personal history of malignant neoplasm of breast: Secondary | ICD-10-CM

## 2023-11-30 DIAGNOSIS — N644 Mastodynia: Secondary | ICD-10-CM

## 2023-11-30 DIAGNOSIS — R928 Other abnormal and inconclusive findings on diagnostic imaging of breast: Secondary | ICD-10-CM | POA: Diagnosis not present

## 2023-12-01 ENCOUNTER — Ambulatory Visit: Payer: Self-pay | Admitting: Nurse Practitioner

## 2024-01-07 DIAGNOSIS — K760 Fatty (change of) liver, not elsewhere classified: Secondary | ICD-10-CM | POA: Diagnosis not present

## 2024-01-07 DIAGNOSIS — Z79899 Other long term (current) drug therapy: Secondary | ICD-10-CM | POA: Diagnosis not present

## 2024-01-07 DIAGNOSIS — K5900 Constipation, unspecified: Secondary | ICD-10-CM | POA: Diagnosis not present

## 2024-01-07 DIAGNOSIS — M4316 Spondylolisthesis, lumbar region: Secondary | ICD-10-CM | POA: Diagnosis not present

## 2024-01-07 DIAGNOSIS — Z853 Personal history of malignant neoplasm of breast: Secondary | ICD-10-CM | POA: Diagnosis not present

## 2024-01-07 DIAGNOSIS — M47816 Spondylosis without myelopathy or radiculopathy, lumbar region: Secondary | ICD-10-CM | POA: Diagnosis not present

## 2024-01-07 DIAGNOSIS — Z9851 Tubal ligation status: Secondary | ICD-10-CM | POA: Diagnosis not present

## 2024-01-07 DIAGNOSIS — Z882 Allergy status to sulfonamides status: Secondary | ICD-10-CM | POA: Diagnosis not present

## 2024-01-07 DIAGNOSIS — Z888 Allergy status to other drugs, medicaments and biological substances status: Secondary | ICD-10-CM | POA: Diagnosis not present

## 2024-01-07 DIAGNOSIS — D259 Leiomyoma of uterus, unspecified: Secondary | ICD-10-CM | POA: Diagnosis not present

## 2024-01-07 DIAGNOSIS — I10 Essential (primary) hypertension: Secondary | ICD-10-CM | POA: Diagnosis not present

## 2024-01-07 DIAGNOSIS — R1011 Right upper quadrant pain: Secondary | ICD-10-CM | POA: Diagnosis not present

## 2024-01-07 DIAGNOSIS — K5730 Diverticulosis of large intestine without perforation or abscess without bleeding: Secondary | ICD-10-CM | POA: Diagnosis not present

## 2024-01-11 DIAGNOSIS — I10 Essential (primary) hypertension: Secondary | ICD-10-CM | POA: Diagnosis not present

## 2024-01-11 DIAGNOSIS — Z139 Encounter for screening, unspecified: Secondary | ICD-10-CM | POA: Diagnosis not present

## 2024-01-11 DIAGNOSIS — K5900 Constipation, unspecified: Secondary | ICD-10-CM | POA: Diagnosis not present

## 2024-01-11 DIAGNOSIS — Z8719 Personal history of other diseases of the digestive system: Secondary | ICD-10-CM | POA: Diagnosis not present
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
## 2024-01-25 ENCOUNTER — Encounter: Payer: Self-pay | Admitting: Physician Assistant

## 2024-01-25 ENCOUNTER — Ambulatory Visit: Payer: Medicare (Managed Care) | Admitting: Physician Assistant

## 2024-01-25 VITALS — BP 148/90 | HR 92 | Ht 65.0 in | Wt 252.0 lb

## 2024-01-25 DIAGNOSIS — Z860101 Personal history of adenomatous and serrated colon polyps: Secondary | ICD-10-CM | POA: Diagnosis not present

## 2024-01-25 DIAGNOSIS — K5900 Constipation, unspecified: Secondary | ICD-10-CM | POA: Diagnosis not present

## 2024-01-25 DIAGNOSIS — Z860100 Personal history of colon polyps, unspecified: Secondary | ICD-10-CM

## 2024-01-25 NOTE — Patient Instructions (Addendum)
   For constipation: Start OTC Miralax  Powder Mix 1 capful in 6 to 8 ounces of a drink once daily  Recommend high-fiber diet, 30 g of fiber daily Eat fruits, vegetables, and whole grains Drink 64 ounces of water / fluids daily.   Please follow up sooner if symptoms increase or worsen  Due to recent changes in healthcare laws, you may see the results of your imaging and laboratory studies on MyChart before your provider has had a chance to review them.  We understand that in some cases there may be results that are confusing or concerning to you. Not all laboratory results come back in the same time frame and the provider may be waiting for multiple results in order to interpret others.  Please give us  48 hours in order for your provider to thoroughly review all the results before contacting the office for clarification of your results.   Thank you for trusting me with your gastrointestinal care!   Ellouise Console, PA-C _______________________________________________________  If your blood pressure at your visit was 140/90 or greater, please contact your primary care physician to follow up on this.  _______________________________________________________  If you are age 69 or older, your body mass index should be between 23-30. Your Body mass index is 41.93 kg/m. If this is out of the aforementioned range listed, please consider follow up with your Primary Care Provider.  If you are age 34 or younger, your body mass index should be between 19-25. Your Body mass index is 41.93 kg/m. If this is out of the aformentioned range listed, please consider follow up with your Primary Care Provider.   ________________________________________________________  The [facility] providers would like to encourage you to use MYCHART to communicate with providers for non-urgent requests or questions.  Due to long hold times on the telephone, sending your provider a message by [facility] may be a faster and more  efficient way to get a response.  Please allow 48 business hours for a response.  Please remember that this is for non-urgent requests.  _______________________________________________________

## 2024-01-25 NOTE — Progress Notes (Signed)
 Agree with assessment / plan as outlined.

## 2024-03-23 ENCOUNTER — Other Ambulatory Visit: Payer: Self-pay | Admitting: Family Medicine

## 2024-03-23 DIAGNOSIS — Z1231 Encounter for screening mammogram for malignant neoplasm of breast: Secondary | ICD-10-CM

## 2024-04-07 ENCOUNTER — Ambulatory Visit: Payer: Medicare (Managed Care)

## 2024-04-28 ENCOUNTER — Ambulatory Visit: Payer: Medicare (Managed Care) | Admitting: Hematology and Oncology
